# Patient Record
Sex: Male | Born: 1940 | Race: Black or African American | Hispanic: No | Marital: Married | Smoking: Never smoker
Health system: Southern US, Community
[De-identification: ages and names within clinical notes are randomized; demographics above are authoritative.]

## PROBLEM LIST (undated history)

## (undated) DIAGNOSIS — M159 Polyosteoarthritis, unspecified: Secondary | ICD-10-CM

## (undated) DIAGNOSIS — R531 Weakness: Secondary | ICD-10-CM

## (undated) DIAGNOSIS — M150 Primary generalized (osteo)arthritis: Secondary | ICD-10-CM

## (undated) DIAGNOSIS — E785 Hyperlipidemia, unspecified: Secondary | ICD-10-CM

## (undated) DIAGNOSIS — E876 Hypokalemia: Secondary | ICD-10-CM

## (undated) DIAGNOSIS — N400 Enlarged prostate without lower urinary tract symptoms: Secondary | ICD-10-CM

## (undated) DIAGNOSIS — I10 Essential (primary) hypertension: Secondary | ICD-10-CM

## (undated) DIAGNOSIS — I639 Cerebral infarction, unspecified: Secondary | ICD-10-CM

## (undated) DIAGNOSIS — M8949 Other hypertrophic osteoarthropathy, multiple sites: Secondary | ICD-10-CM

## (undated) DIAGNOSIS — R9431 Abnormal electrocardiogram [ECG] [EKG]: Secondary | ICD-10-CM

## (undated) DIAGNOSIS — I517 Cardiomegaly: Secondary | ICD-10-CM

## (undated) DIAGNOSIS — G459 Transient cerebral ischemic attack, unspecified: Secondary | ICD-10-CM

## (undated) DIAGNOSIS — E7800 Pure hypercholesterolemia, unspecified: Secondary | ICD-10-CM

## (undated) DIAGNOSIS — E119 Type 2 diabetes mellitus without complications: Secondary | ICD-10-CM

## (undated) DIAGNOSIS — D239 Other benign neoplasm of skin, unspecified: Secondary | ICD-10-CM

## (undated) HISTORY — DX: Other benign neoplasm of skin, unspecified: D23.9

## (undated) HISTORY — DX: Type 2 diabetes mellitus without complications: E11.9

## (undated) HISTORY — DX: Cerebral infarction, unspecified: I63.9

## (undated) HISTORY — DX: Weakness: R53.1

## (undated) HISTORY — DX: Pure hypercholesterolemia, unspecified: E78.00

## (undated) HISTORY — DX: Cardiomegaly: I51.7

## (undated) HISTORY — DX: Polyosteoarthritis, unspecified: M15.9

## (undated) HISTORY — DX: Hyperlipidemia, unspecified: E78.5

## (undated) HISTORY — DX: Benign prostatic hyperplasia without lower urinary tract symptoms: N40.0

## (undated) HISTORY — DX: Primary generalized (osteo)arthritis: M15.0

## (undated) HISTORY — DX: Other hypertrophic osteoarthropathy, multiple sites: M89.49

## (undated) HISTORY — DX: Abnormal electrocardiogram (ECG) (EKG): R94.31

## (undated) HISTORY — DX: Transient cerebral ischemic attack, unspecified: G45.9

## (undated) HISTORY — DX: Hypokalemia: E87.6

---

## 1999-08-12 ENCOUNTER — Encounter: Admission: RE | Admit: 1999-08-12 | Discharge: 1999-08-12 | Payer: Self-pay | Admitting: Specialist

## 1999-08-12 ENCOUNTER — Encounter: Payer: Self-pay | Admitting: Specialist

## 1999-10-06 ENCOUNTER — Ambulatory Visit (HOSPITAL_BASED_OUTPATIENT_CLINIC_OR_DEPARTMENT_OTHER): Admission: RE | Admit: 1999-10-06 | Discharge: 1999-10-06 | Payer: Self-pay | Admitting: Specialist

## 2001-09-30 ENCOUNTER — Encounter: Payer: Self-pay | Admitting: Urology

## 2001-09-30 ENCOUNTER — Encounter: Admission: RE | Admit: 2001-09-30 | Discharge: 2001-09-30 | Payer: Self-pay | Admitting: Urology

## 2001-11-02 ENCOUNTER — Encounter: Payer: Self-pay | Admitting: Urology

## 2001-11-08 ENCOUNTER — Inpatient Hospital Stay (HOSPITAL_COMMUNITY): Admission: RE | Admit: 2001-11-08 | Discharge: 2001-11-11 | Payer: Self-pay | Admitting: Urology

## 2001-11-11 ENCOUNTER — Encounter: Payer: Self-pay | Admitting: Urology

## 2012-11-05 ENCOUNTER — Emergency Department (HOSPITAL_BASED_OUTPATIENT_CLINIC_OR_DEPARTMENT_OTHER)
Admission: EM | Admit: 2012-11-05 | Discharge: 2012-11-05 | Disposition: A | Discharge status: Home / Self Care | Payer: BC Managed Care – PPO | Attending: Emergency Medicine | Admitting: Emergency Medicine

## 2012-11-05 ENCOUNTER — Encounter (HOSPITAL_BASED_OUTPATIENT_CLINIC_OR_DEPARTMENT_OTHER): Payer: Self-pay | Admitting: *Deleted

## 2012-11-05 ENCOUNTER — Emergency Department (HOSPITAL_BASED_OUTPATIENT_CLINIC_OR_DEPARTMENT_OTHER): Payer: BC Managed Care – PPO

## 2012-11-05 DIAGNOSIS — R209 Unspecified disturbances of skin sensation: Secondary | ICD-10-CM | POA: Insufficient documentation

## 2012-11-05 DIAGNOSIS — Z79899 Other long term (current) drug therapy: Secondary | ICD-10-CM | POA: Insufficient documentation

## 2012-11-05 DIAGNOSIS — E119 Type 2 diabetes mellitus without complications: Secondary | ICD-10-CM | POA: Insufficient documentation

## 2012-11-05 DIAGNOSIS — M5430 Sciatica, unspecified side: Secondary | ICD-10-CM | POA: Insufficient documentation

## 2012-11-05 DIAGNOSIS — I10 Essential (primary) hypertension: Secondary | ICD-10-CM | POA: Insufficient documentation

## 2012-11-05 HISTORY — DX: Essential (primary) hypertension: I10

## 2012-11-05 HISTORY — DX: Type 2 diabetes mellitus without complications: E11.9

## 2012-11-05 MED ORDER — OXYCODONE-ACETAMINOPHEN 5-325 MG PO TABS: 1.0000 | ORAL_TABLET | ORAL | Status: DC | PRN

## 2012-11-05 MED ORDER — IBUPROFEN 600 MG PO TABS: 600.0000 mg | ORAL_TABLET | Freq: Four times a day (QID) | ORAL | Status: DC | PRN

## 2012-11-05 MED ORDER — OXYCODONE-ACETAMINOPHEN 5-325 MG PO TABS
1.0000 | ORAL_TABLET | Freq: Once | ORAL | Status: AC
  Administered 2012-11-05: 1 via ORAL
  Filled 2012-11-05 (×2): qty 1

## 2012-11-05 NOTE — ED Notes (Signed)
Right hip pain since Thursday. No known injury.

## 2012-11-05 NOTE — ED Provider Notes (Signed)
I reviewed the resident's note and I agree with the findings and plan.    Lymph: No adenopathy   Nelia Shi, MD 11/05/12 2303

## 2012-11-05 NOTE — ED Provider Notes (Signed)
History     CSN: [id number]  Arrival date & time 11/05/12  1956   First MD Initiated Contact with Patient 11/05/12 2018      Chief Complaint  Patient presents with  . Hip Pain    HPI Comments: Pt noticed acute onset of pain in right hip 2 days ago while standing from prolonged sitting position at work. He states it started with pain in the hip, but when he walked he noticed pain going down posterior leg to his calf. He endorses some numbness of his great toe, but otherwise no numbness or tingling. No loss of bowel or bladder. He has never had this before. No known injury or new activity.   Patient is a 72 y.o. male presenting with hip pain.  Hip Pain This is a new problem. Episode onset: 2 days ago. The problem occurs constantly. The problem has been unchanged. Associated symptoms include arthralgias and myalgias. Pertinent negatives include no abdominal pain, change in bowel habit, chest pain, diaphoresis, fever, headaches, joint swelling, nausea, neck pain or rash. Associated symptoms comments: Pt endorses pain that radiates down his right leg to his calf. Pain is worst in calf with ambulation. The symptoms are aggravated by standing and walking. He has tried NSAIDs for the symptoms. The treatment provided no relief.    Past Medical History  Diagnosis Date  . Hypertension   . Diabetes mellitus without complication     History reviewed. No pertinent past surgical history.  History reviewed. No pertinent family history.  History  Substance Use Topics  . Smoking status: Never Smoker   . Smokeless tobacco: Not on file  . Alcohol Use: No    Review of Systems  Constitutional: Negative for fever and diaphoresis.  HENT: Negative for neck pain and neck stiffness.   Eyes: Negative for visual disturbance.  Respiratory: Negative for chest tightness and shortness of breath.   Cardiovascular: Negative for chest pain.  Gastrointestinal: Negative for nausea, abdominal pain and change in  bowel habit.  Genitourinary: Negative for difficulty urinating.  Musculoskeletal: Positive for myalgias, arthralgias and gait problem. Negative for joint swelling.  Skin: Negative for rash.  Neurological: Negative for headaches.  All other systems reviewed and are negative.    Allergies  Shellfish allergy; Neosporin; and Zinc  Home Medications   Current Outpatient Rx  Name  Route  Sig  Dispense  Refill  . glipiZIDE (GLUCOTROL) 5 MG tablet   Oral   Take 5 mg by mouth 2 (two) times daily before a meal.         . losartan (COZAAR) 50 MG tablet   Oral   Take 50 mg by mouth daily.         . metFORMIN (GLUMETZA) 1000 MG (MOD) 24 hr tablet   Oral   Take 1,000 mg by mouth daily with breakfast.         . metoprolol succinate (TOPROL-XL) 100 MG 24 hr tablet   Oral   Take 100 mg by mouth daily. Take with or immediately following a meal.         . ibuprofen (ADVIL,MOTRIN) 600 MG tablet   Oral   Take 1 tablet (600 mg total) by mouth every 6 (six) hours as needed for pain.   30 tablet   0   . oxyCODONE-acetaminophen (PERCOCET/ROXICET) 5-325 MG per tablet   Oral   Take 1 tablet by mouth every 4 (four) hours as needed for pain.   10 tablet   0  BP 199/103  Pulse 80  Temp(Src) 98.7 F (37.1 C) (Oral)  Resp 20  Ht 5\' 10"  (1.778 m)  Wt 205 lb (92.987 kg)  BMI 29.41 kg/m2  SpO2 95%  Physical Exam  Constitutional: He is oriented to person, place, and time. He appears well-developed and well-nourished. No distress.  On left side in stretcher  HENT:  Head: Normocephalic and atraumatic.  Eyes: Pupils are equal, round, and reactive to light.  Neck: Normal range of motion.  Cardiovascular: Normal rate, regular rhythm and normal heart sounds.   Pulmonary/Chest: Effort normal and breath sounds normal.  Abdominal: Soft. There is no tenderness.  Musculoskeletal:       Right hip: He exhibits bony tenderness. He exhibits normal range of motion, no swelling and no  deformity.       Right knee: He exhibits normal range of motion. No tenderness found.       Right ankle: Normal. He exhibits normal range of motion.       Lumbar back: He exhibits no tenderness.  No calf tenderness. Neg Homans  Lymphadenopathy:    He has no cervical adenopathy.  Neurological: He is alert and oriented to person, place, and time. He has normal strength. No cranial nerve deficit or sensory deficit.    ED Course  Procedures (including critical care time)  Labs Reviewed - No data to display Dg Lumbar Spine Complete  11/05/2012  *RADIOLOGY REPORT*  Clinical Data: Right hip pain  LUMBAR SPINE - COMPLETE 4+ VIEW  Comparison: None.  Findings: Five lumbar-type vertebral bodies.  Mild straightening of the lumbar spine.  No evidence of fracture or dislocation.  The vertebral body heights are maintained.  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  Visualized bony pelvis appears intact.  IMPRESSION: No fracture or dislocation is seen.  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   Original Report Authenticated By: Charline Bills, M.D.     1. Sciatic nerve pain     MDM  72 yo M with onset of right hip and leg pain  2040- Patient seen and examined. DDx includes lumbar spine abnormality, right hip injury, DVT or sciatic pain. DVT less likely given no calf tenderness on exam and also his pain is his entire leg including his hip. Will image lumbar spine. Pt declines any pain medication at this time.  2152- X-ray reviewed. Shows degenerative changes but no fracture or evidence of lytic lesions. Most likely due to sciatic pain. Will give one dose of Percocet prior to discharge. He should follow up with his PCP.  2200- Reviewed results with patient and wife. Will prescribe Percocet #10 to use as needed, and Ibuprofen 600mg  which he should take for next 3 days to help with inflammation, then as needed afterwards. Also given some stretching  exercises to do once his pain has improved. Discussed with Dr. Radford Pax.   Hilarie Fredrickson, MD 11/05/12 2202

## 2013-12-18 ENCOUNTER — Other Ambulatory Visit: Payer: Self-pay | Admitting: Dermatology

## 2016-01-05 DIAGNOSIS — H40013 Open angle with borderline findings, low risk, bilateral: Secondary | ICD-10-CM | POA: Insufficient documentation

## 2016-01-05 DIAGNOSIS — E113299 Type 2 diabetes mellitus with mild nonproliferative diabetic retinopathy without macular edema, unspecified eye: Secondary | ICD-10-CM | POA: Insufficient documentation

## 2016-01-05 DIAGNOSIS — H269 Unspecified cataract: Secondary | ICD-10-CM | POA: Insufficient documentation

## 2016-03-24 ENCOUNTER — Emergency Department (HOSPITAL_COMMUNITY): Payer: Medicare Other

## 2016-03-24 ENCOUNTER — Encounter (HOSPITAL_COMMUNITY): Payer: Self-pay | Admitting: Emergency Medicine

## 2016-03-24 ENCOUNTER — Observation Stay (HOSPITAL_COMMUNITY)
Admission: EM | Admit: 2016-03-24 | Discharge: 2016-03-26 | Disposition: A | Discharge status: Home / Self Care | Payer: Medicare Other | Attending: Internal Medicine | Admitting: Internal Medicine

## 2016-03-24 DIAGNOSIS — I161 Hypertensive emergency: Secondary | ICD-10-CM | POA: Insufficient documentation

## 2016-03-24 DIAGNOSIS — I639 Cerebral infarction, unspecified: Secondary | ICD-10-CM

## 2016-03-24 DIAGNOSIS — R531 Weakness: Secondary | ICD-10-CM

## 2016-03-24 DIAGNOSIS — N400 Enlarged prostate without lower urinary tract symptoms: Secondary | ICD-10-CM | POA: Insufficient documentation

## 2016-03-24 DIAGNOSIS — E119 Type 2 diabetes mellitus without complications: Secondary | ICD-10-CM

## 2016-03-24 DIAGNOSIS — E876 Hypokalemia: Secondary | ICD-10-CM | POA: Diagnosis not present

## 2016-03-24 DIAGNOSIS — R9431 Abnormal electrocardiogram [ECG] [EKG]: Secondary | ICD-10-CM | POA: Diagnosis not present

## 2016-03-24 DIAGNOSIS — I10 Essential (primary) hypertension: Secondary | ICD-10-CM | POA: Diagnosis not present

## 2016-03-24 DIAGNOSIS — M6289 Other specified disorders of muscle: Secondary | ICD-10-CM | POA: Diagnosis not present

## 2016-03-24 DIAGNOSIS — Z888 Allergy status to other drugs, medicaments and biological substances status: Secondary | ICD-10-CM | POA: Insufficient documentation

## 2016-03-24 DIAGNOSIS — Z91013 Allergy to seafood: Secondary | ICD-10-CM | POA: Insufficient documentation

## 2016-03-24 DIAGNOSIS — Z7984 Long term (current) use of oral hypoglycemic drugs: Secondary | ICD-10-CM | POA: Insufficient documentation

## 2016-03-24 DIAGNOSIS — R0989 Other specified symptoms and signs involving the circulatory and respiratory systems: Secondary | ICD-10-CM | POA: Insufficient documentation

## 2016-03-24 DIAGNOSIS — I083 Combined rheumatic disorders of mitral, aortic and tricuspid valves: Secondary | ICD-10-CM | POA: Diagnosis not present

## 2016-03-24 DIAGNOSIS — J341 Cyst and mucocele of nose and nasal sinus: Secondary | ICD-10-CM | POA: Insufficient documentation

## 2016-03-24 DIAGNOSIS — G459 Transient cerebral ischemic attack, unspecified: Principal | ICD-10-CM

## 2016-03-24 DIAGNOSIS — Z8673 Personal history of transient ischemic attack (TIA), and cerebral infarction without residual deficits: Secondary | ICD-10-CM | POA: Diagnosis not present

## 2016-03-24 DIAGNOSIS — E1159 Type 2 diabetes mellitus with other circulatory complications: Secondary | ICD-10-CM | POA: Insufficient documentation

## 2016-03-24 HISTORY — DX: Transient cerebral ischemic attack, unspecified: G45.9

## 2016-03-24 HISTORY — DX: Cerebral infarction, unspecified: I63.9

## 2016-03-24 HISTORY — DX: Benign prostatic hyperplasia without lower urinary tract symptoms: N40.0

## 2016-03-24 HISTORY — DX: Weakness: R53.1

## 2016-03-24 LAB — APTT: aPTT: 29 seconds (ref 24–37)

## 2016-03-24 LAB — I-STAT CHEM 8, ED
BUN: 15 mg/dL (ref 6–20)
CALCIUM ION: 1.09 mmol/L — AB (ref 1.12–1.23)
CHLORIDE: 100 mmol/L — AB (ref 101–111)
Creatinine, Ser: 1.3 mg/dL — ABNORMAL HIGH (ref 0.61–1.24)
Glucose, Bld: 107 mg/dL — ABNORMAL HIGH (ref 65–99)
HCT: 36 % — ABNORMAL LOW (ref 39.0–52.0)
Hemoglobin: 12.2 g/dL — ABNORMAL LOW (ref 13.0–17.0)
Potassium: 3 mmol/L — ABNORMAL LOW (ref 3.5–5.1)
SODIUM: 141 mmol/L (ref 135–145)
TCO2: 28 mmol/L (ref 0–100)

## 2016-03-24 LAB — CBC
HCT: 34 % — ABNORMAL LOW (ref 39.0–52.0)
Hemoglobin: 11.6 g/dL — ABNORMAL LOW (ref 13.0–17.0)
MCH: 28.4 pg (ref 26.0–34.0)
MCHC: 34.1 g/dL (ref 30.0–36.0)
MCV: 83.1 fL (ref 78.0–100.0)
PLATELETS: 236 10*3/uL (ref 150–400)
RBC: 4.09 MIL/uL — ABNORMAL LOW (ref 4.22–5.81)
RDW: 13.8 % (ref 11.5–15.5)
WBC: 6.8 10*3/uL (ref 4.0–10.5)

## 2016-03-24 LAB — COMPREHENSIVE METABOLIC PANEL
ALBUMIN: 4.3 g/dL (ref 3.5–5.0)
ALT: 16 U/L — ABNORMAL LOW (ref 17–63)
ANION GAP: 8 (ref 5–15)
AST: 21 U/L (ref 15–41)
Alkaline Phosphatase: 57 U/L (ref 38–126)
BILIRUBIN TOTAL: 0.4 mg/dL (ref 0.3–1.2)
BUN: 16 mg/dL (ref 6–20)
CO2: 27 mmol/L (ref 22–32)
Calcium: 8.6 mg/dL — ABNORMAL LOW (ref 8.9–10.3)
Chloride: 103 mmol/L (ref 101–111)
Creatinine, Ser: 1.42 mg/dL — ABNORMAL HIGH (ref 0.61–1.24)
GFR calc Af Amer: 55 mL/min — ABNORMAL LOW (ref >60)
GFR calc non Af Amer: 47 mL/min — ABNORMAL LOW (ref >60)
GLUCOSE: 109 mg/dL — AB (ref 65–99)
POTASSIUM: 2.9 mmol/L — AB (ref 3.5–5.1)
SODIUM: 138 mmol/L (ref 135–145)
TOTAL PROTEIN: 8 g/dL (ref 6.5–8.1)

## 2016-03-24 LAB — DIFFERENTIAL
Basophils Absolute: 0 10*3/uL (ref 0.0–0.1)
Basophils Relative: 0 %
EOS ABS: 0.3 10*3/uL (ref 0.0–0.7)
EOS PCT: 5 %
Lymphocytes Relative: 38 %
Lymphs Abs: 2.6 10*3/uL (ref 0.7–4.0)
Monocytes Absolute: 0.5 10*3/uL (ref 0.1–1.0)
Monocytes Relative: 7 %
NEUTROS PCT: 50 %
Neutro Abs: 3.4 10*3/uL (ref 1.7–7.7)

## 2016-03-24 LAB — CBG MONITORING, ED: GLUCOSE-CAPILLARY: 110 mg/dL — AB (ref 65–99)

## 2016-03-24 LAB — I-STAT TROPONIN, ED: Troponin i, poc: 0 ng/mL (ref 0.00–0.08)

## 2016-03-24 LAB — PROTIME-INR
INR: 1.06 (ref 0.00–1.49)
PROTHROMBIN TIME: 13.6 s (ref 11.6–15.2)

## 2016-03-24 LAB — GLUCOSE, CAPILLARY: Glucose-Capillary: 91 mg/dL (ref 65–99)

## 2016-03-24 MED ORDER — INSULIN ASPART 100 UNIT/ML ~~LOC~~ SOLN: 1.0000 [IU] | SUBCUTANEOUS | Status: DC

## 2016-03-24 MED ORDER — METOPROLOL SUCCINATE ER 25 MG PO TB24
25.0000 mg | ORAL_TABLET | Freq: Two times a day (BID) | ORAL | Status: DC
  Administered 2016-03-25: 25 mg via ORAL
  Filled 2016-03-24: qty 1

## 2016-03-24 MED ORDER — ACETAMINOPHEN 650 MG RE SUPP: 650.0000 mg | RECTAL | Status: DC | PRN

## 2016-03-24 MED ORDER — STROKE: EARLY STAGES OF RECOVERY BOOK
Freq: Once | Status: AC
  Administered 2016-03-24: 23:00:00
  Filled 2016-03-24: qty 1

## 2016-03-24 MED ORDER — METFORMIN HCL ER 500 MG PO TB24
1000.0000 mg | ORAL_TABLET | Freq: Every day | ORAL | Status: DC
  Administered 2016-03-25: 1000 mg via ORAL
  Filled 2016-03-24: qty 2

## 2016-03-24 MED ORDER — ACETAMINOPHEN 325 MG PO TABS: 650.0000 mg | ORAL_TABLET | ORAL | Status: DC | PRN

## 2016-03-24 MED ORDER — LOSARTAN POTASSIUM 25 MG PO TABS
25.0000 mg | ORAL_TABLET | Freq: Two times a day (BID) | ORAL | Status: DC
  Administered 2016-03-25 (×2): 25 mg via ORAL
  Filled 2016-03-24 (×2): qty 1

## 2016-03-24 MED ORDER — SODIUM CHLORIDE 0.9 % IV SOLN
INTRAVENOUS | Status: AC
  Administered 2016-03-25: via INTRAVENOUS

## 2016-03-24 MED ORDER — ASPIRIN 325 MG PO TABS
325.0000 mg | ORAL_TABLET | Freq: Once | ORAL | Status: AC
  Administered 2016-03-24: 325 mg via ORAL
  Filled 2016-03-24: qty 1

## 2016-03-24 MED ORDER — SODIUM CHLORIDE 0.9 % IV SOLN
INTRAVENOUS | Status: DC
  Administered 2016-03-25: 08:00:00 via INTRAVENOUS

## 2016-03-24 NOTE — Consult Note (Signed)
Admission H&P    Chief Complaint: Transient episodes of left-sided weakness and numbness.  HPI: Michael Hudson is an 75 y.o. male with a history of hypertension and diabetes mellitus brought to the emergency room for evaluation of recurrent side weakness. Initial episode occurred around noon today weakness and clumsiness of his left arm which subsequently resolved. He later had several episodes of disequilibrium with falling towards the left side. Around 6:30 PM he had an episode of left arm and leg weakness as well as left facial droop. EMS was called and he was brought to the emergency room at [facility]. Deficits had resolved by the time he arrived and have not recurred. He was given aspirin in the ED. CT scan of his head showed no acute intracranial abnormality. He has no history of stroke nor TIA. NIH stroke score at the time of this evaluation was 0.  LSN: 6:30 PM on 03/24/2016 tPA Given: No: Deficits resolved mRankin:  Past Medical History  Diagnosis Date  . Hypertension   . Diabetes mellitus without complication ([place])   . Enlarged prostate     History reviewed. No pertinent past surgical history.  No family history on file. Social History:  reports that he has never smoked. He does not have any smokeless tobacco history on file. He reports that he does not drink alcohol or use illicit drugs.  Allergies:  Allergies  Allergen Reactions  . Shellfish Allergy Nausea And Vomiting  . Neosporin [Neomycin-Bacitracin Zn-Polymyx] Rash  . Zinc Rash    Medications Prior to Admission  Medication Sig Dispense Refill  . glipiZIDE (GLUCOTROL XL) 10 MG 24 hr tablet Take 10 mg by mouth daily.    Marland Kitchen losartan (COZAAR) 100 MG tablet Take 100 mg by mouth daily.    . metFORMIN (GLUMETZA) 1000 MG (MOD) 24 hr tablet Take 1,000 mg by mouth daily with breakfast.    . metoprolol succinate (TOPROL-XL) 100 MG 24 hr tablet Take 100 mg by mouth daily. Take with or immediately following a meal.       ROS: History obtained from the patient  General ROS: negative for - chills, fatigue, fever, night sweats, weight gain or weight loss Psychological ROS: negative for - behavioral disorder, hallucinations, memory difficulties, mood swings or suicidal ideation Ophthalmic ROS: negative for - blurry vision, double vision, eye pain or loss of vision ENT ROS: negative for - epistaxis, nasal discharge, oral lesions, sore throat, tinnitus or vertigo Allergy and Immunology ROS: negative for - hives or itchy/watery eyes Hematological and Lymphatic ROS: negative for - bleeding problems, bruising or swollen lymph nodes Endocrine ROS: negative for - galactorrhea, hair pattern changes, polydipsia/polyuria or temperature intolerance Respiratory ROS: negative for - cough, hemoptysis, shortness of breath or wheezing Cardiovascular ROS: negative for - chest pain, dyspnea on exertion, edema or irregular heartbeat Gastrointestinal ROS: negative for - abdominal pain, diarrhea, hematemesis, nausea/vomiting or stool incontinence Genito-Urinary ROS: negative for - dysuria, hematuria, incontinence or urinary frequency/urgency Musculoskeletal ROS: negative for - joint swelling or muscular weakness Neurological ROS: as noted in HPI Dermatological ROS: negative for rash and skin lesion changes  Physical Examination: Blood pressure 191/101, pulse 67, temperature 97.7 F (36.5 C), temperature source Oral, resp. rate 16, height 5' 9.5" (1.765 m), weight 90.719 kg (200 lb), SpO2 99 %.  HEENT-  Normocephalic, no lesions, without obvious abnormality.  Normal external eye and conjunctiva.  Normal TM's bilaterally.  Normal auditory canals and external ears. Normal external nose, mucus membranes and septum.  Normal  pharynx. Neck supple with no masses, nodes, nodules or enlargement. Cardiovascular - regular rate and rhythm, S1, S2 normal, no murmur, click, rub or gallop Lungs - chest clear, no wheezing, rales, normal  symmetric air entry Abdomen - soft, non-tender; bowel sounds normal; no masses,  no organomegaly Extremities - no joint deformities, effusion, or inflammation and no edema  Neurologic Examination: Mental Status: Alert, oriented, no acute distress.  Speech fluent without evidence of aphasia. Able to follow commands without difficulty. Cranial Nerves: II-Visual fields were normal. III/IV/VI-Pupils were equal and reacted normally to light. Extraocular movements were full and conjugate.    V/VII-no facial numbness and no facial weakness. VIII-normal. X-normal speech and symmetrical palatal movement. XI: trapezius strength/neck flexion strength normal bilaterally XII-midline tongue extension with normal strength. Motor: 5/5 bilaterally with normal tone and bulk Sensory: Normal throughout. Deep Tendon Reflexes: 1+ and symmetric. Plantars: Flexor bilaterally Cerebellar: Normal finger-to-nose testing. Carotid auscultation: Normal  Results for orders placed or performed during the hospital encounter of 03/24/16 (from the past 48 hour(s))  Protime-INR     Status: None   Collection Time: 03/24/16  8:46 PM  Result Value Ref Range   Prothrombin Time 13.6 11.6 - 15.2 seconds   INR 1.06 0.00 - 1.49  APTT     Status: None   Collection Time: 03/24/16  8:46 PM  Result Value Ref Range   aPTT 29 24 - 37 seconds  CBC     Status: Abnormal   Collection Time: 03/24/16  8:46 PM  Result Value Ref Range   WBC 6.8 4.0 - 10.5 K/uL   RBC 4.09 (L) 4.22 - 5.81 MIL/uL   Hemoglobin 11.6 (L) 13.0 - 17.0 g/dL   HCT 34.0 (L) 39.0 - 52.0 %   MCV 83.1 78.0 - 100.0 fL   MCH 28.4 26.0 - 34.0 pg   MCHC 34.1 30.0 - 36.0 g/dL   RDW 13.8 11.5 - 15.5 %   Platelets 236 150 - 400 K/uL  Differential     Status: None   Collection Time: 03/24/16  8:46 PM  Result Value Ref Range   Neutrophils Relative % 50 %   Neutro Abs 3.4 1.7 - 7.7 K/uL   Lymphocytes Relative 38 %   Lymphs Abs 2.6 0.7 - 4.0 K/uL   Monocytes  Relative 7 %   Monocytes Absolute 0.5 0.1 - 1.0 K/uL   Eosinophils Relative 5 %   Eosinophils Absolute 0.3 0.0 - 0.7 K/uL   Basophils Relative 0 %   Basophils Absolute 0.0 0.0 - 0.1 K/uL  Comprehensive metabolic panel     Status: Abnormal   Collection Time: 03/24/16  8:46 PM  Result Value Ref Range   Sodium 138 135 - 145 mmol/L   Potassium 2.9 (L) 3.5 - 5.1 mmol/L   Chloride 103 101 - 111 mmol/L   CO2 27 22 - 32 mmol/L   Glucose, Bld 109 (H) 65 - 99 mg/dL   BUN 16 6 - 20 mg/dL   Creatinine, Ser 1.42 (H) 0.61 - 1.24 mg/dL   Calcium 8.6 (L) 8.9 - 10.3 mg/dL   Total Protein 8.0 6.5 - 8.1 g/dL   Albumin 4.3 3.5 - 5.0 g/dL   AST 21 15 - 41 U/L   ALT 16 (L) 17 - 63 U/L   Alkaline Phosphatase 57 38 - 126 U/L   Total Bilirubin 0.4 0.3 - 1.2 mg/dL   GFR calc non Af Amer 47 (L) >60 mL/min   GFR calc Af Wyvonnia Lora  55 (L) >60 mL/min    Comment: (NOTE) The eGFR has been calculated using the CKD EPI equation. This calculation has not been validated in all clinical situations. eGFR's persistently <60 mL/min signify possible Chronic Kidney Disease.    Anion gap 8 5 - 15  CBG monitoring, ED     Status: Abnormal   Collection Time: 03/24/16  8:49 PM  Result Value Ref Range   Glucose-Capillary 110 (H) 65 - 99 mg/dL  I-Stat Chem 8, ED     Status: Abnormal   Collection Time: 03/24/16  8:57 PM  Result Value Ref Range   Sodium 141 135 - 145 mmol/L   Potassium 3.0 (L) 3.5 - 5.1 mmol/L   Chloride 100 (L) 101 - 111 mmol/L   BUN 15 6 - 20 mg/dL   Creatinine, Ser 1.30 (H) 0.61 - 1.24 mg/dL   Glucose, Bld 107 (H) 65 - 99 mg/dL   Calcium, Ion 1.09 (L) 1.12 - 1.23 mmol/L   TCO2 28 0 - 100 mmol/L   Hemoglobin 12.2 (L) 13.0 - 17.0 g/dL   HCT 36.0 (L) 39.0 - 52.0 %  I-stat troponin, ED     Status: None   Collection Time: 03/24/16  9:20 PM  Result Value Ref Range   Troponin i, poc 0.00 0.00 - 0.08 ng/mL   Comment 3            Comment: Due to the release kinetics of cTnI, a negative result within the  first hours of the onset of symptoms does not rule out myocardial infarction with certainty. If myocardial infarction is still suspected, repeat the test at appropriate intervals.    Ct Head Code Stroke W/o Cm  03/24/2016  CLINICAL DATA:  Stroke like symptoms EXAM: CT HEAD WITHOUT CONTRAST TECHNIQUE: Contiguous axial images were obtained from the base of the skull through the vertex without intravenous contrast. COMPARISON:  None. FINDINGS: Generalized atrophy. Hypodensity in the periventricular white matter bilaterally consistent with chronic microvascular ischemia. Small chronic lacunar infarction right putamen. No acute infarct. Negative for hemorrhage or mass. Negative calvarium Mucosal cyst in the right maxillary sinus. IMPRESSION: Atrophy and chronic microvascular ischemia. No acute infarct identified. These results were called by telephone at the time of interpretation on 03/24/2016 at 8:46 pm to Dr. Charlesetta Shanks , who verbally acknowledged these results. Electronically Signed   By: Franchot Gallo M.D.   On: 03/24/2016 20:49    Assessment: 75 y.o. 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 Patient is asymptomatic at this point and has no clinical deficits.  Stroke Risk Factors - diabetes mellitus and hypertension  Plan: 1. HgbA1c, fasting lipid panel 2. MRI, MRA  of the brain without contrast 3. PT consult, OT consult, Speech consult 4. Echocardiogram 5. Carotid dopplers 6. Prophylactic therapy-Antiplatelet med: Aspirin  7. Risk factor modification 8. Telemetry monitoring 9. We'll activate code stroke of patient's deficits recur  C.R. Nicole Kindred, MD [facility] [phone]  03/24/2016, 10:59 PM

## 2016-03-24 NOTE — ED Notes (Signed)
[facility] at bedside 

## 2016-03-24 NOTE — ED Notes (Signed)
Pt brought to ED POV by family. Pt states after leaving dentist today @ [phone] pt had episode of weakness to L arm, pt states this resolved on the way home. Pt had episode at home with loss of balance, fell to the ground. Pt states he layed down, got back up this afternoon with had several episodes that included ataxia and falling. Per family, "his face twisted", no use of L arm and dragging L leg. Daughter states pt dropped cup he was holding in L hand. Per daughter last episode @ 1915, enroute to ED. No neuro deficits on exam. Pt A & O.

## 2016-03-24 NOTE — ED Notes (Signed)
Patient transported to CT 

## 2016-03-24 NOTE — ED Provider Notes (Signed)
CSN: [id number]     Arrival date & time 03/24/16  2023 History   First MD Initiated Contact with Patient 03/24/16 2026     Chief Complaint  Patient presents with  . Stroke Symptoms     (Consider location/radiation/quality/duration/timing/severity/associated sxs/prior Treatment) HPI The patient had a dental appointment in the morning. He reports after he left the dentist office and he was driving he suddenly noticed that his left arm wasn't working the way it normally did. This first symptom occurred at 12:45 PM. He got home and was having some balance difficulty, needing to use a walls and furniture to keep from falling down. This symptom resolved completely. He then had another episode several hours later. 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 He couldn't identify if it was specifically one leg or the other. He then managed to sit at the dugout during the game and leaned over to pick something up from the ground, he lost his balance and fell forward. Subsequent to this at 7 PM members report that he was distinctly dragging his left leg and his left arm was not working normally. He also noted facial asymmetry. Patient denies ever having any headache or pain associated with these episodes. This is again resolved since arriving to the emergency department. P  Past Medical History  Diagnosis Date  . Hypertension   . Diabetes mellitus without complication ([place])   . Enlarged prostate    History reviewed. No pertinent past surgical history. No family history on file. Social History  Substance Use Topics  . Smoking status: Never Smoker   . Smokeless tobacco: None  . Alcohol Use: No    Review of Systems  10 Systems reviewed and are negative for acute change except as noted in the HPI.   Allergies  Shellfish allergy; Neosporin; and Zinc  Home Medications   Prior to Admission medications   Medication Sig  Start Date End Date Taking? Authorizing Provider  glipiZIDE (GLUCOTROL XL) 10 MG 24 hr tablet Take 10 mg by mouth daily. 01/03/16  Yes Historical Provider, MD  losartan (COZAAR) 100 MG tablet Take 100 mg by mouth daily.   Yes Historical Provider, MD  metFORMIN (GLUMETZA) 1000 MG (MOD) 24 hr tablet Take 1,000 mg by mouth daily with breakfast.   Yes Historical Provider, MD  metoprolol succinate (TOPROL-XL) 100 MG 24 hr tablet Take 100 mg by mouth daily. Take with or immediately following a meal.   Yes Historical Provider, MD  amLODipine (NORVASC) 5 MG tablet Take 1 tablet (5 mg total) by mouth daily. 03/26/16   Eugenie Filler, MD  aspirin 325 MG tablet Take 1 tablet (325 mg total) by mouth daily. 03/26/16   Eugenie Filler, MD  atorvastatin (LIPITOR) 20 MG tablet Take 1 tablet (20 mg total) by mouth daily at 6 PM. 03/26/16   Eugenie Filler, MD   BP 173/97 mmHg  Pulse 69  Temp(Src) 98.6 F (37 C) (Oral)  Resp 14  Ht 5' 9.5" (1.765 m)  Wt 200 lb (90.719 kg)  BMI 29.12 kg/m2  SpO2 98% Physical Exam  Constitutional: He is oriented to person, place, and time. He appears well-developed and well-nourished.  HENT:  Head: Normocephalic and atraumatic.  Eyes: EOM are normal. Pupils are equal, round, and reactive to light.  Neck: Neck supple.  Cardiovascular: Normal rate, regular rhythm, normal heart sounds and intact distal pulses.  Pulmonary/Chest: Effort normal and breath sounds normal.  Abdominal: Soft. Bowel sounds are normal. He exhibits no distension. There is no tenderness.  Musculoskeletal: Normal range of motion. He exhibits no edema or tenderness.  Neurological: He is alert and oriented to person, place, and time. He has normal strength. No cranial nerve deficit. He exhibits normal muscle tone. Coordination normal. GCS eye subscore is 4. GCS verbal subscore is 5. GCS motor subscore is 6.  At time of physical exam (20:25) patient has intact motor strength. Bilateral upper extremities  have 5\5 grip strength. No pronator drift. Patient can independently elevate each lower extremity off of the bed hold and hold against downward pressure. Normal flexion and extension strength in the feet. Normal finger-nose exam bilaterally. Cranial nerves intact. No visual field deficit. Speech is clear with normal content.  Skin: Skin is warm, dry and intact.  Psychiatric: He has a normal mood and affect.    ED Course  Procedures (including critical care time) CRITICAL CARE Performed by: Charlesetta Shanks   Total critical care time: 30 minutes  Critical care time was exclusive of separately billable procedures and treating other patients.  Critical care was necessary to treat or prevent imminent or life-threatening deterioration.  Critical care was time spent personally by me on the following activities: development of treatment plan with patient and/or surrogate as well as nursing, discussions with consultants, evaluation of patient's response to treatment, examination of patient, obtaining history from patient or surrogate, ordering and performing treatments and interventions, ordering and review of laboratory studies, ordering and review of radiographic studies, pulse oximetry and re-evaluation of patient's condition. Labs Review Labs Reviewed  CBC - Abnormal; Notable for the following:    RBC 4.09 (*)    Hemoglobin 11.6 (*)    HCT 34.0 (*)    All other components within normal limits  COMPREHENSIVE METABOLIC PANEL - Abnormal; Notable for the following:    Potassium 2.9 (*)    Glucose, Bld 109 (*)    Creatinine, Ser 1.42 (*)    Calcium 8.6 (*)    ALT 16 (*)    GFR calc non Af Amer 47 (*)    GFR calc Af Amer 55 (*)    All other components within normal limits  HEMOGLOBIN A1C - Abnormal; Notable for the following:    Hgb A1c MFr Bld 8.4 (*)    All other components within normal limits  LIPID PANEL - Abnormal; Notable for the following:    Triglycerides 186 (*)    HDL 32 (*)     LDL Cholesterol 123 (*)    All other components within normal limits  BASIC METABOLIC PANEL - Abnormal; Notable for the following:    Potassium 2.9 (*)    Glucose, Bld 195 (*)    Creatinine, Ser 1.35 (*)    Calcium 8.6 (*)    GFR calc non Af Amer 50 (*)    GFR calc Af Amer 58 (*)    All other components within normal limits  CBC - Abnormal; Notable for the following:    RBC 4.05 (*)    Hemoglobin 11.3 (*)    HCT 33.6 (*)    All other components within normal limits  MAGNESIUM - Abnormal; Notable for the following:    Magnesium 1.6 (*)    All other components within normal limits  GLUCOSE, CAPILLARY - Abnormal; Notable for the following:    Glucose-Capillary 240 (*)    All other components within normal limits  TROPONIN  I - Abnormal; Notable for the following:    Troponin I 0.03 (*)    All other components within normal limits  GLUCOSE, CAPILLARY - Abnormal; Notable for the following:    Glucose-Capillary 119 (*)    All other components within normal limits  BASIC METABOLIC PANEL - Abnormal; Notable for the following:    Glucose, Bld 173 (*)    Creatinine, Ser 1.32 (*)    Calcium 8.5 (*)    GFR calc non Af Amer 51 (*)    GFR calc Af Amer 60 (*)    All other components within normal limits  CBC - Abnormal; Notable for the following:    Hemoglobin 11.9 (*)    HCT 35.7 (*)    All other components within normal limits  GLUCOSE, CAPILLARY - Abnormal; Notable for the following:    Glucose-Capillary 145 (*)    All other components within normal limits  GLUCOSE, CAPILLARY - Abnormal; Notable for the following:    Glucose-Capillary 154 (*)    All other components within normal limits  GLUCOSE, CAPILLARY - Abnormal; Notable for the following:    Glucose-Capillary 177 (*)    All other components within normal limits  GLUCOSE, CAPILLARY - Abnormal; Notable for the following:    Glucose-Capillary 199 (*)    All other components within normal limits  CBG MONITORING, ED -  Abnormal; Notable for the following:    Glucose-Capillary 110 (*)    All other components within normal limits  I-STAT CHEM 8, ED - Abnormal; Notable for the following:    Potassium 3.0 (*)    Chloride 100 (*)    Creatinine, Ser 1.30 (*)    Glucose, Bld 107 (*)    Calcium, Ion 1.09 (*)    Hemoglobin 12.2 (*)    HCT 36.0 (*)    All other components within normal limits  MRSA PCR SCREENING  PROTIME-INR  APTT  DIFFERENTIAL  GLUCOSE, CAPILLARY  TROPONIN I  MAGNESIUM  I-STAT TROPOININ, ED    Imaging Review No results found. I have personally reviewed and evaluated these images and lab results as part of my medical decision-making.   EKG Interpretation   Date/Time:  Tuesday March 24 2016 20:26:37 EDT Ventricular Rate:  79 PR Interval:    QRS Duration: 93 QT Interval:  382 QTC Calculation: 438 R Axis:   75 Text Interpretation:  Sinus rhythm Abnormal T, probable ischemia,  widespread Minimal ST elevation, anterior leads Confirmed by Johnney Killian, MD,  Jeannie Done [phone]) on 03/24/2016 8:52:33 PM     Consultation: Reviewed to Dr. Nicole Kindred. He advises will treat with aspirin and transferred to [facility] for admission. If patient has a recurrence of CVA deficit we will initiate code stroke. MDM   Final diagnoses:  Suspected cerebrovascular accident (CVA) ([place])  Type 2 diabetes mellitus without complication, without long-term current use of insulin ([place])  Essential hypertension   Patient presents with stuttering symptoms of left-sided weakness and motor dysfunction. At the time of evaluation, all symptoms have resolved. Current NIH score is 0. He is neurologically intact with normal cognitive function. High concern however for risk of recurrence. Patient also is hypertensive with initial blood pressure of 206\107, although this has trended down without antihypertensive administration. At this time, will not treat hypertension based on suspected CVA, particularly as blood pressures are  trending downward without administration of medications and patient does not show signs of encephalopathy.    Charlesetta Shanks, MD 03/28/16 [phone]

## 2016-03-24 NOTE — H&P (Signed)
[facility] History and Physical  [name] [id number] DOB: [birth year] DOA: 03/24/2016  Referring physician: Dr. Johnney Killian PCP: No primary care provider on file.   Chief Complaint: Left sided weakness and dysequilibrium, off and on today  HPI: Michael Hudson is a 75 y.o. male with history of  DM and HTN x 5-10 yrs each, no hx MI/ CVA in the past.  Patient was leaving the dentist today around 12 pm and had an episode of weakness / "clumsiness" of the L arm which resolved by the time her got home. At home this afternoon a little later he had several episodes of "dysequilibrium" and fell to the left 2 or 3 times in the home.  Fell loss of balance.  Then he felt better again.  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  Family was there and says that his face was "twisted" and he couldn't move his left arm and was dragging the left leg.  EMS was called.  On arrival here he had no neuro deficits. Head CT was negative for anything acute. EKG showed NSR, no ST depression/ elevation, diffuse TWI.    Labs ok except for creat 1.09. Asked to see for possible stuttering CVA.    Patient takes oral meds for DM 5-10 yrs and for HTN.  Denies hx of CVA, MI/angina/ stents, no hx vasc disease, CHF/ afib.  No recent abd pain, N/v/d, no voiding issues, no hx kidney problems.    Patient is a [employer]or[employer], still working.  No tob, occ social etoh.       ROS  denies CP  no joint pain   no HA  no blurry vision  no rash  no diarrhea  no nausea/ vomiting  no dysuria  no difficulty voiding  no change in urine color    Past Medical History  Past Medical History  Diagnosis Date  . Hypertension   . Diabetes mellitus without complication ([place])   . Enlarged prostate    Past Surgical History History reviewed. No pertinent past surgical history. Family History No family history on file. Social History   reports that he has never smoked. He does not have any smokeless tobacco history on file. He reports that he does not drink alcohol or use illicit drugs. Allergies  Allergies  Allergen Reactions  . Shellfish Allergy Nausea And Vomiting  . Neosporin [Neomycin-Bacitracin Zn-Polymyx] Rash  . Zinc Rash   Home medications Prior to Admission medications   Medication Sig Start Date End Date Taking? Authorizing Provider  glipiZIDE (GLUCOTROL) 5 MG tablet Take 5 mg by mouth 2 (two) times daily before a meal.    Historical Provider, MD  ibuprofen (ADVIL,MOTRIN) 600 MG tablet Take 1 tablet (600 mg total) by mouth every 6 (six) hours as needed for pain. 11/05/12   Amber Fidel Levy, MD  losartan (COZAAR) 50 MG tablet Take 50 mg by mouth daily.    Historical Provider, MD  metFORMIN (GLUMETZA) 1000 MG (MOD) 24 hr tablet Take 1,000 mg by mouth daily with breakfast.    Historical Provider, MD  metoprolol succinate (TOPROL-XL) 100 MG 24 hr tablet Take 100 mg by mouth daily. Take with or immediately following a meal.    Historical Provider, MD  oxyCODONE-acetaminophen (PERCOCET/ROXICET) 5-325 MG per tablet Take 1 tablet by mouth every 4 (four) hours as needed for pain. 11/05/12   [place],  MD   Liver Function Tests No results for input(s): AST, ALT, ALKPHOS, BILITOT, PROT, ALBUMIN in the last 168 hours. No results for input(s): LIPASE, AMYLASE in the last 168 hours. CBC  Recent Labs Lab 03/24/16 2046 03/24/16 2057  WBC 6.8  --   NEUTROABS 3.4  --   HGB 11.6* 12.2*  HCT 34.0* 36.0*  MCV 83.1  --   PLT 236  --    Basic Metabolic Panel  Recent Labs Lab 03/24/16 2057  NA 141  K 3.0*  CL 100*  GLUCOSE 107*  BUN 15  CREATININE 1.30*     Filed Vitals:   03/24/16 2029 03/24/16 2051  BP: 206/107 196/103  Pulse: 75 72  Temp: 98 F (36.7 C)   TempSrc: Oral   Resp: 20 22  SpO2: 96% 97%   Exam: Gen alert, no distress, calm No rash, cyanosis or gangrene Sclera anicteric, throat clear   No jvd or bruits Chest clear bilat RRR no MRG Abd soft ntnd no mass or ascites +bs GU normal male MS no joint effusions or deformity Ext no L edema / no wounds or ulcers Neuro is alert, Ox 3 , nf Normal recall, normal memory CN's intact 2-12 Full strength in UE's and LE's bilat, symmetric No drift on testing of UE/ LE's Good sensation in UE's/ LE's No facial droop.     Assessment: 1  Acute L sided stroke-like symptoms , recurrent - neg head CT.  Plan is for admission to [facility], get neuro consult, further diagnostic testing to be done at [facility].  ED MD here has spoken w neurology on call, see their notes.  2  DM on oral agents, cont and use SSI q 4 for now 3  HTN split dose BP meds and hold for SBP < 160  Plan - as above     Idabel D [facility] Pager [phone]  Cell [phone]  If 7PM-7AM, please contact night-coverage [email or web address] Password [email or web address] 03/24/2016, 9:15 PM

## 2016-03-25 ENCOUNTER — Inpatient Hospital Stay (HOSPITAL_COMMUNITY): Payer: Medicare Other

## 2016-03-25 ENCOUNTER — Inpatient Hospital Stay (HOSPITAL_BASED_OUTPATIENT_CLINIC_OR_DEPARTMENT_OTHER): Payer: Medicare Other

## 2016-03-25 DIAGNOSIS — R9431 Abnormal electrocardiogram [ECG] [EKG]: Secondary | ICD-10-CM

## 2016-03-25 DIAGNOSIS — G451 Carotid artery syndrome (hemispheric): Secondary | ICD-10-CM | POA: Diagnosis not present

## 2016-03-25 DIAGNOSIS — E876 Hypokalemia: Secondary | ICD-10-CM

## 2016-03-25 DIAGNOSIS — I161 Hypertensive emergency: Secondary | ICD-10-CM | POA: Diagnosis not present

## 2016-03-25 DIAGNOSIS — G459 Transient cerebral ischemic attack, unspecified: Secondary | ICD-10-CM

## 2016-03-25 DIAGNOSIS — E119 Type 2 diabetes mellitus without complications: Secondary | ICD-10-CM | POA: Insufficient documentation

## 2016-03-25 DIAGNOSIS — I10 Essential (primary) hypertension: Secondary | ICD-10-CM | POA: Diagnosis not present

## 2016-03-25 HISTORY — DX: Hypokalemia: E87.6

## 2016-03-25 HISTORY — DX: Abnormal electrocardiogram (ECG) (EKG): R94.31

## 2016-03-25 LAB — LIPID PANEL
Cholesterol: 192 mg/dL (ref 0–200)
HDL: 32 mg/dL — AB (ref >40)
LDL CALC: 123 mg/dL — AB (ref 0–99)
Total CHOL/HDL Ratio: 6 RATIO
Triglycerides: 186 mg/dL — ABNORMAL HIGH (ref <150)
VLDL: 37 mg/dL (ref 0–40)

## 2016-03-25 LAB — GLUCOSE, CAPILLARY
GLUCOSE-CAPILLARY: 240 mg/dL — AB (ref 65–99)
GLUCOSE-CAPILLARY: 119 mg/dL — AB (ref 65–99)
GLUCOSE-CAPILLARY: 145 mg/dL — AB (ref 65–99)
Glucose-Capillary: 154 mg/dL — ABNORMAL HIGH (ref 65–99)

## 2016-03-25 LAB — VAS US CAROTID
LCCADDIAS: -16 cm/s
LCCAPSYS: 84 cm/s
LEFT ECA DIAS: -12 cm/s
LEFT VERTEBRAL DIAS: -11 cm/s
LICADDIAS: -14 cm/s
LICADSYS: -37 cm/s
LICAPDIAS: -18 cm/s
LICAPSYS: -61 cm/s
Left CCA dist sys: -64 cm/s
Left CCA prox dias: 18 cm/s
RIGHT ECA DIAS: -12 cm/s
RIGHT VERTEBRAL DIAS: -13 cm/s
Right CCA prox dias: -8 cm/s
Right CCA prox sys: -88 cm/s
Right cca dist sys: -60 cm/s

## 2016-03-25 LAB — CBC
HCT: 33.6 % — ABNORMAL LOW (ref 39.0–52.0)
HEMOGLOBIN: 11.3 g/dL — AB (ref 13.0–17.0)
MCH: 27.9 pg (ref 26.0–34.0)
MCHC: 33.6 g/dL (ref 30.0–36.0)
MCV: 83 fL (ref 78.0–100.0)
PLATELETS: 218 10*3/uL (ref 150–400)
RBC: 4.05 MIL/uL — ABNORMAL LOW (ref 4.22–5.81)
RDW: 13.7 % (ref 11.5–15.5)
WBC: 5.9 10*3/uL (ref 4.0–10.5)

## 2016-03-25 LAB — BASIC METABOLIC PANEL
ANION GAP: 8 (ref 5–15)
BUN: 11 mg/dL (ref 6–20)
CALCIUM: 8.6 mg/dL — AB (ref 8.9–10.3)
CO2: 25 mmol/L (ref 22–32)
CREATININE: 1.35 mg/dL — AB (ref 0.61–1.24)
Chloride: 102 mmol/L (ref 101–111)
GFR calc non Af Amer: 50 mL/min — ABNORMAL LOW (ref >60)
GFR, EST AFRICAN AMERICAN: 58 mL/min — AB (ref >60)
GLUCOSE: 195 mg/dL — AB (ref 65–99)
Potassium: 2.9 mmol/L — ABNORMAL LOW (ref 3.5–5.1)
Sodium: 135 mmol/L (ref 135–145)

## 2016-03-25 LAB — TROPONIN I
TROPONIN I: 0.03 ng/mL — AB (ref <0.03)
Troponin I: <0.03 ng/mL (ref <0.03)

## 2016-03-25 LAB — MRSA PCR SCREENING: MRSA BY PCR: NEGATIVE

## 2016-03-25 LAB — MAGNESIUM: MAGNESIUM: 1.6 mg/dL — AB (ref 1.7–2.4)

## 2016-03-25 MED ORDER — LOSARTAN POTASSIUM 50 MG PO TABS
50.0000 mg | ORAL_TABLET | Freq: Two times a day (BID) | ORAL | Status: DC
  Administered 2016-03-25: 50 mg via ORAL
  Filled 2016-03-25: qty 1

## 2016-03-25 MED ORDER — POTASSIUM CHLORIDE CRYS ER 20 MEQ PO TBCR
40.0000 meq | EXTENDED_RELEASE_TABLET | ORAL | Status: AC
  Administered 2016-03-25 (×3): 40 meq via ORAL
  Filled 2016-03-25 (×3): qty 2

## 2016-03-25 MED ORDER — HYDRALAZINE HCL 20 MG/ML IJ SOLN: 5.0000 mg | Freq: Four times a day (QID) | INTRAMUSCULAR | Status: DC | PRN

## 2016-03-25 MED ORDER — INSULIN ASPART 100 UNIT/ML ~~LOC~~ SOLN
1.0000 [IU] | Freq: Three times a day (TID) | SUBCUTANEOUS | Status: DC
  Administered 2016-03-26: 1 [IU] via SUBCUTANEOUS

## 2016-03-25 MED ORDER — ATORVASTATIN CALCIUM 20 MG PO TABS
20.0000 mg | ORAL_TABLET | Freq: Every day | ORAL | Status: DC
Start: 2016-03-25 — End: 2016-03-26
  Administered 2016-03-25: 20 mg via ORAL
  Filled 2016-03-25: qty 1

## 2016-03-25 MED ORDER — MAGNESIUM SULFATE 4 GM/100ML IV SOLN
4.0000 g | Freq: Once | INTRAVENOUS | Status: AC
  Administered 2016-03-25: 4 g via INTRAVENOUS
  Filled 2016-03-25: qty 100

## 2016-03-25 MED ORDER — METOPROLOL SUCCINATE ER 50 MG PO TB24
50.0000 mg | ORAL_TABLET | Freq: Two times a day (BID) | ORAL | Status: DC
  Administered 2016-03-25: 50 mg via ORAL
  Filled 2016-03-25: qty 1

## 2016-03-25 MED ORDER — ASPIRIN 325 MG PO TABS
325.0000 mg | ORAL_TABLET | Freq: Every day | ORAL | Status: DC
  Administered 2016-03-25 – 2016-03-26 (×2): 325 mg via ORAL
  Filled 2016-03-25 (×2): qty 1

## 2016-03-25 NOTE — Care Management Important Message (Signed)
Important Message  Patient Details  Name: Michael Hudson MRN: [id number] Date of Birth: 22-Nov-1940   Medicare Important Message Given:  Yes    Loann Quill 03/25/2016, 10:06 AM

## 2016-03-25 NOTE — Progress Notes (Signed)
VASCULAR LAB PRELIMINARY  PRELIMINARY  PRELIMINARY  PRELIMINARY  Carotid duplex has been completed.    Bilateral:  1-39% ICA stenosis.  Vertebral artery flow is antegrade.     Loucinda Croy, RVT, RDMS 03/25/2016, 1:28 PM

## 2016-03-25 NOTE — Care Management Obs Status (Signed)
[place] NOTIFICATION   Patient Details  Name: VEDANTH BUCKALEW MRN: [id number] Date of Birth: Apr 30, 1941   Medicare Observation Status Notification Given:  Yes, code 53 letter given, wife and pt would not sign.    Lacretia Leigh, RN 03/25/2016, 2:45 PM

## 2016-03-25 NOTE — Progress Notes (Signed)
STROKE TEAM PROGRESS NOTE   HISTORY OF PRESENT ILLNESS (per record) Michael Hudson is an 75 y.o. male with a history of hypertension and diabetes mellitus brought to the emergency room for evaluation of recurrent side weakness. Initial episode occurred around noon today weakness and clumsiness of his left arm which subsequently resolved. He later had several episodes of disequilibrium with falling towards the left side. Around 6:30 PM he had an episode of left arm and leg weakness as well as left facial droop. EMS was called and he was brought to the emergency room at [facility]. Deficits had resolved by the time he arrived and have not recurred. He was given aspirin in the ED. CT scan of his head showed no acute intracranial abnormality. He has no history of stroke nor TIA. NIH stroke score at the time of this evaluation was 0. He was LKW at 6:30 PM on 03/24/2016. Patient was not administered IV t-PA secondary to deficits resolved. He was admitted to stepdown for further evaluation and treatment.   SUBJECTIVE (INTERVAL HISTORY) His wife is at the bedside.  He and wife recounted HPI with DR. Alazae Crymes. Dr. Leonie Man discussed case with Dr. Grandville Silos at bedside.  Overall he feels his condition is stable.    OBJECTIVE Temp:  [97.7 F (36.5 C)-98.6 F (37 C)] 98.6 F (37 C) (06/28 0756) Pulse Rate:  [67-77] 75 (06/28 0819) Cardiac Rhythm:  [-] Normal sinus rhythm (06/28 0700) Resp:  [16-24] 16 (06/28 0756) BP: (173-206)/(90-107) 177/91 mmHg (06/28 0756) SpO2:  [96 %-100 %] 98 % (06/28 0756) Weight:  [90.719 kg (200 lb)] 90.719 kg (200 lb) (06/27 2245)  CBC:   Recent Labs Lab 03/24/16 2046 03/24/16 2057  WBC 6.8  --   NEUTROABS 3.4  --   HGB 11.6* 12.2*  HCT 34.0* 36.0*  MCV 83.1  --   PLT 236  --     Basic Metabolic Panel:   Recent Labs Lab 03/24/16 2046 03/24/16 2057  NA 138 141  K 2.9* 3.0*  CL 103 100*  CO2 27  --   GLUCOSE 109* 107*  BUN 16 15  CREATININE 1.42* 1.30*  CALCIUM 8.6*   --     Lipid Panel: No results found for: CHOL, TRIG, HDL, CHOLHDL, VLDL, LDLCALC HgbA1c: No results found for: HGBA1C Urine Drug Screen: No results found for: LABOPIA, COCAINSCRNUR, LABBENZ, AMPHETMU, THCU, LABBARB    IMAGING  Mr Jodene Nam Head Wo Contrast  03/25/2016  CLINICAL DATA:  Brief episode of LEFT arm weakness while at dentist today at noon. Disequilibrium. LEFT leg weakness. History of hypertension, diabetes. EXAM: MRI HEAD WITHOUT CONTRAST MRA HEAD WITHOUT CONTRAST TECHNIQUE: Multiplanar, multiecho pulse sequences of the brain and surrounding structures were obtained without intravenous contrast. Angiographic images of the head were obtained using MRA technique without contrast. COMPARISON:  CT HEAD March 24, 2016 FINDINGS: MRI HEAD FINDINGS INTRACRANIAL CONTENTS: No reduced diffusion to suggest acute ischemia. RIGHT basal ganglia subcentimeter infarct with microhemorrhage. The ventricles and sulci are normal for patient's age, cavum septum pellucidum is a normal variant. No suspicious parenchymal signal, masses or mass effect. Patchy to confluent supratentorial white matter FLAIR T2 hyperintensities. No abnormal extra-axial fluid collections. No extra-axial masses though, contrast enhanced sequences would be more sensitive. Normal major intracranial vascular flow voids present at skull base. ORBITS: The included ocular globes and orbital contents are non-suspicious. SINUSES: Small RIGHT maxillary mucosal retention cyst. Trace fronto ethmoid mucosal thickening. Mastoid air cells are well aerated. SKULL/SOFT TISSUES: No  abnormal sellar expansion. No suspicious calvarial bone marrow signal. Craniocervical junction maintained. MRA HEAD FINDINGS ANTERIOR CIRCULATION: Normal flow related enhancement of the included cervical, petrous, cavernous and supraclinoid internal carotid arteries. Patent anterior communicating artery. Normal flow related enhancement of the anterior and middle cerebral arteries,  including distal segments. No large vessel occlusion, high-grade stenosis, abnormal luminal irregularity, aneurysm. POSTERIOR CIRCULATION: vertebral artery is dominant. Basilar artery is patent, with normal flow related enhancement of the main branch vessels. Robust RIGHT posterior communicating artery. Normal flow related enhancement of the posterior cerebral arteries. No large vessel occlusion, high-grade stenosis, abnormal luminal irregularity, aneurysm. IMPRESSION: MRI HEAD: No acute intracranial process, specifically no acute ischemia. 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 MRA HEAD: Negative. Electronically Signed   By: Michael Hudson M.D.   On: 03/25/2016 05:35   Mr Brain Wo Contrast  03/25/2016  CLINICAL DATA:  Brief episode of LEFT arm weakness while at dentist today at noon. Disequilibrium. LEFT leg weakness. History of hypertension, diabetes. EXAM: MRI HEAD WITHOUT CONTRAST MRA HEAD WITHOUT CONTRAST TECHNIQUE: Multiplanar, multiecho pulse sequences of the brain and surrounding structures were obtained without intravenous contrast. Angiographic images of the head were obtained using MRA technique without contrast. COMPARISON:  CT HEAD March 24, 2016 FINDINGS: MRI HEAD FINDINGS INTRACRANIAL CONTENTS: No reduced diffusion to suggest acute ischemia. RIGHT basal ganglia subcentimeter infarct with microhemorrhage. The ventricles and sulci are normal for patient's age, cavum septum pellucidum is a normal variant. No suspicious parenchymal signal, masses or mass effect. Patchy to confluent supratentorial white matter FLAIR T2 hyperintensities. No abnormal extra-axial fluid collections. No extra-axial masses though, contrast enhanced sequences would be more sensitive. Normal major intracranial vascular flow voids present at skull base. ORBITS: The included ocular globes and orbital contents are non-suspicious. SINUSES: Small RIGHT maxillary mucosal  retention cyst. Trace fronto ethmoid mucosal thickening. Mastoid air cells are well aerated. SKULL/SOFT TISSUES: No abnormal sellar expansion. No suspicious calvarial bone marrow signal. Craniocervical junction maintained. MRA HEAD FINDINGS ANTERIOR CIRCULATION: Normal flow related enhancement of the included cervical, petrous, cavernous and supraclinoid internal carotid arteries. Patent anterior communicating artery. Normal flow related enhancement of the anterior and middle cerebral arteries, including distal segments. No large vessel occlusion, high-grade stenosis, abnormal luminal irregularity, aneurysm. POSTERIOR CIRCULATION: vertebral artery is dominant. Basilar artery is patent, with normal flow related enhancement of the main branch vessels. Robust RIGHT posterior communicating artery. Normal flow related enhancement of the posterior cerebral arteries. No large vessel occlusion, high-grade stenosis, abnormal luminal irregularity, aneurysm. IMPRESSION: MRI HEAD: No acute intracranial process, specifically no acute ischemia. 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 MRA HEAD: Negative. Electronically Signed   By: Michael Hudson M.D.   On: 03/25/2016 05:35   Ct Head Code Stroke W/o Cm  03/24/2016  CLINICAL DATA:  Stroke like symptoms EXAM: CT HEAD WITHOUT CONTRAST TECHNIQUE: Contiguous axial images were obtained from the base of the skull through the vertex without intravenous contrast. COMPARISON:  None. FINDINGS: Generalized atrophy. Hypodensity in the periventricular white matter bilaterally consistent with chronic microvascular ischemia. Small chronic lacunar infarction right putamen. No acute infarct. Negative for hemorrhage or mass. Negative calvarium Mucosal cyst in the right maxillary sinus. IMPRESSION: Atrophy and chronic microvascular ischemia. No acute infarct identified. These results were called by telephone at the time of interpretation on  03/24/2016 at 8:46 pm to Dr. Charlesetta Hudson , who verbally acknowledged these results. Electronically Signed  By: Franchot Gallo M.D.   On: 03/24/2016 20:49    PHYSICAL EXAM Pleasant elderly elderly african [place] male not in distress.  . Afebrile. Head is nontraumatic. Neck is supple without bruit.    Cardiac exam no murmur or gallop. Lungs are clear to auscultation. Distal pulses are well felt. Neurological Exam :  Awake  Alert oriented x 3. Normal speech and language.eye movements full without nystagmus.fundi were not visualized. Vision acuity and fields appear normal. Hearing is normal. Palatal movements are normal. Face symmetric. Tongue midline. Normal strength, tone, reflexes and coordination. Normal sensation. Gait deferred. ASSESSMENT/PLAN Mr. TATEUM ELVIDGE is a 75 y.o. male with history of HTN and DB presenting with recurrent R sided weakness. He did not receive IV t-PA due to deficits resolved.   Recurrent R Brain TIAs  MRI  No acute stroke  MRA  negative  Carotid Doppler  pending   2D Echo  pending   LDL pending   HgbA1c pending  SCDs ordered for VTE prophylaxis Diet heart healthy/carb modified Room service appropriate?: Yes; Fluid consistency:: Thin  No antithrombotic prior to admission, now on aspirin 325 mg daily  Patient counseled to be compliant with his antithrombotic medications  Ongoing aggressive stroke risk factor management  Therapy recommendations:  pending   Disposition:  pending   Hypertensive Emergency  BP 206/107 in setting of neurologic symptoms  Improving today, 177/91 this am  Permissive hypertension (OK if < 220/120) but gradually normalize in 5-7 days  Long-term BP goal normotensive  Diabetes  HgbA1c pending, goal < 7.0  Other Stroke Risk Factors  Advanced age  Overweight, Body mass index is 29.12 kg/(m^2)., recommend weight loss, diet and exercise as appropriate   Other Active Problems  Enlarged prostate  EKG changes.  Troponin and repeat EKG ordered  Hospital day # Tama [place] for Pager information 03/25/2016 9:36 AM  I have personally examined this patient, reviewed notes, independently viewed imaging studies, participated in medical decision making and plan of care. I have made any additions or clarifications directly to the above note. Agree with note above. He presented with transient right-sided weakness due to a left brain subcortical infarct etiology likely small vessel disease. He remains at risk for neurological worsening, recurrent stroke, TIA needs ongoing stroke evaluation and aggressive risk factor modification. Start aspirin for stroke prevention. Discussed with patient and Dr. Grandville Silos and answered questions . Greater than 50% of time during this 35 minute visit was spent on counseling and coordination of care about stroke risk, risk factors, treatment and prevention Antony Contras, MD Medical Director [facility] Pager: [phone] 03/25/2016 1:45 PM    To contact Stroke Continuity provider, please refer to [email or web address] After hours, contact General Neurology

## 2016-03-25 NOTE — Progress Notes (Addendum)
PROGRESS NOTE    Michael Hudson  [id number] DOB: Feb 20, 1941 DOA: 03/24/2016 PCP: No primary care provider on file.   Brief Narrative:  Patient is a pleasant 75 year old gentleman history of hypertension, diabetes who was brought to the ED secondary to recurrent left-sided weakness with an episode of left facial droop. EMS was called and by the time patient had presented to the [facility] ADD patient was noted to have resolved deficits. Aspirin given in the ED. CT head with no acute abnormality. TPA was not administered secondary to resolved deficits. Patient transferred to [facility] for further stroke workup.   Assessment & Plan:   Principal Problem:   TIA (transient ischemic attack) Active Problems:   Hypertensive emergency   Left-sided weakness   DM type 2 (diabetes mellitus, type 2) (HCC)   Hypertension, essential   CVA (cerebral infarction)   Type 2 diabetes mellitus without complication, without long-term current use of insulin (HCC)   Abnormal EKG   Hypokalemia   #1 recurrent right brain TIAs/left sided weakness Patient had presented with recurrent left-sided weakness with episode of left facial droop. Deficits resolved. Patient did not receive IV TPA secondary to deficits resolved. MRI MRA brain negative for any acute strokes. Carotid Dopplers pending. 2-D echo pending. LDL pending. Hemoglobin A1c pending. PT/OT pending. Continue aspirin for secondary stroke prevention. Ongoing risk factor management. Neurology following and appreciate input and recommendations.  #2 hypertensive emergency On admission patient was noted to have a blood pressure of 206/107 in the setting of neurological symptoms. Blood pressure improved. Permissive hypertension 0K if systolic blood pressure less than [id number] and diastolic less than [id number] but gradually normalize over the next 5-7 days. Continue current regimen of Cozaar and metoprolol. Follow.  #3 diabetes mellitus type 2 Check a hemoglobin  A1c. CBGs have ranged from 91-240. Hold oral hypoglycemic agents. Sliding scale insulin.  #4 abnormal EKG Questionable etiology. Patient denies any chest pain or shortness of breath. Admission EKG with T-wave inversions in leads 2,3 aVF, V4 through V6. Repeat EKG this morning with T-wave inversions in V4 through V6 and leads 2 and aVF. Cycle cardiac enzymes every 6 hours 3. 2-D echo pending. Continue aspirin, beta blocker, ARB. If 2-D echo is abnormal consulted cardiology. Follow.  #5 hypokalemia Check a magnesium level. BMET pending. Replete.     DVT prophylaxis: SCDs Code Status: Full Family Communication: Updated patient and wife at bedside. Disposition Plan: Hopefully home pending stroke workup pending PT evaluation.   Consultants:   Neurology: Dr. Nicole Kindred 03/24/2016  Procedures:  MRI MRA head 03/25/2016  CT head 03/24/2016  Antimicrobials:   None   Subjective: Patient states left-sided weakness improved significantly. Patient denies any chest pain. No shortness of breath.  Objective: Filed Vitals:   03/25/16 0345 03/25/16 0510 03/25/16 0756 03/25/16 0819  BP: 182/96 173/93 177/91   Pulse: 74 68 77 75  Temp: 98 F (36.7 C)  98.6 F (37 C)   TempSrc: Oral  Oral   Resp: 18 17 16    Height:      Weight:      SpO2: 99% 98% 98%     Intake/Output Summary (Last 24 hours) at 03/25/16 1010 Last data filed at 03/25/16 0806  Gross per 24 hour  Intake 678.33 ml  Output   2050 ml  Net -1371.67 ml   Filed Weights   03/24/16 2245  Weight: 90.719 kg (200 lb)    Examination:  General exam: Appears calm and comfortable  Respiratory system:  Clear to auscultation. Respiratory effort normal. Cardiovascular system: S1 & S2 heard, RRR. No JVD, murmurs, rubs, gallops or clicks. No pedal edema. Gastrointestinal system: Abdomen is nondistended, soft and nontender. No organomegaly or masses felt. Normal bowel sounds heard. Central nervous system: Alert and oriented. No  focal neurological deficits. Extremities: Symmetric 5 x 5 power. Skin: No rashes, lesions or ulcers Psychiatry: Judgement and insight appear normal. Mood & affect appropriate.     Data Reviewed: I have personally reviewed following labs and imaging studies  CBC:  Recent Labs Lab 03/24/16 2046 03/24/16 2057  WBC 6.8  --   NEUTROABS 3.4  --   HGB 11.6* 12.2*  HCT 34.0* 36.0*  MCV 83.1  --   PLT 236  --    Basic Metabolic Panel:  Recent Labs Lab 03/24/16 2046 03/24/16 2057  NA 138 141  K 2.9* 3.0*  CL 103 100*  CO2 27  --   GLUCOSE 109* 107*  BUN 16 15  CREATININE 1.42* 1.30*  CALCIUM 8.6*  --    GFR: Estimated Creatinine Clearance: 56 mL/min (by C-G formula based on Cr of 1.3). Liver Function Tests:  Recent Labs Lab 03/24/16 2046  AST 21  ALT 16*  ALKPHOS 57  BILITOT 0.4  PROT 8.0  ALBUMIN 4.3   No results for input(s): LIPASE, AMYLASE in the last 168 hours. No results for input(s): AMMONIA in the last 168 hours. Coagulation Profile:  Recent Labs Lab 03/24/16 2046  INR 1.06   Cardiac Enzymes: No results for input(s): CKTOTAL, CKMB, CKMBINDEX, TROPONINI in the last 168 hours. BNP (last 3 results) No results for input(s): PROBNP in the last 8760 hours. HbA1C: No results for input(s): HGBA1C in the last 72 hours. CBG:  Recent Labs Lab 03/24/16 2049 03/24/16 2305 03/25/16 0801  GLUCAP 110* 91 240*   Lipid Profile: No results for input(s): CHOL, HDL, LDLCALC, TRIG, CHOLHDL, LDLDIRECT in the last 72 hours. Thyroid Function Tests: No results for input(s): TSH, T4TOTAL, FREET4, T3FREE, THYROIDAB in the last 72 hours. Anemia Panel: No results for input(s): VITAMINB12, FOLATE, FERRITIN, TIBC, IRON, RETICCTPCT in the last 72 hours. Sepsis Labs: No results for input(s): PROCALCITON, LATICACIDVEN in the last 168 hours.  Recent Results (from the past 240 hour(s))  MRSA PCR Screening     Status: None   Collection Time: 03/24/16 11:00 PM  Result  Value Ref Range Status   MRSA by PCR NEGATIVE NEGATIVE Final    Comment:        The GeneXpert MRSA Assay (FDA approved for NASAL specimens only), is one component of a comprehensive MRSA colonization surveillance program. It is not intended to diagnose MRSA infection nor to guide or monitor treatment for MRSA infections.          Radiology Studies: Mr Virgel Paling [id number] Contrast  03/25/2016  CLINICAL DATA:  Brief episode of LEFT arm weakness while at dentist today at noon. Disequilibrium. LEFT leg weakness. History of hypertension, diabetes. EXAM: MRI HEAD WITHOUT CONTRAST MRA HEAD WITHOUT CONTRAST TECHNIQUE: Multiplanar, multiecho pulse sequences of the brain and surrounding structures were obtained without intravenous contrast. Angiographic images of the head were obtained using MRA technique without contrast. COMPARISON:  CT HEAD March 24, 2016 FINDINGS: MRI HEAD FINDINGS INTRACRANIAL CONTENTS: No reduced diffusion to suggest acute ischemia. RIGHT basal ganglia subcentimeter infarct with microhemorrhage. The ventricles and sulci are normal for patient's age, cavum septum pellucidum is a normal variant. No suspicious parenchymal signal, masses or mass effect. Patchy to  confluent supratentorial white matter FLAIR T2 hyperintensities. No abnormal extra-axial fluid collections. No extra-axial masses though, contrast enhanced sequences would be more sensitive. Normal major intracranial vascular flow voids present at skull base. ORBITS: The included ocular globes and orbital contents are non-suspicious. SINUSES: Small RIGHT maxillary mucosal retention cyst. Trace fronto ethmoid mucosal thickening. Mastoid air cells are well aerated. SKULL/SOFT TISSUES: No abnormal sellar expansion. No suspicious calvarial bone marrow signal. Craniocervical junction maintained. MRA HEAD FINDINGS ANTERIOR CIRCULATION: Normal flow related enhancement of the included cervical, petrous, cavernous and supraclinoid internal  carotid arteries. Patent anterior communicating artery. Normal flow related enhancement of the anterior and middle cerebral arteries, including distal segments. No large vessel occlusion, high-grade stenosis, abnormal luminal irregularity, aneurysm. POSTERIOR CIRCULATION: vertebral artery is dominant. Basilar artery is patent, with normal flow related enhancement of the main branch vessels. Robust RIGHT posterior communicating artery. Normal flow related enhancement of the posterior cerebral arteries. No large vessel occlusion, high-grade stenosis, abnormal luminal irregularity, aneurysm. IMPRESSION: MRI HEAD: No acute intracranial process, specifically no acute ischemia. 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 MRA HEAD: Negative. Electronically Signed   By: Elon Alas M.D.   On: 03/25/2016 05:35   Mr Brain Wo Contrast  03/25/2016  CLINICAL DATA:  Brief episode of LEFT arm weakness while at dentist today at noon. Disequilibrium. LEFT leg weakness. History of hypertension, diabetes. EXAM: MRI HEAD WITHOUT CONTRAST MRA HEAD WITHOUT CONTRAST TECHNIQUE: Multiplanar, multiecho pulse sequences of the brain and surrounding structures were obtained without intravenous contrast. Angiographic images of the head were obtained using MRA technique without contrast. COMPARISON:  CT HEAD March 24, 2016 FINDINGS: MRI HEAD FINDINGS INTRACRANIAL CONTENTS: No reduced diffusion to suggest acute ischemia. RIGHT basal ganglia subcentimeter infarct with microhemorrhage. The ventricles and sulci are normal for patient's age, cavum septum pellucidum is a normal variant. No suspicious parenchymal signal, masses or mass effect. Patchy to confluent supratentorial white matter FLAIR T2 hyperintensities. No abnormal extra-axial fluid collections. No extra-axial masses though, contrast enhanced sequences would be more sensitive. Normal major intracranial vascular flow voids present at  skull base. ORBITS: The included ocular globes and orbital contents are non-suspicious. SINUSES: Small RIGHT maxillary mucosal retention cyst. Trace fronto ethmoid mucosal thickening. Mastoid air cells are well aerated. SKULL/SOFT TISSUES: No abnormal sellar expansion. No suspicious calvarial bone marrow signal. Craniocervical junction maintained. MRA HEAD FINDINGS ANTERIOR CIRCULATION: Normal flow related enhancement of the included cervical, petrous, cavernous and supraclinoid internal carotid arteries. Patent anterior communicating artery. Normal flow related enhancement of the anterior and middle cerebral arteries, including distal segments. No large vessel occlusion, high-grade stenosis, abnormal luminal irregularity, aneurysm. POSTERIOR CIRCULATION: vertebral artery is dominant. Basilar artery is patent, with normal flow related enhancement of the main branch vessels. Robust RIGHT posterior communicating artery. Normal flow related enhancement of the posterior cerebral arteries. No large vessel occlusion, high-grade stenosis, abnormal luminal irregularity, aneurysm. IMPRESSION: MRI HEAD: No acute intracranial process, specifically no acute ischemia. 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 MRA HEAD: Negative. Electronically Signed   By: Elon Alas M.D.   On: 03/25/2016 05:35   Ct Head Code Stroke W/o Cm  03/24/2016  CLINICAL DATA:  Stroke like symptoms EXAM: CT HEAD WITHOUT CONTRAST TECHNIQUE: Contiguous axial images were obtained from the base of the skull through the vertex without intravenous contrast. COMPARISON:  None. FINDINGS: Generalized atrophy. Hypodensity in the periventricular white matter bilaterally consistent with chronic  microvascular ischemia. Small chronic lacunar infarction right putamen. No acute infarct. Negative for hemorrhage or mass. Negative calvarium Mucosal cyst in the right maxillary sinus. IMPRESSION: Atrophy and  chronic microvascular ischemia. No acute infarct identified. These results were called by telephone at the time of interpretation on 03/24/2016 at 8:46 pm to Dr. Charlesetta Shanks , who verbally acknowledged these results. Electronically Signed   By: Franchot Gallo M.D.   On: 03/24/2016 20:49        Scheduled Meds: . aspirin  325 mg Oral Daily  . insulin aspart  1-6 Units Subcutaneous TID WC  . losartan  25 mg Oral BID  . metoprolol succinate  25 mg Oral BID   Continuous Infusions: . sodium chloride 30 mL/hr at 03/25/16 0900     LOS: 1 day    Time spent: 40 minutes    Danean Marner, MD [facility] Pager [phone]  If 7PM-7AM, please contact night-coverage [email or web address] Password [email or web address] 03/25/2016, 10:10 AM

## 2016-03-25 NOTE — Progress Notes (Signed)
Notified MD regarding patients lab work. K=2.9, mag =1.6, and troponin =0.03. Received orders for mag and k replacement. See MAR. Will continue to monitor.

## 2016-03-26 ENCOUNTER — Observation Stay (HOSPITAL_BASED_OUTPATIENT_CLINIC_OR_DEPARTMENT_OTHER): Payer: Medicare Other

## 2016-03-26 DIAGNOSIS — I340 Nonrheumatic mitral (valve) insufficiency: Secondary | ICD-10-CM | POA: Diagnosis not present

## 2016-03-26 DIAGNOSIS — G451 Carotid artery syndrome (hemispheric): Secondary | ICD-10-CM | POA: Diagnosis not present

## 2016-03-26 DIAGNOSIS — I161 Hypertensive emergency: Secondary | ICD-10-CM | POA: Diagnosis not present

## 2016-03-26 DIAGNOSIS — R9431 Abnormal electrocardiogram [ECG] [EKG]: Secondary | ICD-10-CM | POA: Diagnosis not present

## 2016-03-26 DIAGNOSIS — I10 Essential (primary) hypertension: Secondary | ICD-10-CM | POA: Diagnosis not present

## 2016-03-26 DIAGNOSIS — E119 Type 2 diabetes mellitus without complications: Secondary | ICD-10-CM | POA: Diagnosis not present

## 2016-03-26 LAB — ECHOCARDIOGRAM COMPLETE
AOASC: 38 cm
CHL CUP DOP CALC LVOT VTI: 19.5 cm
CHL CUP MV DEC (S): 180
CHL CUP TV REG PEAK VELOCITY: 231 cm/s
E decel time: 180 msec
EERAT: 13.26
FS: 34 % (ref 28–44)
Height: 69.5 in
IV/PV OW: 1.06
LA vol A4C: 80.9 ml
LA vol: 88.7 mL
LADIAMINDEX: 1.88 cm/m2
LASIZE: 39 mm
LAVOLIN: 42.6 mL/m2
LDCA: 3.8 cm2
LEFT ATRIUM END SYS DIAM: 39 mm
LV PW d: 15.1 mm — AB (ref 0.6–1.1)
LV SIMPSON'S DISK: 58
LV TDI E'MEDIAL: 4.57
LV dias vol: 74 mL (ref 62–150)
LV e' LATERAL: 4.79 cm/s
LV sys vol index: 15 mL/m2
LV sys vol: 31 mL (ref 21–61)
LVDIAVOLIN: 35 mL/m2
LVEEAVG: 13.26
LVEEMED: 13.26
LVOT SV: 74 mL
LVOT diameter: 22 mm
LVOTPV: 95.3 cm/s
MV pk A vel: 65 m/s
MVPKEVEL: 63.5 m/s
P 1/2 time: 705 ms
Stroke v: 43 ml
TAPSE: 23 mm
TDI e' lateral: 4.79
TRMAXVEL: 231 cm/s
Weight: 3200 oz

## 2016-03-26 LAB — BASIC METABOLIC PANEL
ANION GAP: 5 (ref 5–15)
BUN: 10 mg/dL (ref 6–20)
CALCIUM: 8.5 mg/dL — AB (ref 8.9–10.3)
CO2: 26 mmol/L (ref 22–32)
CREATININE: 1.32 mg/dL — AB (ref 0.61–1.24)
Chloride: 107 mmol/L (ref 101–111)
GFR calc Af Amer: 60 mL/min — ABNORMAL LOW (ref >60)
GFR, EST NON AFRICAN AMERICAN: 51 mL/min — AB (ref >60)
Glucose, Bld: 173 mg/dL — ABNORMAL HIGH (ref 65–99)
Potassium: 3.5 mmol/L (ref 3.5–5.1)
SODIUM: 138 mmol/L (ref 135–145)

## 2016-03-26 LAB — CBC
HCT: 35.7 % — ABNORMAL LOW (ref 39.0–52.0)
Hemoglobin: 11.9 g/dL — ABNORMAL LOW (ref 13.0–17.0)
MCH: 28 pg (ref 26.0–34.0)
MCHC: 33.3 g/dL (ref 30.0–36.0)
MCV: 84 fL (ref 78.0–100.0)
PLATELETS: 211 10*3/uL (ref 150–400)
RBC: 4.25 MIL/uL (ref 4.22–5.81)
RDW: 13.7 % (ref 11.5–15.5)
WBC: 5.9 10*3/uL (ref 4.0–10.5)

## 2016-03-26 LAB — HEMOGLOBIN A1C
Hgb A1c MFr Bld: 8.4 % — ABNORMAL HIGH (ref 4.8–5.6)
MEAN PLASMA GLUCOSE: 194 mg/dL

## 2016-03-26 LAB — GLUCOSE, CAPILLARY
Glucose-Capillary: 177 mg/dL — ABNORMAL HIGH (ref 65–99)
Glucose-Capillary: 199 mg/dL — ABNORMAL HIGH (ref 65–99)

## 2016-03-26 LAB — MAGNESIUM: Magnesium: 2.1 mg/dL (ref 1.7–2.4)

## 2016-03-26 MED ORDER — ATORVASTATIN CALCIUM 20 MG PO TABS: 20.0000 mg | ORAL_TABLET | Freq: Every day | ORAL | Status: DC

## 2016-03-26 MED ORDER — AMLODIPINE BESYLATE 5 MG PO TABS: 5.0000 mg | ORAL_TABLET | Freq: Every day | ORAL | Status: DC

## 2016-03-26 MED ORDER — LOSARTAN POTASSIUM 50 MG PO TABS
100.0000 mg | ORAL_TABLET | Freq: Every day | ORAL | Status: DC
  Administered 2016-03-26: 100 mg via ORAL
  Filled 2016-03-26: qty 2

## 2016-03-26 MED ORDER — METOPROLOL SUCCINATE ER 50 MG PO TB24
100.0000 mg | ORAL_TABLET | Freq: Every day | ORAL | Status: DC
  Administered 2016-03-26: 100 mg via ORAL
  Filled 2016-03-26: qty 2

## 2016-03-26 MED ORDER — ASPIRIN 325 MG PO TABS: 325.0000 mg | ORAL_TABLET | Freq: Every day | ORAL | Status: DC

## 2016-03-26 MED ORDER — POTASSIUM CHLORIDE CRYS ER 20 MEQ PO TBCR
40.0000 meq | EXTENDED_RELEASE_TABLET | Freq: Once | ORAL | Status: AC
  Administered 2016-03-26: 40 meq via ORAL
  Filled 2016-03-26: qty 2

## 2016-03-26 NOTE — Evaluation (Signed)
Physical Therapy Evaluation/Discharge Patient Details Name: Michael Hudson MRN: [id number] DOB: 1941/06/09 Today's Date: 03/26/2016   History of Present Illness  75 y.o. male admitted to [facility] on 03/24/16 for left sided weakness and imbalance.  CT and MRI negative for acute events.  Cardiac workup in progress.  Pt thought to have TIA.  Pt with significant PMHx of HTN, and DM.  Clinical Impression  Pt is independent with all mobility. Has slow, yet steady gait.  BPs are high, but per MD notes permissibly high (see vitals flow sheet for orthostatic vitals (-)).  Pt is no longer having any symptoms.  PT to sign off at this time.      Follow Up Recommendations No PT follow up    Equipment Recommendations  None recommended by PT    Recommendations for Other Services   NA    Precautions / Restrictions Precautions Precautions: Other (comment) Precaution Comments: Monitor BP <220/<110 permissive HTN      Mobility  Bed Mobility Overal bed mobility: Independent                Transfers Overall transfer level: Independent                  Ambulation/Gait Ambulation/Gait assistance: Independent Ambulation Distance (Feet): 300 Feet Assistive device: None Gait Pattern/deviations: WFL(Within Functional Limits)     General Gait Details: slow speed, but no signs of imbalance  Stairs Stairs: Yes Stairs assistance: Modified independent (Device/Increase time) Stair Management: One rail Left;Alternating pattern;Forwards Number of Stairs: 6 General stair comments: Used rail, reciprocal pattern, no difficulties      Modified Rankin (Stroke Patients Only) Modified Rankin (Stroke Patients Only) Pre-Morbid Rankin Score: No symptoms Modified Rankin: No symptoms     Balance Overall balance assessment: Modified Independent                               Standardized Balance Assessment Standardized Balance Assessment : Dynamic Gait Index   Dynamic Gait  Index Level Surface: Normal Change in Gait Speed: Mild Impairment Gait with Horizontal Head Turns: Normal Gait with Vertical Head Turns:  (forgot to test) Gait and Pivot Turn: Normal Step Over Obstacle: Normal Step Around Obstacles: Normal Steps: Mild Impairment       Pertinent Vitals/Pain Pain Assessment: No/denies pain    Home Living Family/patient expects to be discharged to:: Private residence     Type of Home: House         Home Equipment: None      Prior Function Level of Independence: Independent         Comments: works as a Neurosurgeon, Training and development officer   Dominant Hand: Right    Extremity/Trunk Assessment   Upper Extremity Assessment: Overall WFL for tasks assessed           Lower Extremity Assessment: Overall WFL for tasks assessed (5/5, WNL sensation/coordination)      Cervical / Trunk Assessment: Normal  Communication   Communication: No difficulties  Cognition Arousal/Alertness: Awake/alert Behavior During Therapy: WFL for tasks assessed/performed Overall Cognitive Status: Within Functional Limits for tasks assessed                               Assessment/Plan    PT Assessment Patent does not need any further PT services  PT Diagnosis Difficulty walking;Abnormality of gait;Generalized weakness;Hemiplegia non-dominant side  PT Goals (Current goals can be found in the Care Plan section) Acute Rehab PT Goals Patient Stated Goal: to go home today PT Goal Formulation: All assessment and education complete, DC therapy     End of Session Equipment Utilized During Treatment: Gait belt Activity Tolerance: Patient tolerated treatment well Patient left: in chair;with call bell/phone within reach;with family/visitor present Nurse Communication: Mobility status    Functional Assessment Tool Used: assist level Functional Limitation: Mobility: Walking and moving around Mobility: Walking and Moving Around Current  Status [phone]): 0 percent impaired, limited or restricted Mobility: Walking and Moving Around Goal Status [phone]): 0 percent impaired, limited or restricted Mobility: Walking and Moving Around Discharge Status [phone]): 0 percent impaired, limited or restricted    Time: 1020-1103 (only charged 2 units due to MD in room for ~10 mins) PT Time Calculation (min) (ACUTE ONLY): 43 min   Charges:   PT Evaluation $PT Eval Low Complexity: 1 Procedure PT Treatments $Gait Training: 8-22 mins   PT G Codes:   PT G-Codes **NOT FOR INPATIENT CLASS** Functional Assessment Tool Used: assist level Functional Limitation: Mobility: Walking and moving around Mobility: Walking and Moving Around Current Status [id number]): 0 percent impaired, limited or restricted Mobility: Walking and Moving Around Goal Status [id number]): 0 percent impaired, limited or restricted Mobility: Walking and Moving Around Discharge Status [id number]): 0 percent impaired, limited or restricted    Antonin Meininger B. Veteran, [place]as, DPT [phone]   03/26/2016, 3:08 PM

## 2016-03-26 NOTE — Discharge Summary (Signed)
Physician Discharge Summary  Michael Hudson [id number] DOB: 1941-08-27 DOA: 03/24/2016  PCP: No primary care provider on file.  Admit date: 03/24/2016 Discharge date: 03/26/2016  Time spent: 65 minutes  Recommendations for Outpatient Follow-up:  1. Follow-up with PCP, Dr. Irven Easterly, [place] in 1 to 2 weeks. Patient will need blood pressure reassessed on follow-up. Patient will need a basic metabolic profile done to follow-up on electrolytes and renal function as well as a magnesium level. Patient will need ongoing risk factor modification including diabetes management and better blood pressure control. 2. Follow-up with Dr. Leonie Man of neurology in 2 months.   Discharge Diagnoses:  Principal Problem:   TIA (transient ischemic attack) Active Problems:   Hypertensive emergency   Left-sided weakness   DM type 2 (diabetes mellitus, type 2) (HCC)   Hypertension, essential   CVA (cerebral infarction)   Type 2 diabetes mellitus without complication, without long-term current use of insulin (HCC)   Abnormal EKG   Hypokalemia   DM type 2, goal A1c below 7   Discharge Condition: Stable and improved.  Diet recommendation: Carb modified diet.  Filed Weights   03/24/16 2245  Weight: 90.719 kg (200 lb)    History of present illness:  Per Dr. Marin Roberts is a 75 y.o. male with history of DM and HTN x 5-10 yrs each, no hx MI/ CVA in the past. Patient was leaving the dentist on the day of admission around 12 pm and had an episode of weakness / "clumsiness" of the L arm which resolved by the time her got home. At home this afternoon a little later he had several episodes of "dysequilibrium" and fell to the left 2 or 3 times in the home. Fell loss of balance. Then he felt better again. 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 Family was there and says that his face was  "twisted" and he couldn't move his left arm and was dragging the left leg. EMS was called. On arrival here he had no neuro deficits. Head CT was negative for anything acute. EKG showed NSR, no ST depression/ elevation, diffuse TWI. Labs ok except for creat 1.09. Asked to see for possible stuttering CVA.   Patient takes oral meds for DM 5-10 yrs and for HTN. Denies hx of CVA, MI/angina/ stents, no hx vasc disease, CHF/ afib. No recent abd pain, N/v/d, no voiding issues, no hx kidney problems.   Patient is a Surveyor, minerals, still working. No tob, occ social etoh.   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 Deficits resolved. Patient did not receive IV TPA secondary to deficits resolved. MRI MRA brain negative for any acute strokes. Carotid Dopplers with no significant ICA stenosis. 2-D echo had a normal ejection fraction of 60-65% with LVH and no source of cardiac emboli. Patient was assessed by physical therapy during the hospitalization. Fasting lipid panel obtained had a total cholesterol of 192 triglycerides of 186 HDL of 32 and LDL of 123. Patient was started on a statin during the hospitalization be discharged on a statin. Patient was also maintained on aspirin for secondary stroke prevention. Patient was seen by neurology and followed by neurology throughout the hospitalization. Patient will follow-up with neurology and outpatient setting.   #2 hypertensive emergency On admission patient  was noted to have a blood pressure of 206/107 in the setting of neurological symptoms. Blood pressure improved. Permissive hypertension 0K if systolic blood pressure less than [id number] and diastolic less than [id number] but gradually normalize over the next 5-7 days. Patient had been placed on half his home dose Cozaar metoprolol which was increased back to his full dose. Patient's blood pressure  improved. Norvasc 5 mg daily was added to patient's regimen. Outpatient follow-up.  #3 diabetes mellitus type 2 Hemoglobin A1c obtained came back at 8.4. Patient was maintained on sliding scale insulin throughout the hospitalization. Patient be discharged home back on this oral hypoglycemic agents with outpatient follow-up.  #4 abnormal EKG Likely secondary to LVH. Patient denied any chest pain or shortness of breath. Admission EKG with T-wave inversions in leads 2,3 aVF, V4 through V6. Repeat EKG the morning of 03/25/2016 with T-wave inversions in V4 through V6 and leads 2 and aVF. Cardiac enzymes were cycled which were negative 3. 2-D echo was obtained which showed an ejection fraction of 60-65%, normal left ventricular diastolic function, mild aortic valvular regurgitation, mild mitral valvular regurgitation, orderly dilated left atrium. No defect or PFO noted in the 8 show septum. No source of emboli identified. Patient was on aspirin, beta blocker, and ARB. Due to poorly controlled blood pressure 5 mg Norvasc was added to his regimen.   #5 hypokalemia/hypomagnesemia Repleted. Magnesium level was 2.1 by day of discharge.   Procedures:  MRI MRA head 03/25/2016  CT head 03/24/2016  2-D echo 03/26/2016  Carotid Dopplers 03/25/2016  Consultations:  Neurology: Dr. Nicole Kindred 03/24/2016  Discharge Exam: Filed Vitals:   03/26/16 1145 03/26/16 1458  BP: 157/92 173/97  Pulse: 71 69  Temp: 98 F (36.7 C) 98.6 F (37 C)  Resp: 17 14    General: NAD Cardiovascular: RRR Respiratory: CTAB  Discharge Instructions   Discharge Instructions    Diet Carb Modified    Complete by:  As directed      Discharge instructions    Complete by:  As directed   Follow up with PCP in 1-2 week. Follow up with Dr Leonie Man, neurology in 2 months.     Increase activity slowly    Complete by:  As directed           Current Discharge Medication List    START taking these medications   Details   amLODipine (NORVASC) 5 MG tablet Take 1 tablet (5 mg total) by mouth daily. Qty: 30 tablet, Refills: 3    aspirin 325 MG tablet Take 1 tablet (325 mg total) by mouth daily.    atorvastatin (LIPITOR) 20 MG tablet Take 1 tablet (20 mg total) by mouth daily at 6 PM. Qty: 30 tablet, Refills: 0      CONTINUE these medications which have NOT CHANGED   Details  glipiZIDE (GLUCOTROL XL) 10 MG 24 hr tablet Take 10 mg by mouth daily.    losartan (COZAAR) 100 MG tablet Take 100 mg by mouth daily.    metFORMIN (GLUMETZA) 1000 MG (MOD) 24 hr tablet Take 1,000 mg by mouth daily with breakfast.    metoprolol succinate (TOPROL-XL) 100 MG 24 hr tablet Take 100 mg by mouth daily. Take with or immediately following a meal.       Allergies  Allergen Reactions  . Shellfish Allergy Nausea And Vomiting  . Neosporin [Neomycin-Bacitracin Zn-Polymyx] Rash  . Zinc Rash   Follow-up Information    Follow up with SETHI,PRAMOD, MD. Schedule an appointment as soon  as possible for a visit in 2 months.   Specialties:  Neurology, Radiology   Contact information:   [address] [place] [address] [phone]       Please follow up.   Why:  f/u with PCP in 1-2 weeks       The results of significant diagnostics from this hospitalization (including imaging, microbiology, ancillary and laboratory) are listed below for reference.    Significant Diagnostic Studies: Mr Virgel Paling Wo Contrast  03/25/2016  CLINICAL DATA:  Brief episode of LEFT arm weakness while at dentist today at noon. Disequilibrium. LEFT leg weakness. History of hypertension, diabetes. EXAM: MRI HEAD WITHOUT CONTRAST MRA HEAD WITHOUT CONTRAST TECHNIQUE: Multiplanar, multiecho pulse sequences of the brain and surrounding structures were obtained without intravenous contrast. Angiographic images of the head were obtained using MRA technique without contrast. COMPARISON:  CT HEAD March 24, 2016 FINDINGS: MRI HEAD FINDINGS  INTRACRANIAL CONTENTS: No reduced diffusion to suggest acute ischemia. RIGHT basal ganglia subcentimeter infarct with microhemorrhage. The ventricles and sulci are normal for patient's age, cavum septum pellucidum is a normal variant. No suspicious parenchymal signal, masses or mass effect. Patchy to confluent supratentorial white matter FLAIR T2 hyperintensities. No abnormal extra-axial fluid collections. No extra-axial masses though, contrast enhanced sequences would be more sensitive. Normal major intracranial vascular flow voids present at skull base. ORBITS: The included ocular globes and orbital contents are non-suspicious. SINUSES: Small RIGHT maxillary mucosal retention cyst. Trace fronto ethmoid mucosal thickening. Mastoid air cells are well aerated. SKULL/SOFT TISSUES: No abnormal sellar expansion. No suspicious calvarial bone marrow signal. Craniocervical junction maintained. MRA HEAD FINDINGS ANTERIOR CIRCULATION: Normal flow related enhancement of the included cervical, petrous, cavernous and supraclinoid internal carotid arteries. Patent anterior communicating artery. Normal flow related enhancement of the anterior and middle cerebral arteries, including distal segments. No large vessel occlusion, high-grade stenosis, abnormal luminal irregularity, aneurysm. POSTERIOR CIRCULATION: vertebral artery is dominant. Basilar artery is patent, with normal flow related enhancement of the main branch vessels. Robust RIGHT posterior communicating artery. Normal flow related enhancement of the posterior cerebral arteries. No large vessel occlusion, high-grade stenosis, abnormal luminal irregularity, aneurysm. IMPRESSION: MRI HEAD: No acute intracranial process, specifically no acute ischemia. 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 MRA HEAD: Negative. Electronically Signed   By: Elon Alas M.D.   On: 03/25/2016 05:35   Mr Brain Wo Contrast  03/25/2016   CLINICAL DATA:  Brief episode of LEFT arm weakness while at dentist today at noon. Disequilibrium. LEFT leg weakness. History of hypertension, diabetes. EXAM: MRI HEAD WITHOUT CONTRAST MRA HEAD WITHOUT CONTRAST TECHNIQUE: Multiplanar, multiecho pulse sequences of the brain and surrounding structures were obtained without intravenous contrast. Angiographic images of the head were obtained using MRA technique without contrast. COMPARISON:  CT HEAD March 24, 2016 FINDINGS: MRI HEAD FINDINGS INTRACRANIAL CONTENTS: No reduced diffusion to suggest acute ischemia. RIGHT basal ganglia subcentimeter infarct with microhemorrhage. The ventricles and sulci are normal for patient's age, cavum septum pellucidum is a normal variant. No suspicious parenchymal signal, masses or mass effect. Patchy to confluent supratentorial white matter FLAIR T2 hyperintensities. No abnormal extra-axial fluid collections. No extra-axial masses though, contrast enhanced sequences would be more sensitive. Normal major intracranial vascular flow voids present at skull base. ORBITS: The included ocular globes and orbital contents are non-suspicious. SINUSES: Small RIGHT maxillary mucosal retention cyst. Trace fronto ethmoid mucosal thickening. Mastoid air cells are well aerated. SKULL/SOFT TISSUES: No abnormal sellar  expansion. No suspicious calvarial bone marrow signal. Craniocervical junction maintained. MRA HEAD FINDINGS ANTERIOR CIRCULATION: Normal flow related enhancement of the included cervical, petrous, cavernous and supraclinoid internal carotid arteries. Patent anterior communicating artery. Normal flow related enhancement of the anterior and middle cerebral arteries, including distal segments. No large vessel occlusion, high-grade stenosis, abnormal luminal irregularity, aneurysm. POSTERIOR CIRCULATION: vertebral artery is dominant. Basilar artery is patent, with normal flow related enhancement of the main branch vessels. Robust RIGHT  posterior communicating artery. Normal flow related enhancement of the posterior cerebral arteries. No large vessel occlusion, high-grade stenosis, abnormal luminal irregularity, aneurysm. IMPRESSION: MRI HEAD: No acute intracranial process, specifically no acute ischemia. 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 MRA HEAD: Negative. Electronically Signed   By: Elon Alas M.D.   On: 03/25/2016 05:35   Ct Head Code Stroke W/o Cm  03/24/2016  CLINICAL DATA:  Stroke like symptoms EXAM: CT HEAD WITHOUT CONTRAST TECHNIQUE: Contiguous axial images were obtained from the base of the skull through the vertex without intravenous contrast. COMPARISON:  None. FINDINGS: Generalized atrophy. Hypodensity in the periventricular white matter bilaterally consistent with chronic microvascular ischemia. Small chronic lacunar infarction right putamen. No acute infarct. Negative for hemorrhage or mass. Negative calvarium Mucosal cyst in the right maxillary sinus. IMPRESSION: Atrophy and chronic microvascular ischemia. No acute infarct identified. These results were called by telephone at the time of interpretation on 03/24/2016 at 8:46 pm to Dr. Charlesetta Shanks , who verbally acknowledged these results. Electronically Signed   By: Franchot Gallo M.D.   On: 03/24/2016 20:49    Microbiology: Recent Results (from the past 240 hour(s))  MRSA PCR Screening     Status: None   Collection Time: 03/24/16 11:00 PM  Result Value Ref Range Status   MRSA by PCR NEGATIVE NEGATIVE Final    Comment:        The GeneXpert MRSA Assay (FDA approved for NASAL specimens only), is one component of a comprehensive MRSA colonization surveillance program. It is not intended to diagnose MRSA infection nor to guide or monitor treatment for MRSA infections.      Labs: Basic Metabolic Panel:  Recent Labs Lab 03/24/16 2046 03/24/16 2057 03/25/16 1030 03/26/16 0507 03/26/16 1350   NA 138 141 135 138  --   K 2.9* 3.0* 2.9* 3.5  --   CL 103 100* 102 107  --   CO2 27  --  25 26  --   GLUCOSE 109* 107* 195* 173*  --   BUN 16 15 11 10   --   CREATININE 1.42* 1.30* 1.35* 1.32*  --   CALCIUM 8.6*  --  8.6* 8.5*  --   MG  --   --  1.6*  --  2.1   Liver Function Tests:  Recent Labs Lab 03/24/16 2046  AST 21  ALT 16*  ALKPHOS 57  BILITOT 0.4  PROT 8.0  ALBUMIN 4.3   No results for input(s): LIPASE, AMYLASE in the last 168 hours. No results for input(s): AMMONIA in the last 168 hours. CBC:  Recent Labs Lab 03/24/16 2046 03/24/16 2057 03/25/16 1030 03/26/16 0507  WBC 6.8  --  5.9 5.9  NEUTROABS 3.4  --   --   --   HGB 11.6* 12.2* 11.3* 11.9*  HCT 34.0* 36.0* 33.6* 35.7*  MCV 83.1  --  83.0 84.0  PLT 236  --  218 211   Cardiac Enzymes:  Recent Labs Lab  03/25/16 1030 03/25/16 2233  TROPONINI 0.03* <0.03   BNP: BNP (last 3 results) No results for input(s): BNP in the last 8760 hours.  ProBNP (last 3 results) No results for input(s): PROBNP in the last 8760 hours.  CBG:  Recent Labs Lab 03/25/16 1349 03/25/16 1720 03/25/16 2152 03/26/16 0754 03/26/16 1141  GLUCAP 119* 145* 154* 177* 199*       Signed:  THOMPSON,DANIEL MD.  [facility] 03/26/2016, 3:33 PM

## 2016-03-26 NOTE — Progress Notes (Signed)
Inpatient Diabetes Program Recommendations  AACE/ADA: New Consensus Statement on Inpatient Glycemic Control (2015)  Target Ranges:  Prepandial:   less than 140 mg/dL      Peak postprandial:   less than 180 mg/dL (1-2 hours)      Critically ill patients:  140 - 180 mg/dL  Results for BRITTON, HEYSER (MRN [id number]) as of 03/26/2016 11:43  Ref. Range 03/25/2016 13:49 03/25/2016 17:20 03/25/2016 21:52 03/26/2016 07:54  Glucose-Capillary Latest Ref Range: 65-99 mg/dL 119 (H) 145 (H) 154 (H) 177 (H)   Results for KIPTON, COTTEN (MRN [id number]) as of 03/26/2016 11:43  Ref. Range 03/25/2016 10:30  Hemoglobin A1C Latest Ref Range: 4.8-5.6 % 8.4 (H)   Review of Glycemic Control  Diabetes history: DM2 Outpatient Diabetes medications: Metformin 500 mg BID, Glipizide 10 mg daily Current orders for Inpatient glycemic control: Novolog 1-6 units TID with meals  Inpatient Diabetes Program Recommendations: HgbA1C: A1C 8.4% on 03/25/16 indicating an average glucose of 194 mg/dl over the past 2-3 months. Encouraged patient to talk with his PCP about prescribing more frequent glucose monitoring and about plan to improve diabetes control.  NOTE: Spoke with patient and his wife over the phone about diabetes and home regimen for diabetes control. Throughout the conversation, when talking with the patient he would ask his wife the answers to the questions asked and he asked that I just talk with his wife because she could tell me more about questions asked.  Patient checks his glucose 1 time per day and that it is usually ranges from 90's to mid 100's mg/dl. Patient's wife states that she would like to have patient check his glucose more often but the insurance will only pay for enough strips to check as prescribed by PCP.  Inquired about prior A1C and patient's wife reports that his last A1C value was 8.1%. Discussed A1C results (8.4% on 03/25/16) and explained that his current A1C indicates an average glucose of 194  mg/dl over the past 2-3 months. Discussed glucose and A1C goals. Discussed importance of checking CBGs and maintaining good CBG control to prevent long-term and short-term complications.  Discussed impact of nutrition, exercise, stress, sickness, and medications on diabetes control. Patient's wife reports that he husband follows a diabetic diet and eats at least 2 meals a day. Encouraged patient and his wife to talk to his PCP about prescribing more frequent CBG monitoring and to follow up with PCP about improving glycemic control.  Patient and his wife verbalized understanding of information discussed and they state that they has no further questions at this time related to diabetes.  Thanks, Barnie Alderman, RN, MSN, CDE Diabetes Coordinator Inpatient Diabetes Program [phone] (Team Pager) [phone] ([facility] office) [phone] [facility] office) [phone] [facility] office)

## 2016-03-26 NOTE — Progress Notes (Signed)
OT Cancellation Note  Patient Details Name: Michael Hudson MRN: [id number] DOB: 09-10-1941   Cancelled Treatment:    Reason Eval/Treat Not Completed: OT screened, no needs identified, will sign off  [facility], OTR/L  [id number] 03/26/2016 03/26/2016, 3:20 PM

## 2016-03-26 NOTE — Progress Notes (Signed)
Echocardiogram 2D Echocardiogram has been performed.  Michael Hudson 03/26/2016, 2:32 PM

## 2016-03-26 NOTE — Progress Notes (Signed)
STROKE TEAM PROGRESS NOTE   SUBJECTIVE (INTERVAL HISTORY) Wife at bedside. Patient upset as insurance company is calling him telling him he is observation only status and they want to know why he is still in the hospital. Dr. Leonie Man called and discussed with Dr. Grandville Silos.   OBJECTIVE Temp:  [97.5 F (36.4 C)-98.4 F (36.9 C)] 97.7 F (36.5 C) (06/29 0755) Pulse Rate:  [65-79] 74 (06/29 0755) Cardiac Rhythm:  [-] Normal sinus rhythm (06/29 0755) Resp:  [12-22] 12 (06/29 0755) BP: (155-193)/(81-106) 181/91 mmHg (06/29 0755) SpO2:  [96 %-99 %] 99 % (06/29 0755)  CBC:   Recent Labs Lab 03/24/16 2046  03/25/16 1030 03/26/16 0507  WBC 6.8  --  5.9 5.9  NEUTROABS 3.4  --   --   --   HGB 11.6*  < > 11.3* 11.9*  HCT 34.0*  < > 33.6* 35.7*  MCV 83.1  --  83.0 84.0  PLT 236  --  218 211  < > = values in this interval not displayed.  Basic Metabolic Panel:   Recent Labs Lab 03/25/16 1030 03/26/16 0507  NA 135 138  K 2.9* 3.5  CL 102 107  CO2 25 26  GLUCOSE 195* 173*  BUN 11 10  CREATININE 1.35* 1.32*  CALCIUM 8.6* 8.5*  MG 1.6*  --     Lipid Panel:     Component Value Date/Time   CHOL 192 03/25/2016 1030   TRIG 186* 03/25/2016 1030   HDL 32* 03/25/2016 1030   CHOLHDL 6.0 03/25/2016 1030   VLDL 37 03/25/2016 1030   LDLCALC 123* 03/25/2016 1030   HgbA1c:  Lab Results  Component Value Date   HGBA1C 8.4* 03/25/2016   Urine Drug Screen: No results found for: LABOPIA, COCAINSCRNUR, LABBENZ, AMPHETMU, THCU, LABBARB    IMAGING  Mr Jodene Nam Head Wo Contrast  03/25/2016  CLINICAL DATA:  Brief episode of LEFT arm weakness while at dentist today at noon. Disequilibrium. LEFT leg weakness. History of hypertension, diabetes. EXAM: MRI HEAD WITHOUT CONTRAST MRA HEAD WITHOUT CONTRAST TECHNIQUE: Multiplanar, multiecho pulse sequences of the brain and surrounding structures were obtained without intravenous contrast. Angiographic images of the head were obtained using MRA technique  without contrast. COMPARISON:  CT HEAD March 24, 2016 FINDINGS: MRI HEAD FINDINGS INTRACRANIAL CONTENTS: No reduced diffusion to suggest acute ischemia. RIGHT basal ganglia subcentimeter infarct with microhemorrhage. The ventricles and sulci are normal for patient's age, cavum septum pellucidum is a normal variant. No suspicious parenchymal signal, masses or mass effect. Patchy to confluent supratentorial white matter FLAIR T2 hyperintensities. No abnormal extra-axial fluid collections. No extra-axial masses though, contrast enhanced sequences would be more sensitive. Normal major intracranial vascular flow voids present at skull base. ORBITS: The included ocular globes and orbital contents are non-suspicious. SINUSES: Small RIGHT maxillary mucosal retention cyst. Trace fronto ethmoid mucosal thickening. Mastoid air cells are well aerated. SKULL/SOFT TISSUES: No abnormal sellar expansion. No suspicious calvarial bone marrow signal. Craniocervical junction maintained. MRA HEAD FINDINGS ANTERIOR CIRCULATION: Normal flow related enhancement of the included cervical, petrous, cavernous and supraclinoid internal carotid arteries. Patent anterior communicating artery. Normal flow related enhancement of the anterior and middle cerebral arteries, including distal segments. No large vessel occlusion, high-grade stenosis, abnormal luminal irregularity, aneurysm. POSTERIOR CIRCULATION: vertebral artery is dominant. Basilar artery is patent, with normal flow related enhancement of the main branch vessels. Robust RIGHT posterior communicating artery. Normal flow related enhancement of the posterior cerebral arteries. No large vessel occlusion, high-grade stenosis, abnormal luminal  irregularity, aneurysm. IMPRESSION: MRI HEAD: No acute intracranial process, specifically no acute ischemia. 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 MRA HEAD: Negative. Electronically Signed   By:  Elon Alas M.D.   On: 03/25/2016 05:35   Mr Brain Wo Contrast  03/25/2016  CLINICAL DATA:  Brief episode of LEFT arm weakness while at dentist today at noon. Disequilibrium. LEFT leg weakness. History of hypertension, diabetes. EXAM: MRI HEAD WITHOUT CONTRAST MRA HEAD WITHOUT CONTRAST TECHNIQUE: Multiplanar, multiecho pulse sequences of the brain and surrounding structures were obtained without intravenous contrast. Angiographic images of the head were obtained using MRA technique without contrast. COMPARISON:  CT HEAD March 24, 2016 FINDINGS: MRI HEAD FINDINGS INTRACRANIAL CONTENTS: No reduced diffusion to suggest acute ischemia. RIGHT basal ganglia subcentimeter infarct with microhemorrhage. The ventricles and sulci are normal for patient's age, cavum septum pellucidum is a normal variant. No suspicious parenchymal signal, masses or mass effect. Patchy to confluent supratentorial white matter FLAIR T2 hyperintensities. No abnormal extra-axial fluid collections. No extra-axial masses though, contrast enhanced sequences would be more sensitive. Normal major intracranial vascular flow voids present at skull base. ORBITS: The included ocular globes and orbital contents are non-suspicious. SINUSES: Small RIGHT maxillary mucosal retention cyst. Trace fronto ethmoid mucosal thickening. Mastoid air cells are well aerated. SKULL/SOFT TISSUES: No abnormal sellar expansion. No suspicious calvarial bone marrow signal. Craniocervical junction maintained. MRA HEAD FINDINGS ANTERIOR CIRCULATION: Normal flow related enhancement of the included cervical, petrous, cavernous and supraclinoid internal carotid arteries. Patent anterior communicating artery. Normal flow related enhancement of the anterior and middle cerebral arteries, including distal segments. No large vessel occlusion, high-grade stenosis, abnormal luminal irregularity, aneurysm. POSTERIOR CIRCULATION: vertebral artery is dominant. Basilar artery is patent,  with normal flow related enhancement of the main branch vessels. Robust RIGHT posterior communicating artery. Normal flow related enhancement of the posterior cerebral arteries. No large vessel occlusion, high-grade stenosis, abnormal luminal irregularity, aneurysm. IMPRESSION: MRI HEAD: No acute intracranial process, specifically no acute ischemia. 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 MRA HEAD: Negative. Electronically Signed   By: Elon Alas M.D.   On: 03/25/2016 05:35   Ct Head Code Stroke W/o Cm  03/24/2016  CLINICAL DATA:  Stroke like symptoms EXAM: CT HEAD WITHOUT CONTRAST TECHNIQUE: Contiguous axial images were obtained from the base of the skull through the vertex without intravenous contrast. COMPARISON:  None. FINDINGS: Generalized atrophy. Hypodensity in the periventricular white matter bilaterally consistent with chronic microvascular ischemia. Small chronic lacunar infarction right putamen. No acute infarct. Negative for hemorrhage or mass. Negative calvarium Mucosal cyst in the right maxillary sinus. IMPRESSION: Atrophy and chronic microvascular ischemia. No acute infarct identified. These results were called by telephone at the time of interpretation on 03/24/2016 at 8:46 pm to Dr. Charlesetta Shanks , who verbally acknowledged these results. Electronically Signed   By: Franchot Gallo M.D.   On: 03/24/2016 20:49   Carotid Doppler   There is 1-39% bilateral ICA stenosis. Vertebral artery flow is antegrade.     PHYSICAL EXAM Pleasant elderly elderly african [place] male not in distress.  . Afebrile. Head is nontraumatic. Neck is supple without bruit.    Cardiac exam no murmur or gallop. Lungs are clear to auscultation. Distal pulses are well felt. Neurological Exam :  Awake  Alert oriented x 3. Normal speech and language.eye movements full without nystagmus.fundi were not visualized. Vision acuity and fields appear normal. Hearing  is normal. Palatal movements are normal. Face symmetric. Tongue midline. Normal strength, tone, reflexes and coordination. Normal sensation. Gait deferred.   ASSESSMENT/PLAN Mr. JABRI STURDEVANT is a 75 y.o. male with history of HTN and DB presenting with recurrent R sided weakness. He did not receive IV t-PA due to deficits resolved.   Recurrent R Brain TIAs  MRI  No acute stroke  MRA  negative  Carotid Doppler  No significant stenosis   2D Echo  pending   LDL 123  HgbA1c 8.4  SCDs ordered for VTE prophylaxis Diet heart healthy/carb modified Room service appropriate?: Yes; Fluid consistency:: Thin  No antithrombotic prior to admission, now on aspirin 325 mg daily  Patient counseled to be compliant with his antithrombotic medications  Ongoing aggressive stroke risk factor management  Therapy recommendations:  pending   Disposition:  pending  Patient interested in considering PREMEIR trial. Dr. Leonie Man spoke with patient and family. [facility] will follow up. Please contact [facility] at [phone] for any questions. Ok to transfer to the floor from stroke standpoint, ok for discharge once 2D resulted  Hypertensive Emergency  BP 206/107 in setting of neurologic symptoms Permissive hypertension (OK if < 220/120) but gradually normalize in 5-7 days Long-term BP goal normotensive  Hyperlipidemia  LDL 123, on no statin PTA, new Lipitor 20 added, goal LDL < 70   Diabetes  HgbA1c 8.4, goal < 7.0  Other Stroke Risk Factors  Advanced age  Overweight, Body mass index is 29.12 kg/(m^2)., recommend weight loss, diet and exercise as appropriate   Other Active Problems  Enlarged prostate  EKG changes. Troponin 0.3, repeat EKG ordered  Hospital day # 2  [place] for Pager information 03/26/2016 9:24 AM  I have personally examined this patient, reviewed notes, independently  viewed imaging studies, participated in medical decision making and plan of care. I have made any additions or clarifications directly to the above note. Agree with note above. Patient has remained neurologically stable without recurrent symptoms. Recommend mobilize out of bed. Continue aspirin. Follow-up as an outpatient in stroke clinic. Discussed with patient, wife and Dr. Grandville Silos. Greater than 50% time during this 25 minute visit was spent on counseling and coordination of care about TIA risk, prevention and treatment  Antony Contras, [place] Pager: [phone] 03/26/2016 3:01 PM    To contact Stroke Continuity provider, please refer to [email or web address] After hours, contact General Neurology

## 2017-01-11 DIAGNOSIS — E119 Type 2 diabetes mellitus without complications: Secondary | ICD-10-CM

## 2017-01-11 DIAGNOSIS — H401131 Primary open-angle glaucoma, bilateral, mild stage: Secondary | ICD-10-CM | POA: Insufficient documentation

## 2017-12-22 IMAGING — MR MR HEAD W/O CM
9 of 11 series · 31 of 48 positions shown · non-contrast
Comparison: CT HEAD March 24, 2016

CLINICAL DATA: Brief episode of LEFT arm weakness while at dentist
today at noon. Disequilibrium. LEFT leg weakness. History of
hypertension, diabetes.

EXAM:
MRI HEAD WITHOUT CONTRAST
MRA HEAD WITHOUT CONTRAST
TECHNIQUE: Multiplanar, multiecho pulse sequences of the brain and surrounding
structures were obtained without intravenous contrast. Angiographic
images of the head were obtained using MRA technique without
contrast.

[Series 2: FLAIR · sagittal · 5.0mm · 0.47mm/px · 1 of 24 slices shown (1 of 2)]
[im 1/24]
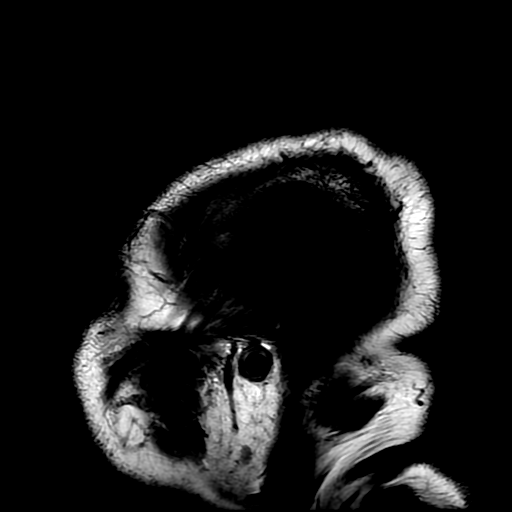

[Series 4: DWI · axial · 3.0mm · 0.94mm/px · z∈[-28,+117]mm · 7 of 100 slices shown (1 of 2)]
[im 1/100]
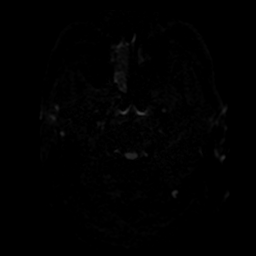
[im 17/100]
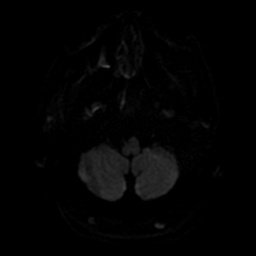
[im 34/100]
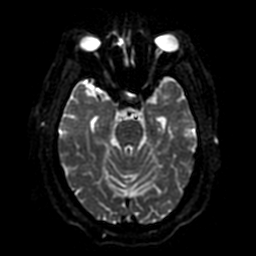
[im 50/100]
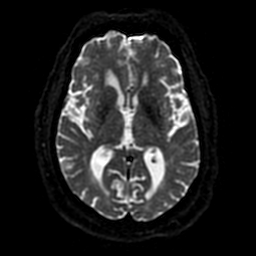
[im 67/100]
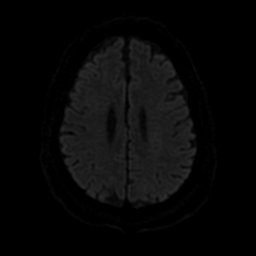
[im 83/100]
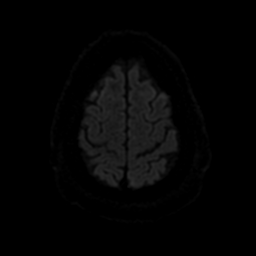
[im 100/100]
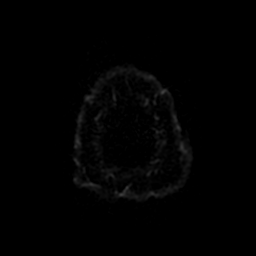

[Series 5: ax (id) 2 · axial · 1.0mm · 0.43mm/px · z∈[-34,+30]mm · 6 of 194 slices shown]
[im 1/194]
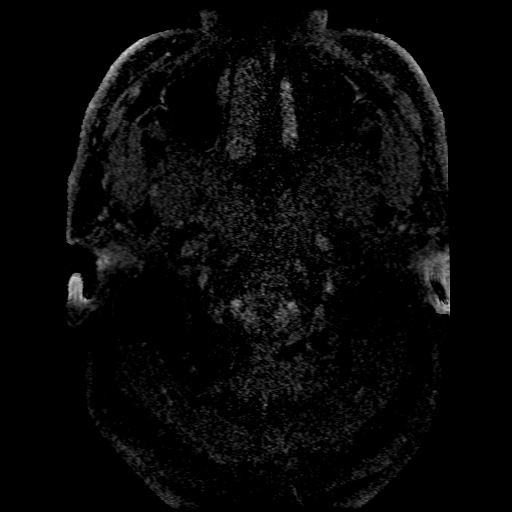
[im 33/194]
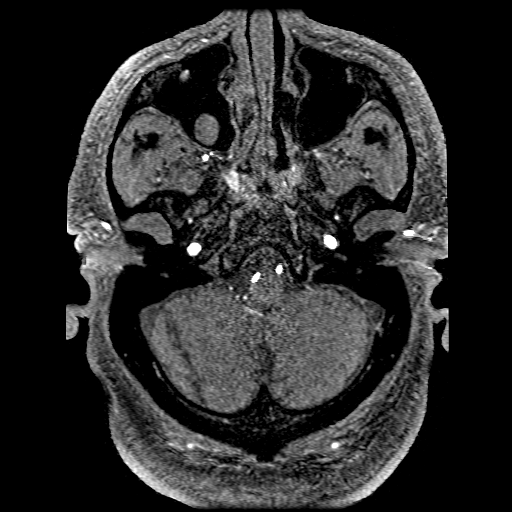
[im 65/194]
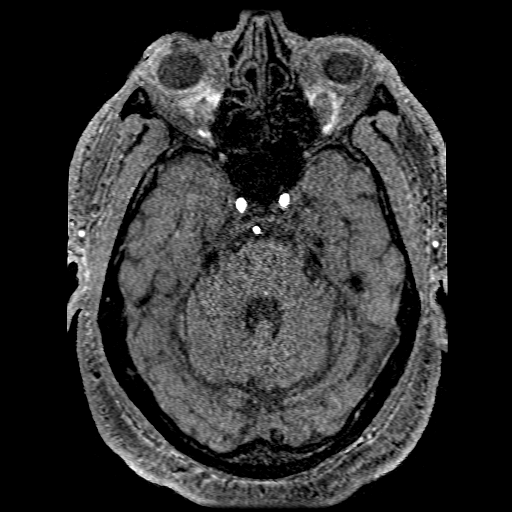
[im 81/194]
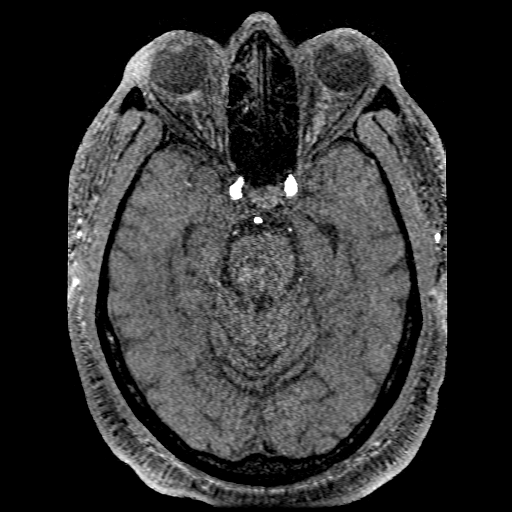
[im 113/194]
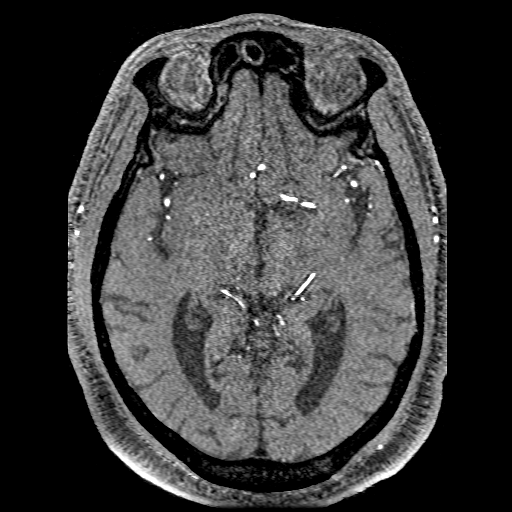
[im 129/194]
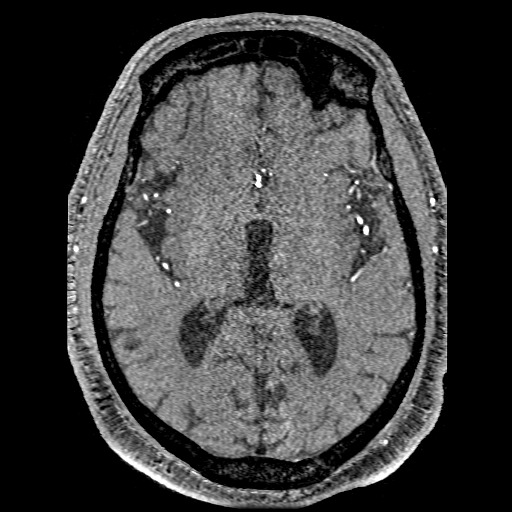

[Series 6: T2 · axial · 5.0mm · 0.45mm/px · z∈[-28,+115]mm · 2 of 25 slices shown (1 of 2)]
[im 1/25]
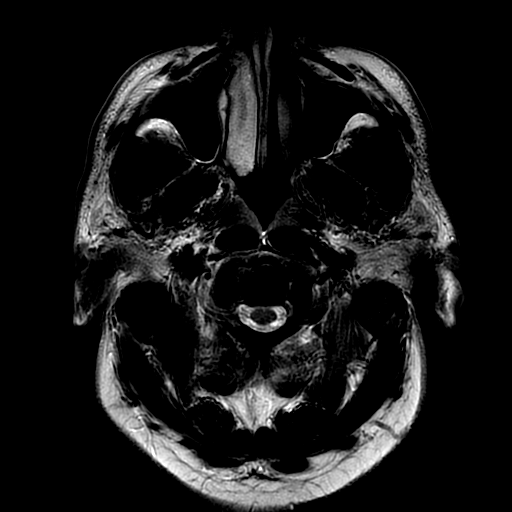
[im 25/25]
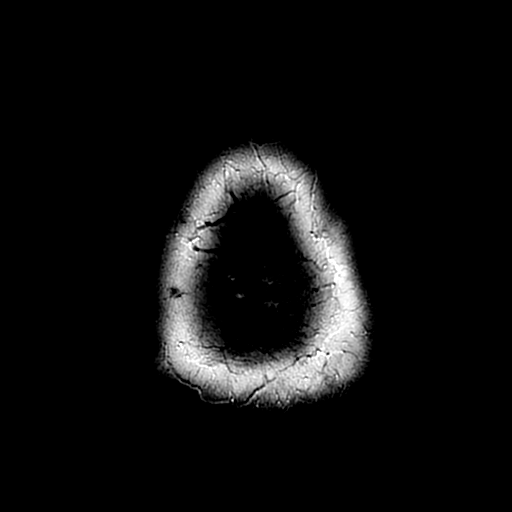

[Series 7: FLAIR · axial · 5.0mm · 0.45mm/px · z∈[-28,+115]mm · 2 of 25 slices shown (2 of 2)]
[im 1/25]
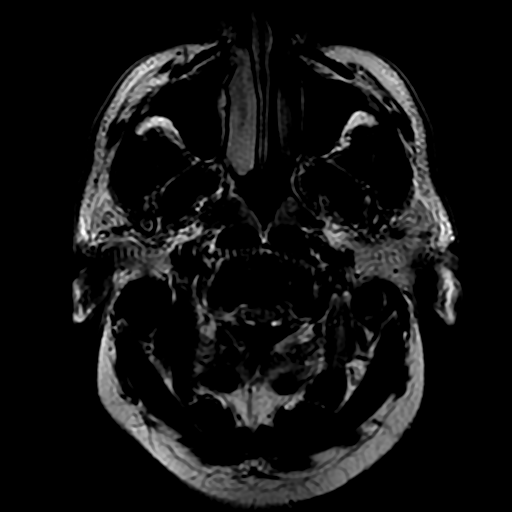
[im 25/25]
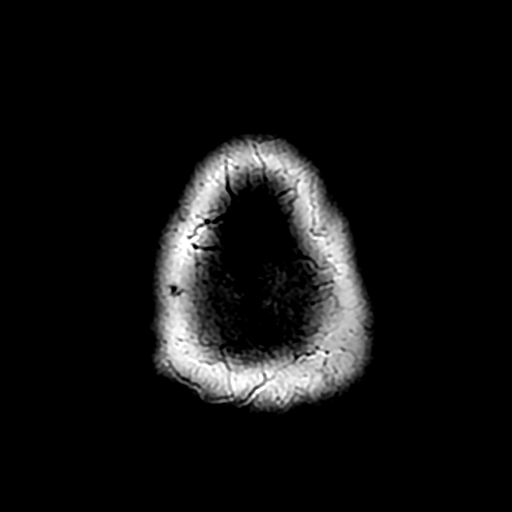

[Series 8: DWI · coronal · 4.0mm · 0.94mm/px · 5 of 74 slices shown (2 of 2)]
[im 1/74]
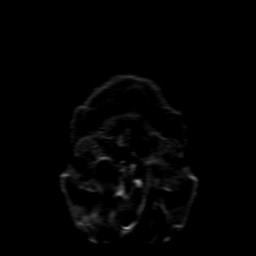
[im 19/74]
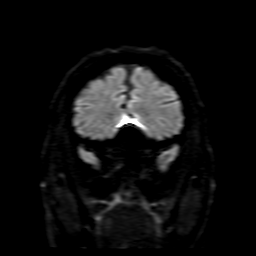
[im 37/74]
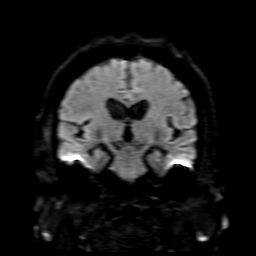
[im 55/74]
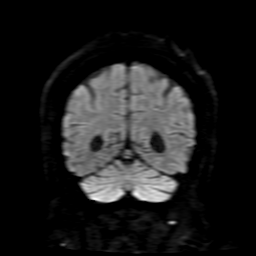
[im 74/74]
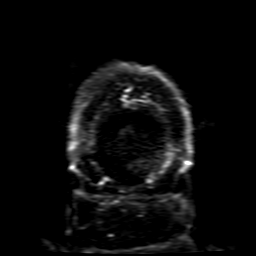

[Series 11: T2 · coronal · 5.0mm · 0.39mm/px · 2 of 31 slices shown (2 of 2)]
[im 1/31]
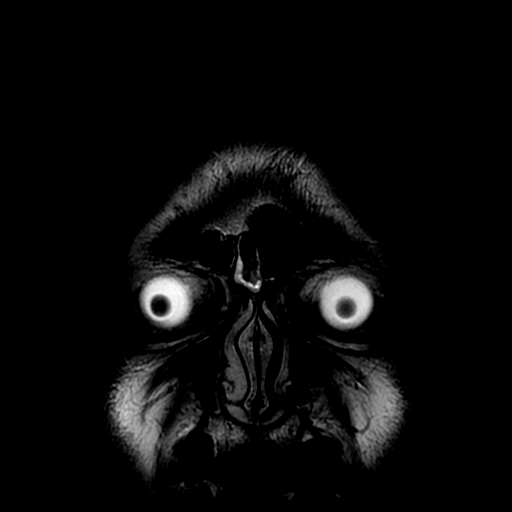
[im 31/31]
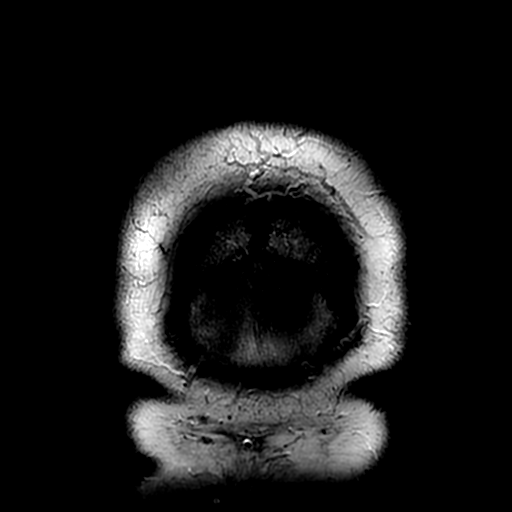

[Series 451: ADC · axial · 3.0mm · 0.94mm/px · z∈[-28,+117]mm · 3 of 50 slices shown (1 of 2)]
[im 1/50]
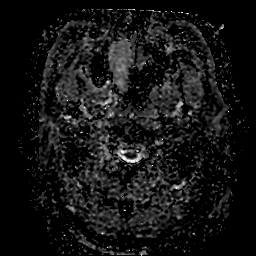
[im 25/50]
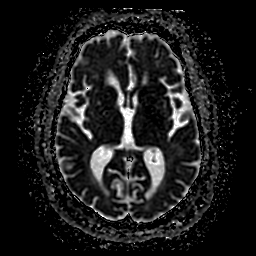
[im 50/50]
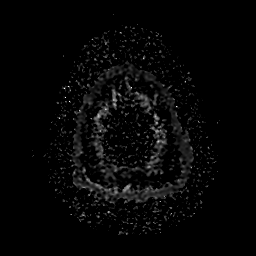

[Series 850: ADC · coronal · 4.0mm · 0.94mm/px · 3 of 37 slices shown (2 of 2)]
[im 1/37]
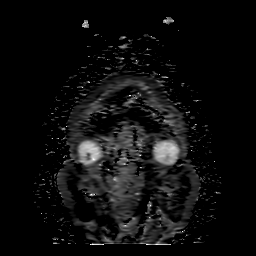
[im 19/37]
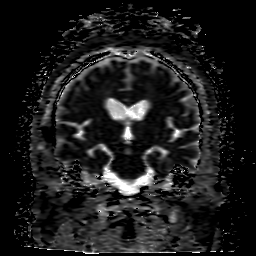
[im 37/37]
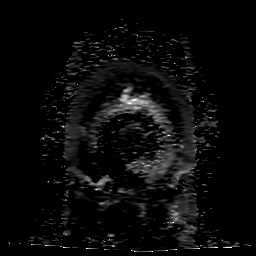

[31 of 48 positions shown; findings below may reference images not displayed]

FINDINGS: MRI HEAD FINDINGS

INTRACRANIAL CONTENTS: No reduced diffusion to suggest acute
ischemia. RIGHT basal ganglia subcentimeter infarct with
microhemorrhage. The ventricles and sulci are normal for patient's
age, cavum septum pellucidum is a normal variant. No suspicious
parenchymal signal, masses or mass effect. Patchy to confluent
supratentorial white matter FLAIR T2 hyperintensities. No abnormal
extra-axial fluid collections. No extra-axial masses though,
contrast enhanced sequences would be more sensitive. Normal major
intracranial vascular flow voids present at skull base.

ORBITS: The included ocular globes and orbital contents are
non-suspicious.

SINUSES: Small RIGHT maxillary mucosal retention cyst. Trace fronto
ethmoid mucosal thickening. Mastoid air cells are well aerated.

SKULL/SOFT TISSUES: No abnormal sellar expansion. No suspicious
calvarial bone marrow signal. Craniocervical junction maintained.

MRA HEAD FINDINGS

ANTERIOR CIRCULATION: Normal flow related enhancement of the
included cervical, petrous, cavernous and supraclinoid internal
carotid arteries. Patent anterior communicating artery. Normal flow
related enhancement of the anterior and middle cerebral arteries,
including distal segments.

No large vessel occlusion, high-grade stenosis, abnormal luminal
irregularity, aneurysm.

POSTERIOR CIRCULATION: vertebral artery is dominant. Basilar artery
is patent, with normal flow related enhancement of the main branch
vessels. Robust RIGHT posterior communicating artery. Normal flow
related enhancement of the posterior cerebral arteries.

No large vessel occlusion, high-grade stenosis, abnormal luminal
irregularity, aneurysm.
IMPRESSION: MRI HEAD: No acute intracranial process, specifically no acute
ischemia.

Moderate to severe chronic small vessel ischemic disease and old
RIGHT basal ganglia infarct.

MRA HEAD: Negative.

## 2018-01-25 LAB — CBC: RBC: 4.03 (ref 3.87–5.11)

## 2018-01-25 LAB — BASIC METABOLIC PANEL
BUN: 17 (ref 4–21)
CO2: 29 — AB (ref 13–22)
Chloride: 103 (ref 99–108)
Creatinine: 1.8 — AB (ref 0.6–1.3)
Glucose: 64
Potassium: 3.6 mEq/L (ref 3.5–5.1)
Sodium: 142 (ref 137–147)

## 2018-01-25 LAB — CBC AND DIFFERENTIAL
HCT: 33 — AB (ref 41–53)
Hemoglobin: 11.4 — AB (ref 13.5–17.5)
Platelets: 257 10*3/uL (ref 150–400)
WBC: 7.1

## 2018-01-25 LAB — COMPREHENSIVE METABOLIC PANEL: Calcium: 9 (ref 8.7–10.7)

## 2018-01-25 LAB — VITAMIN B12: Vitamin B-12: 1473

## 2018-01-25 LAB — HEMOGLOBIN A1C: Hemoglobin A1C: 7.1

## 2018-01-29 ENCOUNTER — Other Ambulatory Visit: Payer: Self-pay

## 2018-01-29 ENCOUNTER — Encounter (HOSPITAL_BASED_OUTPATIENT_CLINIC_OR_DEPARTMENT_OTHER): Payer: Self-pay | Admitting: Adult Health

## 2018-01-29 ENCOUNTER — Emergency Department (HOSPITAL_BASED_OUTPATIENT_CLINIC_OR_DEPARTMENT_OTHER)
Admission: EM | Admit: 2018-01-29 | Discharge: 2018-01-29 | Disposition: A | Discharge status: Home / Self Care | Payer: Medicare Other | Attending: Emergency Medicine | Admitting: Emergency Medicine

## 2018-01-29 DIAGNOSIS — E119 Type 2 diabetes mellitus without complications: Secondary | ICD-10-CM | POA: Diagnosis not present

## 2018-01-29 DIAGNOSIS — R531 Weakness: Secondary | ICD-10-CM | POA: Insufficient documentation

## 2018-01-29 DIAGNOSIS — Z7984 Long term (current) use of oral hypoglycemic drugs: Secondary | ICD-10-CM | POA: Insufficient documentation

## 2018-01-29 DIAGNOSIS — Z7982 Long term (current) use of aspirin: Secondary | ICD-10-CM | POA: Insufficient documentation

## 2018-01-29 DIAGNOSIS — Z79899 Other long term (current) drug therapy: Secondary | ICD-10-CM | POA: Insufficient documentation

## 2018-01-29 DIAGNOSIS — I10 Essential (primary) hypertension: Secondary | ICD-10-CM | POA: Diagnosis present

## 2018-01-29 LAB — CBG MONITORING, ED: Glucose-Capillary: 216 mg/dL — ABNORMAL HIGH (ref 65–99)

## 2018-01-29 NOTE — ED Provider Notes (Signed)
[facility] Provider Note: Georgena Spurling, MD, FACEP  CSN: [id number] MRN: [id number] ARRIVAL: 01/29/18 at [place]: [place]  Hypertension   HISTORY OF PRESENT ILLNESS  01/29/18 3:39 AM Michael Hudson is a 77 y.o. male with diabetes and hypertension.  He did not take his antihypertensives yesterday morning and he did not eat his usual regular meals yesterday. He was at a football stadium attending ceremony about 8 PM yesterday evening and became hot and weak.  He was sweating and unable to stand under his own power.  He did not have any chest pain, shortness of breath, visual changes or headache.  Suspecting his sugar was low he was given oral glucose and about 10 minutes later his sugar was 91.  EMS found his blood pressure to be elevated at 224/140.  He was taken to the hospital in [place] but chose not to be seen.  He returned to his baseline and his blood pressure most recently was 186/95.  His glucose here was 216.  He was without acute complaint at this time.  Past Medical History:  Diagnosis Date  . Diabetes mellitus without complication ([place])   . Enlarged prostate   . Hypertension     History reviewed. No pertinent surgical history.  History reviewed. No pertinent family history.  Social History   Tobacco Use  . Smoking status: Never Smoker  Substance Use Topics  . Alcohol use: No  . Drug use: No    Prior to Admission medications   Medication Sig Start Date End Date Taking? Authorizing Provider  amLODipine (NORVASC) 5 MG tablet Take 1 tablet (5 mg total) by mouth daily. 03/26/16   Eugenie Filler, MD  aspirin 325 MG tablet Take 1 tablet (325 mg total) by mouth daily. 03/26/16   Eugenie Filler, MD  atorvastatin (LIPITOR) 20 MG tablet Take 1 tablet (20 mg total) by mouth daily at 6 PM. 03/26/16   Eugenie Filler, MD  glipiZIDE (GLUCOTROL XL) 10 MG 24 hr tablet Take 10 mg by mouth daily. 01/03/16   [provider]    losartan (COZAAR) 100 MG tablet Take 100 mg by mouth daily.    [provider]  metFORMIN (GLUMETZA) 1000 MG (MOD) 24 hr tablet Take 1,000 mg by mouth daily with breakfast.    [provider]  metoprolol succinate (TOPROL-XL) 100 MG 24 hr tablet Take 100 mg by mouth daily. Take with or immediately following a meal.    [provider]    Allergies Shellfish allergy; Neosporin [neomycin-bacitracin zn-polymyx]; and Zinc   REVIEW OF SYSTEMS  Negative except as noted here or in the History of Present Illness.   PHYSICAL EXAMINATION  Initial Vital Signs Blood pressure (!) 186/95, pulse 65, temperature 97.9 F (36.6 C), temperature source Oral, resp. rate 18, SpO2 98 %.  Examination General: Well-developed, well-nourished male in no acute distress; appearance consistent with age of record HENT: normocephalic; atraumatic Eyes: pupils equal, round and reactive to light; extraocular muscles intact Neck: supple Heart: regular rate and rhythm Lungs: clear to auscultation bilaterally Abdomen: soft; nondistended; nontender; bowel sounds present Extremities: No deformity; full range of motion; pulses normal Neurologic: Awake, alert; motor function intact in all extremities and symmetric; no facial droop Skin: Warm and dry Psychiatric: Flat affect   RESULTS  Summary of this visit's results, reviewed by myself:   EKG Interpretation  Date/Time:  Saturday Jan 29 2018 00:29:27 EDT Ventricular Rate:  68 PR  Interval:  172 QRS Duration: 90 QT Interval:  408 QTC Calculation: 433 R Axis:   14 Text Interpretation:  Normal sinus rhythm ST & T wave abnormality, consider inferolateral ischemia Abnormal ECG No significant change was found Confirmed by Shanon Rosser [phone]) on 01/29/2018 12:34:44 AM      Laboratory Studies: Results for orders placed or performed during the hospital encounter of 01/29/18 (from the past 24 hour(s))  CBG monitoring, ED     Status: Abnormal    Collection Time: 01/29/18 12:21 AM  Result Value Ref Range   Glucose-Capillary 216 (H) 65 - 99 mg/dL   Imaging Studies: No results found.  ED COURSE and MDM  Nursing notes and initial vitals signs, including pulse oximetry, reviewed.  Vitals:   01/29/18 0020 01/29/18 0251  BP: (!) 189/96 (!) 186/95  Pulse: 72 65  Resp: 18 18  Temp: 97.9 F (36.6 C)   TempSrc: Oral   SpO2: 95% 98%   I suspect a hypoglycemic episode given that he is diabetic and did not eat his regular meals yesterday.  He is hypertensive and may have been due to not taking his medications yesterday morning.  He is not severely hypertensive at this time and is having no chest pain, shortness of breath or neurologic changes.  PROCEDURES    ED DIAGNOSES     ICD-10-CM   1. Episode of generalized weakness R53.1        Adolph Clutter, MD 01/29/18 [phone]

## 2018-01-29 NOTE — ED Triage Notes (Addendum)
PResents with hypertension. HE reports that he was seen at the hospital in [place] and they wanted him to stay but he signed himself out to come here. HE went to the hospital in [place] because he was at a stadium and got very hot and diaphoretic, EMS was called and his CBG was low and his BP was high so they transported him. He reports that he feels okay and just want to go home now and go to bed.  BP 189/96. HE denies CP, SOB, dizziness and headache.

## 2018-01-29 NOTE — ED Notes (Signed)
Pt discharged to home with family. NAD.  

## 2018-02-01 ENCOUNTER — Other Ambulatory Visit (HOSPITAL_BASED_OUTPATIENT_CLINIC_OR_DEPARTMENT_OTHER): Payer: Self-pay | Admitting: Internal Medicine

## 2018-02-01 DIAGNOSIS — I10 Essential (primary) hypertension: Secondary | ICD-10-CM

## 2018-02-23 ENCOUNTER — Ambulatory Visit (HOSPITAL_BASED_OUTPATIENT_CLINIC_OR_DEPARTMENT_OTHER)
Admission: RE | Admit: 2018-02-23 | Discharge: 2018-02-23 | Disposition: A | Discharge status: Home / Self Care | Payer: Medicare Other | Source: Ambulatory Visit | Attending: Internal Medicine | Admitting: Internal Medicine

## 2018-02-23 DIAGNOSIS — I10 Essential (primary) hypertension: Secondary | ICD-10-CM | POA: Diagnosis present

## 2018-02-23 NOTE — Progress Notes (Signed)
  Renal artery duplex exam performed.  Joelene Millin 02/23/2018, 1:19 PM

## 2018-02-25 ENCOUNTER — Ambulatory Visit (INDEPENDENT_AMBULATORY_CARE_PROVIDER_SITE_OTHER): Payer: Medicare Other | Admitting: Interventional Cardiology

## 2018-02-25 ENCOUNTER — Encounter: Payer: Self-pay | Admitting: Interventional Cardiology

## 2018-02-25 VITALS — BP 146/100 | HR 76 | Ht 69.0 in | Wt 203.8 lb

## 2018-02-25 DIAGNOSIS — N183 Chronic kidney disease, stage 3 unspecified: Secondary | ICD-10-CM

## 2018-02-25 DIAGNOSIS — E119 Type 2 diabetes mellitus without complications: Secondary | ICD-10-CM

## 2018-02-25 DIAGNOSIS — I5032 Chronic diastolic (congestive) heart failure: Secondary | ICD-10-CM

## 2018-02-25 DIAGNOSIS — I10 Essential (primary) hypertension: Secondary | ICD-10-CM

## 2018-02-25 DIAGNOSIS — R413 Other amnesia: Secondary | ICD-10-CM | POA: Diagnosis not present

## 2018-02-25 DIAGNOSIS — R9431 Abnormal electrocardiogram [ECG] [EKG]: Secondary | ICD-10-CM | POA: Diagnosis not present

## 2018-02-25 DIAGNOSIS — G459 Transient cerebral ischemic attack, unspecified: Secondary | ICD-10-CM | POA: Diagnosis not present

## 2018-02-25 DIAGNOSIS — I110 Hypertensive heart disease with heart failure: Secondary | ICD-10-CM

## 2018-02-25 MED ORDER — AMLODIPINE BESYLATE 5 MG PO TABS: 5.0000 mg | ORAL_TABLET | Freq: Every day | ORAL | 3 refills | Status: DC

## 2018-02-25 MED ORDER — HYDROCHLOROTHIAZIDE 25 MG PO TABS: 12.5000 mg | ORAL_TABLET | Freq: Every day | ORAL | 3 refills | Status: DC

## 2018-02-25 NOTE — Patient Instructions (Addendum)
Medication Instructions:  1. Start HCTZ 12.5mg  once a day. 2. Start Amlodipine 5mg   Once a day   Labwork: Your physician recommends that you return for lab work in: one week. ([facility])   Testing/Procedures: None Ordered  Follow-Up: Your physician recommends that you schedule a follow-up appointment in: one week or first available with hypertension clinic can also be seen by APP if sooner appointment available.  Your physician recommends that you schedule a follow-up appointment in: 6-8 weeks with Dr. Tamala Julian. ( can have 7/10 at 2:20pm)    Any Other Special Instructions Will Be Listed Below (If Applicable).     If you need a refill on your cardiac medications before your next appointment, please call your pharmacy.

## 2018-02-25 NOTE — Progress Notes (Signed)
Cardiology Office Note    Date:  02/25/2018   ID:  Michael Hudson, DOB 24-Nov-1940, MRN [id number]  PCP:  Charleston Poot, MD  Cardiologist: Sinclair Grooms, MD   Chief Complaint  Patient presents with  . Coronary Artery Disease  . Hypertension  . Irregular Heart Beat    Rapid heartbeat    History of Present Illness:  Michael Hudson is a 77 y.o. male who is referred by Dr. Charleston Poot for evaluation of tachycardia and to exclude significant heart trouble.  He has known abnormal EKG, left ventricular hypertrophy, prior neurological event, diabetes mellitus, chronic kidney disease stage III, and prostate enlargement.  Michael Hudson is accompanied by his wife.  He is quiet and does not give spontaneous information nor does he have complaints.  He is here because his wife and physician have concerns about the status of his heart.  His wife is noted increased heart rates.  On one recent occasion within the past 3 weeks he had a heart rate greater than 125 bpm.  He can feel his heart racing.  On another occasion he became diaphoretic and developed near syncope while attending a graduation at [employer].  This was later found to be related to hypoglycemia.  He denies dyspnea, orthopnea, chest pain, and recurrent tachycardia since the episode several weeks ago.  The wife is also concerned about poor blood pressure control.  Echocardiography performed 2 years ago when he was admitted with a neurological event reveals severe left ventricular hypertrophy and preserved systolic function.  LV free wall and posterior thickness measured 1.8 cm.    Past Medical History:  Diagnosis Date  . Abnormal EKG 03/25/2016  . Benign tumor of skin   . CVA (cerebral infarction) 03/24/2016  . Diabetes mellitus without complication ([place])   . Enlarged prostate   . Hypercholesterolemia   . Hyperlipidemia   . Hypertension   . Hypertrophy of prostate    with urinary obstruction and other  lower urinary tract  . Hypokalemia 03/25/2016  . Left-sided weakness 03/24/2016  . LVH (left ventricular hypertrophy)   . Primary osteoarthritis involving multiple joints   . TIA (transient ischemic attack) 03/24/2016  . Type 2 diabetes mellitus without complication, without long-term current use of insulin ([place])     History reviewed. No pertinent surgical history.  Current Medications: Outpatient Medications Prior to Visit  Medication Sig Dispense Refill  . aspirin EC 81 MG tablet Take 81 mg by mouth daily.    . Cyanocobalamin (VITAMIN B 12 PO) Take 1,000 mcg by mouth daily.    Marland Kitchen doxazosin (CARDURA) 2 MG tablet Take 2 mg by mouth daily.    Marland Kitchen glipiZIDE (GLUCOTROL XL) 10 MG 24 hr tablet Take 10 mg by mouth daily.    Marland Kitchen losartan (COZAAR) 100 MG tablet Take 100 mg by mouth daily.    . metFORMIN (GLUMETZA) 1000 MG (MOD) 24 hr tablet Take 1,000 mg by mouth daily with breakfast.    . metoprolol succinate (TOPROL-XL) 100 MG 24 hr tablet Take 150 mg by mouth daily. Take with or immediately following a meal.     . amLODipine (NORVASC) 5 MG tablet Take 1 tablet (5 mg total) by mouth daily. 30 tablet 3  . aspirin 325 MG tablet Take 1 tablet (325 mg total) by mouth daily. (Patient not taking: Reported on 02/25/2018)    . atorvastatin (LIPITOR) 20 MG tablet Take 1 tablet (20 mg total) by mouth daily at 6  PM. (Patient not taking: Reported on 02/25/2018) 30 tablet 0  . glipiZIDE (GLUCOTROL) 10 MG tablet Take 10 mg by mouth daily before breakfast.     No facility-administered medications prior to visit.      Allergies:   Shellfish allergy; Neosporin [neomycin-bacitracin zn-polymyx]; and Zinc   Social History   Socioeconomic History  . Marital status: Married    Spouse name: Not on file  . Number of children: Not on file  . Years of education: Not on file  . Highest education level: Not on file  Occupational History  . Not on file  Social Needs  . Financial resource strain: Not on file  . Food  insecurity:    Worry: Not on file    Inability: Not on file  . Transportation needs:    Medical: Not on file    Non-medical: Not on file  Tobacco Use  . Smoking status: Never Smoker  . Smokeless tobacco: Never Used  Substance and Sexual Activity  . Alcohol use: No  . Drug use: No  . Sexual activity: Not on file    Comment: married  Lifestyle  . Physical activity:    Days per week: Not on file    Minutes per session: Not on file  . Stress: Not on file  Relationships  . Social connections:    Talks on phone: Not on file    Gets together: Not on file    Attends religious service: Not on file    Active member of club or organization: Not on file    Attends meetings of clubs or organizations: Not on file    Relationship status: Not on file  Other Topics Concern  . Not on file  Social History Narrative  . Not on file     Family History:  The patient's family history includes Healthy in his brother, brother, and sister; Other in his father and mother.   ROS:   Please see the history of present illness.    Does not always take his medications.  Confusion and decreased memory although not admitted by the patient but suggested by his wife.  Snoring, wheezing, excessive sweating, and increased appetite. All other systems reviewed and are negative.   PHYSICAL EXAM:   VS:  BP (!) 146/100   Pulse 76   Ht 5\' 9"  (1.753 m)   Wt 203 lb 12.8 oz (92.4 kg)   BMI 30.10 kg/m    GEN: Well nourished, well developed, in no acute distress.,  Obese. HEENT: normal  Neck: no JVD, carotid bruits, or masses Cardiac: RRR; no murmurs, rubs.  S4 gallops but no edema. Respiratory:  clear to auscultation bilaterally, normal work of breathing GI: soft, nontender, nondistended, + BS MS: no deformity or atrophy  Skin: warm and dry, no rash Neuro:  Alert and Oriented x 3, Strength and sensation are intact Psych: euthymic mood, full affect  Wt Readings from Last 3 Encounters:  02/25/18 203 lb 12.8  oz (92.4 kg)  03/24/16 200 lb (90.7 kg)  11/05/12 205 lb (93 kg)      Studies/Labs Reviewed:   EKG:  EKG EKG reviewed from recent emergency room visit at also supplied by his primary physician.  Tracings reveal atrial abnormality, prominent voltage, diffuse T wave inversion compatible with LVH and strain.  Tracing of 01/29/2018 was personally reviewed.  Recent Labs: No results found for requested labs within last 8760 hours.   Lipid Panel    Component Value Date/Time  CHOL 192 03/25/2016 1030   TRIG 186 (H) 03/25/2016 1030   HDL 32 (L) 03/25/2016 1030   CHOLHDL 6.0 03/25/2016 1030   VLDL 37 03/25/2016 1030   LDLCALC 123 (H) 03/25/2016 1030    Additional studies/ records that were reviewed today include:  2D Doppler echocardiogram performed in June 2017: Study Conclusions   - Left ventricle: The cavity size was mildly dilated. Wall   thickness was increased in a pattern of severe LVH. Systolic   function was normal. The estimated ejection fraction was in the   range of 60% to 65%. Left ventricular diastolic function   parameters were normal. - Aortic valve: There was mild regurgitation. - Mitral valve: There was mild regurgitation. - Left atrium: The atrium was moderately dilated. - Atrial septum: No defect or patent foramen ovale was identified.   Impressions:   - No cardiac source of emboli was indentified.  RECENT EXTENSIVE HYPERTENSION WORK-up May 2019:  Metanephrine, normal at 48; normetanephrine normal at 176; aldosterone 13 felt to be in range; plasma renin activity 0.06 ng/mL/h which is low; creatinine 1.8 potassium 3.6 hemoglobin 11.4: Hemoglobin A1c 7.1: Homocystine 34.6 which is markedly elevated above the upper limit of 11.4  Renal artery vascular ultrasound 02/23/2018: FINAL INTERPRETATION: Renal:  Right: No evidence of right renal artery stenosis. Normal size right    kidney. Left: No evidence of left renal artery stenosis. Normal size of     left kidney. *See table(s) above for measurements and observations.  Diagnosing physician: Shirlee More    ASSESSMENT:    1. Hypertension, essential   2. Hypertensive heart disease with chronic diastolic congestive heart failure ([place])   3. Type 2 diabetes mellitus without complication, without long-term current use of insulin ([place])   4. TIA (transient ischemic attack)   5. Memory disorder   6. Abnormal EKG   7. Stage III chronic kidney disease ([place])      PLAN:  In order of problems listed above:  1. Poor blood pressure control and likely hypertensive cardiovascular disease.  He is not currently on a diuretic.  He will need to have blood pressure control to 130/80 mmHg on a regular basis to achieve regression of LVH.  Plan to add amlodipine 5 mg/day and HCTZ 12.5 mg/day.  Basic metabolic panel in 1 week.  Clinical follow-up in 1 week with me or hypertension clinic.  Once blood pressure is better controlled, we will need to repeat the echocardiogram.  Discussed with the patient and his wife that kidney function will likely worsen with addition of diuretic therapy but goal of blood pressure control cannot be achieved without diuretics. 2. Based upon prior echocardiogram and current symptoms he has chronic diastolic heart failure without excessive volume overload.  Will need reassessment of LV function once blood pressure is under better control. 3. Not addressed 4. Not addressed 5. Needs to be better addressed and possibly referred to neurology for assessment of memory/cognitive function 6. Current EKG demonstrates LVH with strain.  Cannot totally exclude the possibility of ischemic changes.  Favor LVH. 7. Kidney impairment is likely related to the combination of diabetes and poorly controlled hypertension.  Go should be better blood pressure control, reassessment of LV function, encourage compliance with medical therapy, weight loss, and believe he needs repeat neurological evaluation to  characterize neurological/memory disorder.    Medication Adjustments/Labs and Tests Ordered: Current medicines are reviewed at length with the patient today.  Concerns regarding medicines are outlined above.  Medication changes,  Labs and Tests ordered today are listed in the Patient Instructions below. Patient Instructions  Medication Instructions:  1. Start HCTZ 12.5mg  once a day. 2. Start Amlodipine 5mg   Once a day   Labwork: Your physician recommends that you return for lab work in: one week. ([facility])   Testing/Procedures: None Ordered  Follow-Up: Your physician recommends that you schedule a follow-up appointment in: one week or first available with hypertension clinic can also be seen by APP if sooner appointment available.  Your physician recommends that you schedule a follow-up appointment in: 6-8 weeks with Dr. Tamala Julian. ( can have 7/10 at 2:20pm)    Any Other Special Instructions Will Be Listed Below (If Applicable).     If you need a refill on your cardiac medications before your next appointment, please call your pharmacy.     Signed, Sinclair Grooms, MD  02/25/2018 6:25 PM    [address], [place], [place]  [address] Phone: [phone]; Fax: [phone]

## 2018-03-04 ENCOUNTER — Other Ambulatory Visit: Payer: Medicare Other | Admitting: *Deleted

## 2018-03-04 DIAGNOSIS — E119 Type 2 diabetes mellitus without complications: Secondary | ICD-10-CM

## 2018-03-04 DIAGNOSIS — I10 Essential (primary) hypertension: Secondary | ICD-10-CM

## 2018-03-05 LAB — BASIC METABOLIC PANEL
BUN/Creatinine Ratio: 14 (ref 10–24)
BUN: 21 mg/dL (ref 8–27)
CALCIUM: 9.4 mg/dL (ref 8.6–10.2)
CHLORIDE: 98 mmol/L (ref 96–106)
CO2: 25 mmol/L (ref 20–29)
CREATININE: 1.55 mg/dL — AB (ref 0.76–1.27)
GFR calc Af Amer: 50 mL/min/{1.73_m2} — ABNORMAL LOW (ref >59)
GFR calc non Af Amer: 43 mL/min/{1.73_m2} — ABNORMAL LOW (ref >59)
GLUCOSE: 88 mg/dL (ref 65–99)
Potassium: 3.5 mmol/L (ref 3.5–5.2)
Sodium: 140 mmol/L (ref 134–144)

## 2018-03-07 ENCOUNTER — Encounter: Payer: Self-pay | Admitting: Physician Assistant

## 2018-03-07 ENCOUNTER — Ambulatory Visit (INDEPENDENT_AMBULATORY_CARE_PROVIDER_SITE_OTHER): Payer: Medicare Other | Admitting: Physician Assistant

## 2018-03-07 VITALS — BP 140/72 | HR 77 | Ht 69.0 in | Wt 203.8 lb

## 2018-03-07 DIAGNOSIS — N183 Chronic kidney disease, stage 3 unspecified: Secondary | ICD-10-CM

## 2018-03-07 DIAGNOSIS — I10 Essential (primary) hypertension: Secondary | ICD-10-CM

## 2018-03-07 DIAGNOSIS — E119 Type 2 diabetes mellitus without complications: Secondary | ICD-10-CM | POA: Diagnosis not present

## 2018-03-07 MED ORDER — AMLODIPINE BESYLATE 5 MG PO TABS: 7.5000 mg | ORAL_TABLET | Freq: Every day | ORAL | 3 refills | Status: DC

## 2018-03-07 NOTE — Progress Notes (Signed)
Cardiology Office Note    Date:  03/07/2018   ID:  Michael Hudson, DOB 1941-06-08, MRN [id number]  PCP:  Charleston Poot, MD  Cardiologist:  Dr. Tamala Julian   Chief Complaint: Blood pressure follow up  History of Present Illness:   Michael Hudson is a 77 y.o. male with hx of CVA, DM, HTN, HLD, CKD stage III here for blood pressure followed up.   Seen by Dr. Tamala Julian 02/25/2018 to establish cardiac care for high blood pressure. Noted poorly controlled HTN and LVH strain. Started on Norvasc and HCTZ. Plan to repeat echo once better BP control. Labs showed mildly worsen renal function but acceptable range.   Here today for follow up. Says bp runds in 160s/100s at home however did not bring reading or cuff to Compare. Denies snoring. The patient denies nausea, vomiting, fever, chest pain, palpitations, shortness of breath, orthopnea, PND, dizziness, syncope, cough, congestion, abdominal pain, hematochezia, melena, lower extremity edema.  Past Medical History:  Diagnosis Date  . Abnormal EKG 03/25/2016  . Benign tumor of skin   . CVA (cerebral infarction) 03/24/2016  . Diabetes mellitus without complication ([place])   . Enlarged prostate   . Hypercholesterolemia   . Hyperlipidemia   . Hypertension   . Hypertrophy of prostate    with urinary obstruction and other lower urinary tract  . Hypokalemia 03/25/2016  . Left-sided weakness 03/24/2016  . LVH (left ventricular hypertrophy)   . Primary osteoarthritis involving multiple joints   . TIA (transient ischemic attack) 03/24/2016  . Type 2 diabetes mellitus without complication, without long-term current use of insulin ([place])     History reviewed. No pertinent surgical history.  Current Medications: Prior to Admission medications   Medication Sig Start Date End Date Taking? Authorizing Provider  amLODipine (NORVASC) 5 MG tablet Take 1 tablet (5 mg total) by mouth daily. 02/25/18   Belva Crome, MD  aspirin EC 81 MG tablet Take 81 mg by mouth  daily.    [provider]  Cyanocobalamin (VITAMIN B 12 PO) Take 1,000 mcg by mouth daily.    [provider]  doxazosin (CARDURA) 2 MG tablet Take 2 mg by mouth daily.    [provider]  glipiZIDE (GLUCOTROL XL) 10 MG 24 hr tablet Take 10 mg by mouth daily. 01/03/16   [provider]  hydrochlorothiazide (HYDRODIURIL) 25 MG tablet Take 0.5 tablets (12.5 mg total) by mouth daily. 02/25/18 02/20/19  Belva Crome, MD  losartan (COZAAR) 100 MG tablet Take 100 mg by mouth daily.    [provider]  metFORMIN (GLUMETZA) 1000 MG (MOD) 24 hr tablet Take 1,000 mg by mouth daily with breakfast.    [provider]  metoprolol succinate (TOPROL-XL) 100 MG 24 hr tablet Take 150 mg by mouth daily. Take with or immediately following a meal.     [provider]    Allergies:   Shellfish allergy; Neosporin [neomycin-bacitracin zn-polymyx]; and Zinc   Social History   Socioeconomic History  . Marital status: Married    Spouse name: Not on file  . Number of children: Not on file  . Years of education: Not on file  . Highest education level: Not on file  Occupational History  . Not on file  Social Needs  . Financial resource strain: Not on file  . Food insecurity:    Worry: Not on file    Inability: Not on file  . Transportation needs:    Medical: Not on  file    Non-medical: Not on file  Tobacco Use  . Smoking status: Never Smoker  . Smokeless tobacco: Never Used  Substance and Sexual Activity  . Alcohol use: No  . Drug use: No  . Sexual activity: Not on file    Comment: married  Lifestyle  . Physical activity:    Days per week: Not on file    Minutes per session: Not on file  . Stress: Not on file  Relationships  . Social connections:    Talks on phone: Not on file    Gets together: Not on file    Attends religious service: Not on file    Active member of club or organization: Not on file    Attends meetings of clubs or  organizations: Not on file    Relationship status: Not on file  Other Topics Concern  . Not on file  Social History Narrative  . Not on file     Family History:  The patient's family history includes Healthy in his brother, brother, and sister; Other in his father and mother.   ROS:   Please see the history of present illness.    ROS All other systems reviewed and are negative.   PHYSICAL EXAM:   VS:  BP 140/72   Pulse 77   Ht 5\' 9"  (1.753 m)   Wt 203 lb 12.8 oz (92.4 kg)   SpO2 95%   BMI 30.10 kg/m    GEN: Well nourished, well developed, in no acute distress  HEENT: normal  Neck: no JVD, carotid bruits, or masses Cardiac: RRR; no murmurs, rubs, or gallops,no edema  Respiratory:  clear to auscultation bilaterally, normal work of breathing GI: soft, nontender, nondistended, + BS MS: no deformity or atrophy  Skin: warm and dry, no rash Neuro:  Alert and Oriented x 3, Strength and sensation are intact Psych: euthymic mood, full affect  Wt Readings from Last 3 Encounters:  03/07/18 203 lb 12.8 oz (92.4 kg)  02/25/18 203 lb 12.8 oz (92.4 kg)  03/24/16 200 lb (90.7 kg)      Studies/Labs Reviewed:   EKG:  EKG is not ordered today.   Recent Labs: 03/04/2018: BUN 21; Creatinine, Ser 1.55; Potassium 3.5; Sodium 140   Lipid Panel    Component Value Date/Time   CHOL 192 03/25/2016 1030   TRIG 186 (H) 03/25/2016 1030   HDL 32 (L) 03/25/2016 1030   CHOLHDL 6.0 03/25/2016 1030   VLDL 37 03/25/2016 1030   LDLCALC 123 (H) 03/25/2016 1030    Additional studies/ records that were reviewed today include:   Echocardiogram: 02/2016 Study Conclusions  - Left ventricle: The cavity size was mildly dilated. Wall   thickness was increased in a pattern of severe LVH. Systolic   function was normal. The estimated ejection fraction was in the   range of 60% to 65%. Left ventricular diastolic function   parameters were normal. - Aortic valve: There was mild regurgitation. -  Mitral valve: There was mild regurgitation. - Left atrium: The atrium was moderately dilated. - Atrial septum: No defect or patent foramen ovale was identified.  Impressions:  - No cardiac source of emboli was indentified.  VAS US RENAL ARTERY DUPLEX on 02/23/2018 Right: No evidence of right renal artery stenosis. Normal size right    kidney. Left: No evidence of left renal artery stenosis. Normal size of    left kidney.  ASSESSMENT & PLAN:    1. HTN - BP is improving  but still elevated. Will increase norvasc to 7.5mg  qd. Continue HCTZ 12.5mg  Qd, Losartan 100mg  qd and Toprol XL 150mg  qd. Advised to check BP twice a day periodically and bring reading and machine to review. Denies snoring. No evidence of renal artery stenosis by doppler.   2. DM - Per PCP  3. CKD stage III - Scr worsen to 1.55 from 1.35. Follow closely. No change.   Medication Adjustments/Labs and Tests Ordered: Current medicines are reviewed at length with the patient today.  Concerns regarding medicines are outlined above.  Medication changes, Labs and Tests ordered today are listed in the Patient Instructions below. Patient Instructions  Your physician has recommended you make the following change in your medication: INCREASE AMLODIPINE TO 7.5 MG 1 1/2 TAB EVERY DAY    Your physician recommends that you schedule a follow-up appointment in:  AS Michael Pounds, PA  03/07/2018 2:41 PM    [address], [place], [place]  [address] Phone: [phone]; Fax: [phone]

## 2018-03-07 NOTE — Patient Instructions (Signed)
Your physician has recommended you make the following change in your medication: INCREASE AMLODIPINE TO 7.5 MG 1 1/2 TAB EVERY DAY    Your physician recommends that you schedule a follow-up appointment in:  AS SCHEDULED

## 2018-03-28 ENCOUNTER — Encounter: Payer: Self-pay | Admitting: Interventional Cardiology

## 2018-04-06 ENCOUNTER — Ambulatory Visit: Payer: Medicare Other | Admitting: Interventional Cardiology

## 2018-04-06 ENCOUNTER — Encounter: Payer: Self-pay | Admitting: Interventional Cardiology

## 2018-04-06 VITALS — BP 156/90 | HR 77 | Ht 69.0 in | Wt 205.4 lb

## 2018-04-06 DIAGNOSIS — I10 Essential (primary) hypertension: Secondary | ICD-10-CM | POA: Diagnosis not present

## 2018-04-06 DIAGNOSIS — R9431 Abnormal electrocardiogram [ECG] [EKG]: Secondary | ICD-10-CM | POA: Diagnosis not present

## 2018-04-06 DIAGNOSIS — R0989 Other specified symptoms and signs involving the circulatory and respiratory systems: Secondary | ICD-10-CM | POA: Diagnosis not present

## 2018-04-06 DIAGNOSIS — E119 Type 2 diabetes mellitus without complications: Secondary | ICD-10-CM | POA: Diagnosis not present

## 2018-04-06 NOTE — Progress Notes (Signed)
Cardiology Office Note    Date:  04/06/2018   ID:  Michael Hudson, DOB 1941/03/22, MRN [id number]  PCP:  Charleston Poot, MD  Cardiologist: Sinclair Grooms, MD   Chief Complaint  Patient presents with  . Hypertension    History of Present Illness:  Michael Hudson is a 77 y.o. male  with hx of CVA, DM, HTN, HLD, CKD stage III here for blood pressure followed up.   Back today for blood pressure check.  No complaints.  Compliant with his medications according to his wife although she does not ensure that he is taking it.  No spontaneous conversation.   Past Medical History:  Diagnosis Date  . Abnormal EKG 03/25/2016  . Benign tumor of skin   . CVA (cerebral infarction) 03/24/2016  . Diabetes mellitus without complication ([place])   . Enlarged prostate   . Hypercholesterolemia   . Hyperlipidemia   . Hypertension   . Hypertrophy of prostate    with urinary obstruction and other lower urinary tract  . Hypokalemia 03/25/2016  . Left-sided weakness 03/24/2016  . LVH (left ventricular hypertrophy)   . Primary osteoarthritis involving multiple joints   . TIA (transient ischemic attack) 03/24/2016  . Type 2 diabetes mellitus without complication, without long-term current use of insulin ([place])     History reviewed. No pertinent surgical history.  Current Medications: Outpatient Medications Prior to Visit  Medication Sig Dispense Refill  . amLODipine (NORVASC) 5 MG tablet Take 1.5 tablets (7.5 mg total) by mouth daily. 135 tablet 3  . aspirin EC 81 MG tablet Take 81 mg by mouth daily.    . Cyanocobalamin (VITAMIN B 12 PO) Take 1,000 mcg by mouth daily.    Marland Kitchen glipiZIDE (GLUCOTROL XL) 10 MG 24 hr tablet Take 10 mg by mouth daily.    . hydrochlorothiazide (HYDRODIURIL) 25 MG tablet Take 0.5 tablets (12.5 mg total) by mouth daily. 45 tablet 3  . losartan (COZAAR) 100 MG tablet Take 100 mg by mouth daily.    . metFORMIN (GLUMETZA) 1000 MG (MOD) 24 hr tablet Take 1,000 mg by mouth  daily with breakfast.    . metoprolol succinate (TOPROL-XL) 100 MG 24 hr tablet Take 150 mg by mouth daily. Take with or immediately following a meal.      No facility-administered medications prior to visit.      Allergies:   Shellfish allergy; Neosporin [neomycin-bacitracin zn-polymyx]; and Zinc   Social History   Socioeconomic History  . Marital status: Married    Spouse name: Not on file  . Number of children: Not on file  . Years of education: Not on file  . Highest education level: Not on file  Occupational History  . Not on file  Social Needs  . Financial resource strain: Not on file  . Food insecurity:    Worry: Not on file    Inability: Not on file  . Transportation needs:    Medical: Not on file    Non-medical: Not on file  Tobacco Use  . Smoking status: Never Smoker  . Smokeless tobacco: Never Used  Substance and Sexual Activity  . Alcohol use: No  . Drug use: No  . Sexual activity: Not on file    Comment: married  Lifestyle  . Physical activity:    Days per week: Not on file    Minutes per session: Not on file  . Stress: Not on file  Relationships  . Social connections:  Talks on phone: Not on file    Gets together: Not on file    Attends religious service: Not on file    Active member of club or organization: Not on file    Attends meetings of clubs or organizations: Not on file    Relationship status: Not on file  Other Topics Concern  . Not on file  Social History Narrative  . Not on file     Family History:  The patient's family history includes Healthy in his brother, brother, and sister; Other in his father and mother.   ROS:   Please see the history of present illness.    On. All other systems reviewed and are negative.   PHYSICAL EXAM:   VS:  BP (!) 156/90   Pulse 77   Ht 5\' 9"  (1.753 m)   Wt 205 lb 6.4 oz (93.2 kg)   BMI 30.33 kg/m     Repeat recordings 130/78 mmHg with a large cuff and 130/82 mmHg with the smaller  cuff. GEN: Well nourished, well developed, in no acute distress  HEENT: normal  Neck: no JVD, carotid bruits, or masses Cardiac: RRR; no murmurs, rubs, or gallops,no edema  Respiratory:  clear to auscultation bilaterally, normal work of breathing GI: soft, nontender, nondistended, + BS MS: no deformity or atrophy  Skin: warm and dry, no rash Neuro:  Alert and Oriented x 3, Strength and sensation are intact Psych: euthymic mood, full affect  Wt Readings from Last 3 Encounters:  04/06/18 205 lb 6.4 oz (93.2 kg)  03/07/18 203 lb 12.8 oz (92.4 kg)  02/25/18 203 lb 12.8 oz (92.4 kg)      Studies/Labs Reviewed:   EKG:  EKG  none  Recent Labs: 03/04/2018: BUN 21; Creatinine, Ser 1.55; Potassium 3.5; Sodium 140   Lipid Panel    Component Value Date/Time   CHOL 192 03/25/2016 1030   TRIG 186 (H) 03/25/2016 1030   HDL 32 (L) 03/25/2016 1030   CHOLHDL 6.0 03/25/2016 1030   VLDL 37 03/25/2016 1030   LDLCALC 123 (H) 03/25/2016 1030    Additional studies/ records that were reviewed today include:  none    ASSESSMENT:    1. Essential hypertension   2. Suspected cerebrovascular accident (CVA)   3. Type 2 diabetes mellitus without complication, without long-term current use of insulin ([place])   4. Abnormal EKG      PLAN:  In order of problems listed above:  1. Better blood pressure.  Target is 130/80 mmHg.  Initial pressures were in the 150s.  We discussed aerobic activity, weight loss, and low-salt diet.  Further intensification of anti-hypertensive regimen could be in the form of Aldactone 12.5 mg/day added to the current regimen but would need to pay close attention to kidney function and potassium levels.  Amlodipine could also be further increased to 10 mg/day. 2. Not discussed 3. Target A1c less than 7 4. Abnormal EKG related to severe left ventricular hypertrophy felt to be secondary to poorly controlled blood pressure.  TTR amyloidosis would also be a consideration.  Good  blood pressure control we should repeat echocardiography in 6 to 12 months to see if LV function and size are improved.  PRN follow-up with cardiology earlier if symptoms.  1 year OV  Medication Adjustments/Labs and Tests Ordered: Current medicines are reviewed at length with the patient today.  Concerns regarding medicines are outlined above.  Medication changes, Labs and Tests ordered today are listed in the Patient Instructions  below. Patient Instructions  Medication Instructions:  Your physician recommends that you continue on your current medications as directed. Please refer to the Current Medication list given to you today.  Labwork: None  Testing/Procedures: None  Follow-Up: Your physician wants you to follow-up in: 1 year with Dr. Tamala Julian.  You will receive a reminder letter in the mail two months in advance. If you don't receive a letter, please call our office to schedule the follow-up appointment.   Any Other Special Instructions Will Be Listed Below (If Applicable).     If you need a refill on your cardiac medications before your next appointment, please call your pharmacy.      Signed, Sinclair Grooms, MD  04/06/2018 3:54 PM    [address], [address], [place]  [address] Phone: [phone]; Fax: [phone]

## 2018-04-06 NOTE — Patient Instructions (Signed)

## 2018-04-19 ENCOUNTER — Ambulatory Visit: Payer: Medicare Other | Admitting: Interventional Cardiology

## 2018-04-27 ENCOUNTER — Ambulatory Visit: Payer: Medicare Other | Admitting: Interventional Cardiology

## 2018-04-27 ENCOUNTER — Encounter

## 2018-07-28 LAB — BASIC METABOLIC PANEL
BUN: 21 (ref 4–21)
CO2: 30 — AB (ref 13–22)
Chloride: 99 (ref 99–108)
Creatinine: 1.9 — AB (ref 0.6–1.3)
Glucose: 145
Potassium: 3 mEq/L — AB (ref 3.5–5.1)
Sodium: 140 (ref 137–147)

## 2018-07-28 LAB — HEMOGLOBIN A1C
Hemoglobin A1C: 7.4
Hemoglobin A1C: 7.4

## 2018-07-28 LAB — COMPREHENSIVE METABOLIC PANEL
Calcium: 9.4 (ref 8.7–10.7)
Calcium: 9.4 (ref 8.7–10.7)

## 2018-07-29 LAB — BASIC METABOLIC PANEL
BUN: 21 (ref 4–21)
CO2: 30 — AB (ref 13–22)
Chloride: 99 (ref 99–108)
Creatinine: 1.9 — AB (ref 0.6–1.3)
Glucose: 145
Potassium: 3 mEq/L — AB (ref 3.5–5.1)
Sodium: 140 (ref 137–147)

## 2018-08-30 LAB — BASIC METABOLIC PANEL
BUN: 18 (ref 4–21)
CO2: 32 — AB (ref 13–22)
Chloride: 98 — AB (ref 99–108)
Creatinine: 1.7 — AB (ref 0.6–1.3)
Glucose: 124
Potassium: 3.2 mEq/L — AB (ref 3.5–5.1)
Sodium: 140 (ref 137–147)

## 2018-08-30 LAB — COMPREHENSIVE METABOLIC PANEL: Calcium: 9.5 (ref 8.7–10.7)

## 2018-11-17 LAB — BASIC METABOLIC PANEL
BUN: 20 (ref 4–21)
CO2: 31 — AB (ref 13–22)
Chloride: 98 — AB (ref 99–108)
Creatinine: 2 — AB (ref 0.6–1.3)
Glucose: 159
Potassium: 3.5 mEq/L (ref 3.5–5.1)
Sodium: 140 (ref 137–147)

## 2018-11-17 LAB — HEMOGLOBIN A1C: Hemoglobin A1C: 7.4

## 2018-11-17 LAB — COMPREHENSIVE METABOLIC PANEL: Calcium: 9.8 (ref 8.7–10.7)

## 2018-12-13 ENCOUNTER — Other Ambulatory Visit: Payer: Self-pay | Admitting: Interventional Cardiology

## 2019-02-03 ENCOUNTER — Other Ambulatory Visit: Payer: Self-pay | Admitting: Interventional Cardiology

## 2019-02-06 ENCOUNTER — Other Ambulatory Visit: Payer: Self-pay | Admitting: Interventional Cardiology

## 2019-02-06 MED ORDER — HYDROCHLOROTHIAZIDE 25 MG PO TABS: 12.5000 mg | ORAL_TABLET | Freq: Every day | ORAL | 0 refills | Status: DC

## 2019-02-06 NOTE — Telephone Encounter (Signed)
Pt's medication was sent to pt's pharmacy as requested. Confirmation received.  °

## 2019-03-10 ENCOUNTER — Other Ambulatory Visit: Payer: Self-pay | Admitting: Interventional Cardiology

## 2019-03-11 LAB — BASIC METABOLIC PANEL
BUN: 18 (ref 4–21)
CO2: 29 — AB (ref 13–22)
Chloride: 99 (ref 99–108)
Creatinine: 1.7 — AB (ref 0.6–1.3)
Glucose: 126
Potassium: 3.1 mEq/L — AB (ref 3.5–5.1)
Sodium: 141 (ref 137–147)

## 2019-03-11 LAB — LIPID PANEL
Cholesterol: 211 — AB (ref 0–200)
HDL: 33 — AB (ref 35–70)
LDL Cholesterol: 148
LDl/HDL Ratio: 6.4
Triglycerides: 167 — AB (ref 40–160)

## 2019-03-11 LAB — HEPATIC FUNCTION PANEL
ALT: 11 U/L (ref 10–40)
AST: 15 (ref 14–40)
Alkaline Phosphatase: 53 (ref 25–125)
Bilirubin, Total: 0.4

## 2019-03-11 LAB — COMPREHENSIVE METABOLIC PANEL
Albumin: 4.4 (ref 3.5–5.0)
Calcium: 9.3 (ref 8.7–10.7)
Globulin: 3

## 2019-03-11 LAB — CBC AND DIFFERENTIAL
HCT: 35 — AB (ref 41–53)
Hemoglobin: 11.8 — AB (ref 13.5–17.5)
Platelets: 288 10*3/uL (ref 150–400)
WBC: 6.7

## 2019-03-11 LAB — PSA: PSA: 0.8

## 2019-03-11 LAB — HEMOGLOBIN A1C: Hemoglobin A1C: 7.4

## 2019-03-11 LAB — CBC: RBC: 4.21 (ref 3.87–5.11)

## 2019-04-01 ENCOUNTER — Other Ambulatory Visit: Payer: Self-pay | Admitting: Interventional Cardiology

## 2019-04-12 ENCOUNTER — Other Ambulatory Visit: Payer: Self-pay | Admitting: Physician Assistant

## 2019-05-04 ENCOUNTER — Other Ambulatory Visit: Payer: Self-pay | Admitting: Interventional Cardiology

## 2019-05-26 ENCOUNTER — Encounter: Payer: Self-pay | Admitting: Interventional Cardiology

## 2019-05-26 ENCOUNTER — Ambulatory Visit (INDEPENDENT_AMBULATORY_CARE_PROVIDER_SITE_OTHER): Payer: Medicare Other | Admitting: Interventional Cardiology

## 2019-05-26 ENCOUNTER — Other Ambulatory Visit: Payer: Self-pay

## 2019-05-26 VITALS — BP 164/92 | HR 71 | Ht 69.0 in | Wt 208.0 lb

## 2019-05-26 DIAGNOSIS — E119 Type 2 diabetes mellitus without complications: Secondary | ICD-10-CM | POA: Diagnosis not present

## 2019-05-26 DIAGNOSIS — Z7189 Other specified counseling: Secondary | ICD-10-CM

## 2019-05-26 DIAGNOSIS — I5032 Chronic diastolic (congestive) heart failure: Secondary | ICD-10-CM

## 2019-05-26 DIAGNOSIS — I110 Hypertensive heart disease with heart failure: Secondary | ICD-10-CM | POA: Diagnosis not present

## 2019-05-26 DIAGNOSIS — R0989 Other specified symptoms and signs involving the circulatory and respiratory systems: Secondary | ICD-10-CM

## 2019-05-26 DIAGNOSIS — I10 Essential (primary) hypertension: Secondary | ICD-10-CM | POA: Diagnosis not present

## 2019-05-26 DIAGNOSIS — E782 Mixed hyperlipidemia: Secondary | ICD-10-CM

## 2019-05-26 DIAGNOSIS — R9431 Abnormal electrocardiogram [ECG] [EKG]: Secondary | ICD-10-CM | POA: Diagnosis not present

## 2019-05-26 NOTE — Patient Instructions (Addendum)
Medication Instructions:  Your physician recommends that you continue on your current medications as directed. Please refer to the Current Medication list given to you today.  If you need a refill on your cardiac medications before your next appointment, please call your pharmacy.   Lab work: None If you have labs (blood work) drawn today and your tests are completely normal, you will receive your results only by: Marland Kitchen MyChart Message (if you have MyChart) OR . A paper copy in the mail If you have any lab test that is abnormal or we need to change your treatment, we will call you to review the results.  Testing/Procedures: None  Follow-Up: At [facility], you and your health needs are our priority.  As part of our continuing mission to provide you with exceptional heart care, we have created designated Provider Care Teams.  These Care Teams include your primary Cardiologist (physician) and Advanced Practice Providers (APPs -  Physician Assistants and Nurse Practitioners) who all work together to provide you with the care you need, when you need it. You will need a follow up appointment in 4-6 weeks.  Please call our office 2 months in advance to schedule this appointment.  You may see Sinclair Grooms, MD or one of the following Advanced Practice Providers on your designated Care Team:   Truitt Merle, NP Cecilie Kicks, NP . Kathyrn Drown, NP  Any Other Special Instructions Will Be Listed Below (If Applicable).  Please contact the office via telephone or MyChart with a week's worth of blood pressure readings so we can properly treat your blood pressure.

## 2019-05-26 NOTE — Progress Notes (Signed)
Cardiology Office Note:    Date:  05/26/2019   ID:  Michael Hudson, DOB [birth year], MRN [id number]  PCP:  Charleston Poot, MD  Cardiologist:  Sinclair Grooms, MD   Referring MD: Charleston Poot, MD   Chief Complaint  Patient presents with  . Hypertension    Hypertensive heart disease    History of Present Illness:    Michael Hudson is a 78 y.o. male with a hx of CVA, DM, HTN, HLD, CKD stage III here for blood pressure followed up.   Relatively inactive.  No cardiac complaints.  Compliant with medical regimen.  Does not have a record of his blood pressure recordings at home.  Initial blood pressure here was significantly elevated at 160/90.  Repeat when I was in the room was 148/78.  He denies lower extremity edema, orthopnea, PND, and neurological symptoms.  Past Medical History:  Diagnosis Date  . Abnormal EKG 03/25/2016  . Benign tumor of skin   . CVA (cerebral infarction) 03/24/2016  . Diabetes mellitus without complication ([place])   . Enlarged prostate   . Hypercholesterolemia   . Hyperlipidemia   . Hypertension   . Hypertrophy of prostate    with urinary obstruction and other lower urinary tract  . Hypokalemia 03/25/2016  . Left-sided weakness 03/24/2016  . LVH (left ventricular hypertrophy)   . Primary osteoarthritis involving multiple joints   . TIA (transient ischemic attack) 03/24/2016  . Type 2 diabetes mellitus without complication, without long-term current use of insulin ([place])     History reviewed. No pertinent surgical history.  Current Medications: Current Meds  Medication Sig  . amLODipine (NORVASC) 5 MG tablet Take 1.5 tablets (7.5 mg total) by mouth daily. Please keep upcoming appt in August for future refills. Thank you  . aspirin EC 81 MG tablet Take 81 mg by mouth daily.  . Cyanocobalamin (VITAMIN B 12 PO) Take 1,000 mcg by mouth daily.  Marland Kitchen glipiZIDE (GLUCOTROL XL) 10 MG 24 hr tablet Take 10 mg by mouth daily.  . hydrochlorothiazide (HYDRODIURIL)  25 MG tablet Take 0.5 tablets (12.5 mg total) by mouth daily. Pt must keep appt in Aug to get further refills  . losartan (COZAAR) 100 MG tablet Take 100 mg by mouth daily.  . metFORMIN (GLUMETZA) 1000 MG (MOD) 24 hr tablet Take 1,000 mg by mouth daily with breakfast.  . metoprolol succinate (TOPROL-XL) 100 MG 24 hr tablet Take 150 mg by mouth daily. Take with or immediately following a meal.      Allergies:   Shellfish allergy, Neosporin [neomycin-bacitracin zn-polymyx], and Zinc   Social History   Socioeconomic History  . Marital status: Married    Spouse name: Not on file  . Number of children: Not on file  . Years of education: Not on file  . Highest education level: Not on file  Occupational History  . Not on file  Social Needs  . Financial resource strain: Not on file  . Food insecurity    Worry: Not on file    Inability: Not on file  . Transportation needs    Medical: Not on file    Non-medical: Not on file  Tobacco Use  . Smoking status: Never Smoker  . Smokeless tobacco: Never Used  Substance and Sexual Activity  . Alcohol use: No  . Drug use: No  . Sexual activity: Not on file    Comment: married  Lifestyle  . Physical activity    Days per week: Not  on file    Minutes per session: Not on file  . Stress: Not on file  Relationships  . Social Herbalist on phone: Not on file    Gets together: Not on file    Attends religious service: Not on file    Active member of club or organization: Not on file    Attends meetings of clubs or organizations: Not on file    Relationship status: Not on file  Other Topics Concern  . Not on file  Social History Narrative  . Not on file     Family History: The patient's family history includes Healthy in his brother, brother, and sister; Other in his father and mother.  ROS:   Please see the history of present illness.    Dr. Daryel Hudson has done recent labs and Peed would prefer that we check those before drawing  new blood work today.  All other systems reviewed and are negative.  EKGs/Labs/Other Studies Reviewed:    The following studies were reviewed today: No new data  EKG:  EKG sinus rhythm with prominent voltage and marked precordial T wave abnormality unchanged from prior tracing most recently performed in May 2019.   Recent Labs: No results found for requested labs within last 8760 hours.  Recent Lipid Panel    Component Value Date/Time   CHOL 192 03/25/2016 1030   TRIG 186 (H) 03/25/2016 1030   HDL 32 (L) 03/25/2016 1030   CHOLHDL 6.0 03/25/2016 1030   VLDL 37 03/25/2016 1030   LDLCALC 123 (H) 03/25/2016 1030    Physical Exam:    VS:  BP (!) 164/92   Pulse 71   Ht 5\' 9"  (1.753 m)   Wt 208 lb (94.3 kg)   SpO2 97%   BMI 30.72 kg/m     Wt Readings from Last 3 Encounters:  05/26/19 208 lb (94.3 kg)  04/06/18 205 lb 6.4 oz (93.2 kg)  03/07/18 203 lb 12.8 oz (92.4 kg)     GEN: Moderate obesity. No acute distress HEENT: Normal NECK: No JVD. LYMPHATICS: No lymphadenopathy CARDIAC:  RRR without murmur, gallop, or edema. VASCULAR:  Normal Pulses. No bruits. RESPIRATORY:  Clear to auscultation without rales, wheezing or rhonchi  ABDOMEN: Soft, non-tender, non-distended, No pulsatile mass, MUSCULOSKELETAL: No deformity  SKIN: Warm and dry NEUROLOGIC:  Alert and oriented x 3 PSYCHIATRIC:  Normal affect   ASSESSMENT:    1. Essential hypertension   2. Suspected cerebrovascular accident (CVA)   3. Type 2 diabetes mellitus without complication, without long-term current use of insulin (Michael Hudson)   4. Abnormal EKG   5. Hypertensive heart disease with chronic diastolic congestive heart failure ([place])   6. Educated About Covid-19 Virus Infection   7. Mixed hyperlipidemia    PLAN:    In order of problems listed above:  1. We will check most recent labs from the doctor in [facility].  If creatinine allows we will add Aldactone 12.5 mg/day to get better blood pressure control.  If  Aldactone is added we will repeat a blood test after starting the Aldactone.  We will get additional blood pressure recordings from Dr. Sherlyn Lees his wife.  If pressures are significantly  2. No new neurological complaints 3. Not addressed although A1c target is less than 7 4. ECG with the appearance of LVH with strain and marked precordial and inferolateral T wave inversion. 5. No gallop or suggestion of heart failure.  No clinical evidence of volume overload  6. Facemask, washing, and social distancing is recommended  After we see the laboratory data from Dr. Daryel Hudson, will decide about adding Spironolactone 12.5 mg/day.  The main issue is the level of creatinine and potassium.  He feels that recent laboratory data was done at [facility].  We will attempt to obtain these before making any changes.  We will also review blood pressure recordings from his wife.  Target blood pressure now should be 130/80 mmHg.  1 month follow-up.   Medication Adjustments/Labs and Tests Ordered: Current medicines are reviewed at length with the patient today.  Concerns regarding medicines are outlined above.  Orders Placed This Encounter  Procedures  . EKG 12-Lead   No orders of the defined types were placed in this encounter.   Patient Instructions  Medication Instructions:  Your physician recommends that you continue on your current medications as directed. Please refer to the Current Medication list given to you today.  If you need a refill on your cardiac medications before your next appointment, please call your pharmacy.   Lab work: None If you have labs (blood work) drawn today and your tests are completely normal, you will receive your results only by: Marland Kitchen MyChart Message (if you have MyChart) OR . A paper copy in the mail If you have any lab test that is abnormal or we need to change your treatment, we will call you to review the results.  Testing/Procedures: None  Follow-Up: At [facility], you and your health needs are our priority.  As part of our continuing mission to provide you with exceptional heart care, we have created designated Provider Care Teams.  These Care Teams include your primary Cardiologist (physician) and Advanced Practice Providers (APPs -  Physician Assistants and Nurse Practitioners) who all work together to provide you with the care you need, when you need it. You will need a follow up appointment in 4-6 weeks.  Please call our office 2 months in advance to schedule this appointment.  You may see Sinclair Grooms, MD or one of the following Advanced Practice Providers on your designated Care Team:   Truitt Merle, NP Cecilie Kicks, NP . Kathyrn Drown, NP  Any Other Special Instructions Will Be Listed Below (If Applicable).  Please contact the office via telephone or MyChart with a week's worth of blood pressure readings so we can properly treat your blood pressure.       Signed, Sinclair Grooms, MD  05/26/2019 5:14 PM    [facility]

## 2019-06-02 ENCOUNTER — Telehealth: Payer: Self-pay

## 2019-06-02 DIAGNOSIS — E785 Hyperlipidemia, unspecified: Secondary | ICD-10-CM

## 2019-06-02 DIAGNOSIS — Z79899 Other long term (current) drug therapy: Secondary | ICD-10-CM

## 2019-06-02 MED ORDER — ROSUVASTATIN CALCIUM 20 MG PO TABS: 20.0000 mg | ORAL_TABLET | Freq: Every day | ORAL | 3 refills | Status: DC

## 2019-06-02 MED ORDER — FUROSEMIDE 40 MG PO TABS: 40.0000 mg | ORAL_TABLET | Freq: Every day | ORAL | 3 refills | Status: DC

## 2019-06-02 MED ORDER — POTASSIUM CHLORIDE ER 10 MEQ PO TBCR: 10.0000 meq | EXTENDED_RELEASE_TABLET | Freq: Every day | ORAL | 3 refills | Status: DC

## 2019-06-02 NOTE — Telephone Encounter (Signed)
Notes recorded by Frederik Schmidt, RN on 06/02/2019 at 9:24 AM EDT  The patient's wife has been notified of the result and verbalized understanding. All questions (if any) were answered.  Frederik Schmidt, RN 06/02/2019 9:24 AM

## 2019-06-08 ENCOUNTER — Other Ambulatory Visit: Payer: Self-pay | Admitting: Interventional Cardiology

## 2019-06-08 NOTE — Telephone Encounter (Signed)
HCTZ was stopped and Furosemide was started.  Pt should not be taking this anymore.  See scanned labs from June.

## 2019-06-08 NOTE — Telephone Encounter (Signed)
This refill came through and I saw that you stopped this medication on 06/02/19. Didn't see why it was stopped.

## 2019-06-09 ENCOUNTER — Other Ambulatory Visit: Payer: Self-pay

## 2019-06-09 ENCOUNTER — Other Ambulatory Visit: Payer: Medicare Other | Admitting: *Deleted

## 2019-06-09 DIAGNOSIS — Z79899 Other long term (current) drug therapy: Secondary | ICD-10-CM

## 2019-06-09 DIAGNOSIS — E785 Hyperlipidemia, unspecified: Secondary | ICD-10-CM

## 2019-06-09 LAB — COMPREHENSIVE METABOLIC PANEL
ALT: 14 IU/L (ref 0–44)
AST: 17 IU/L (ref 0–40)
Albumin/Globulin Ratio: 1.4 (ref 1.2–2.2)
Albumin: 4.3 g/dL (ref 3.7–4.7)
Alkaline Phosphatase: 63 IU/L (ref 39–117)
BUN/Creatinine Ratio: 10 (ref 10–24)
BUN: 18 mg/dL (ref 8–27)
Bilirubin Total: <0.2 mg/dL (ref 0.0–1.2)
CO2: 24 mmol/L (ref 20–29)
Calcium: 8.8 mg/dL (ref 8.6–10.2)
Chloride: 99 mmol/L (ref 96–106)
Creatinine, Ser: 1.81 mg/dL — ABNORMAL HIGH (ref 0.76–1.27)
GFR calc Af Amer: 41 mL/min/{1.73_m2} — ABNORMAL LOW (ref >59)
GFR calc non Af Amer: 35 mL/min/{1.73_m2} — ABNORMAL LOW (ref >59)
Globulin, Total: 3 g/dL (ref 1.5–4.5)
Glucose: 127 mg/dL — ABNORMAL HIGH (ref 65–99)
Potassium: 3.3 mmol/L — ABNORMAL LOW (ref 3.5–5.2)
Sodium: 139 mmol/L (ref 134–144)
Total Protein: 7.3 g/dL (ref 6.0–8.5)

## 2019-06-09 LAB — PRO B NATRIURETIC PEPTIDE: NT-Pro BNP: 76 pg/mL (ref 0–486)

## 2019-06-12 ENCOUNTER — Telehealth: Payer: Self-pay | Admitting: Interventional Cardiology

## 2019-06-12 MED ORDER — POTASSIUM CHLORIDE ER 10 MEQ PO TBCR: 20.0000 meq | EXTENDED_RELEASE_TABLET | Freq: Every day | ORAL | 3 refills | Status: DC

## 2019-06-12 NOTE — Telephone Encounter (Signed)
Spoke with wife, DPR on file.  Went over lab results and recommendations.  Wife verbalized understanding and was appreciative for call. Wife did mention that pt is unable to swallow the large 72meq potassium pills.  Advised I will send in script for two 67meq tabs.

## 2019-06-12 NOTE — Telephone Encounter (Signed)
  Wife is returning call regarding patients lab results

## 2019-06-14 ENCOUNTER — Ambulatory Visit: Payer: Medicare Other

## 2019-06-15 ENCOUNTER — Telehealth: Payer: Self-pay | Admitting: Interventional Cardiology

## 2019-06-15 NOTE — Telephone Encounter (Signed)
Pt seen 8/28 and was told to call with BP readings in a week.  Reached out to pt since we had not received readings yet.  Spoke with wife, DPR on file.  She provided the follow readings:  9/9 @ 1257- 138/82 9/9 @ 2322- 144/80 9/10  @ 1044- 127/73 9/11 @ 0723- 163/84 9/12 @ 2324- 124/77 9/13 @ 0811- 140/80 9/13 @ 2012- 144/74 9/14 @ 2157- 156/87 9/15 @ 0730 - 129/82  Pt takes Amlodipine 7.5mg , Furosemide 40mg , Losartan 100mg  and Metoprolol Succinate 150mg  every morning around 0900.  Advised I will send readings to Dr. Tamala Julian for review and advisement.

## 2019-06-15 NOTE — Telephone Encounter (Signed)
These recordings are reasonable

## 2019-06-17 LAB — HEPATIC FUNCTION PANEL
ALT: 12 U/L (ref 10–40)
AST: 16 (ref 14–40)
Alkaline Phosphatase: 58 (ref 25–125)
Bilirubin, Total: 0.4

## 2019-06-17 LAB — BASIC METABOLIC PANEL
BUN: 15 (ref 4–21)
CO2: 26 — AB (ref 13–22)
Chloride: 100 (ref 99–108)
Creatinine: 1.8 — AB (ref 0.6–1.3)
Glucose: 140
Potassium: 3.4 mEq/L — AB (ref 3.5–5.1)
Sodium: 140 (ref 137–147)

## 2019-06-17 LAB — IRON,TIBC AND FERRITIN PANEL
Ferritin: 63
Iron: 47

## 2019-06-17 LAB — HEMOGLOBIN A1C: Hemoglobin A1C: 7.7

## 2019-06-17 LAB — COMPREHENSIVE METABOLIC PANEL
Albumin: 4.4 (ref 3.5–5.0)
Calcium: 9.2 (ref 8.7–10.7)
Globulin: 3.1

## 2019-06-17 LAB — VITAMIN B12: Vitamin B-12: 392

## 2019-06-19 NOTE — Telephone Encounter (Signed)
Left detailed msg on pts answering machine per DPR that his BP readings are reasonable per Dr. Tamala Julian and no new changes to continue to monitor and call if he has any problems.

## 2019-06-21 ENCOUNTER — Ambulatory Visit: Payer: Medicare Other

## 2019-06-21 ENCOUNTER — Telehealth: Payer: Self-pay | Admitting: Pharmacist

## 2019-06-21 NOTE — Telephone Encounter (Signed)
Spoke with patient wife about appointment with pharmD today. Patient has appointment with Dr.Smith in 2 weeks. States his blood pressure is running much better. Will cancel appointment with pharmD for today. If after appointment with Dr. Tamala Julian if patient needs follow up, we will be happy to see patient.

## 2019-06-30 ENCOUNTER — Other Ambulatory Visit: Payer: Self-pay | Admitting: Physician Assistant

## 2019-07-03 NOTE — Progress Notes (Signed)
Cardiology Office Note:    Date:  07/04/2019   ID:  Michael Hudson, DOB 06-22-1941, MRN [id number]  PCP:  Charleston Poot, MD  Cardiologist:  Sinclair Grooms, MD   Referring MD: Charleston Poot, MD   Chief Complaint  Patient presents with  . Hypertension    History of Present Illness:    Michael Hudson is a 78 y.o. male with a hx of  CVA, DM, HTN, HLD, CKD stage III, and chronic diastolic HF.  Michael Hudson is doing well.  He has no complaints after adjustments in his medical regimen.  Furosemide was added to help better control blood pressure.  He has not had lightheadedness or syncope.  Past Medical History:  Diagnosis Date  . Abnormal EKG 03/25/2016  . Benign tumor of skin   . CVA (cerebral infarction) 03/24/2016  . Diabetes mellitus without complication ([place])   . Enlarged prostate   . Hypercholesterolemia   . Hyperlipidemia   . Hypertension   . Hypertrophy of prostate    with urinary obstruction and other lower urinary tract  . Hypokalemia 03/25/2016  . Left-sided weakness 03/24/2016  . LVH (left ventricular hypertrophy)   . Primary osteoarthritis involving multiple joints   . TIA (transient ischemic attack) 03/24/2016  . Type 2 diabetes mellitus without complication, without long-term current use of insulin ([place])     History reviewed. No pertinent surgical history.  Current Medications: Current Meds  Medication Sig  . amLODipine (NORVASC) 5 MG tablet TAKE 1.5 TABLETS (7.5 MG TOTAL) BY MOUTH DAILY. PLEASE KEEP UPCOMING APPT IN AUGUST FOR FUTURE REFILLS. THANK YOU  . aspirin EC 81 MG tablet Take 81 mg by mouth daily.  . Cyanocobalamin (VITAMIN B 12 PO) Take 1,000 mcg by mouth daily.  . furosemide (LASIX) 40 MG tablet Take 1 tablet (40 mg total) by mouth daily.  Marland Kitchen glipiZIDE (GLUCOTROL XL) 10 MG 24 hr tablet Take 10 mg by mouth 2 (two) times daily.   Marland Kitchen losartan (COZAAR) 100 MG tablet Take 100 mg by mouth daily.  . metFORMIN (GLUMETZA) 1000 MG (MOD) 24 hr tablet Take  1,000 mg by mouth 2 (two) times daily with a meal.   . metoprolol succinate (TOPROL-XL) 100 MG 24 hr tablet Take 150 mg by mouth daily. Take with or immediately following a meal.   . potassium chloride (K-DUR) 10 MEQ tablet Take 2 tablets (20 mEq total) by mouth daily.  . rosuvastatin (CRESTOR) 20 MG tablet Take 1 tablet (20 mg total) by mouth daily.     Allergies:   Shellfish allergy, Neosporin [neomycin-bacitracin zn-polymyx], and Zinc   Social History   Socioeconomic History  . Marital status: Married    Spouse name: Not on file  . Number of children: Not on file  . Years of education: Not on file  . Highest education level: Not on file  Occupational History  . Not on file  Social Needs  . Financial resource strain: Not on file  . Food insecurity    Worry: Not on file    Inability: Not on file  . Transportation needs    Medical: Not on file    Non-medical: Not on file  Tobacco Use  . Smoking status: Never Smoker  . Smokeless tobacco: Never Used  Substance and Sexual Activity  . Alcohol use: No  . Drug use: No  . Sexual activity: Not on file    Comment: married  Lifestyle  . Physical activity    Days per  week: Not on file    Minutes per session: Not on file  . Stress: Not on file  Relationships  . Social Herbalist on phone: Not on file    Gets together: Not on file    Attends religious service: Not on file    Active member of club or organization: Not on file    Attends meetings of clubs or organizations: Not on file    Relationship status: Not on file  Other Topics Concern  . Not on file  Social History Narrative  . Not on file     Family History: The patient's family history includes Healthy in his brother, brother, and sister; Other in his father and mother.  ROS:   Please see the history of present illness.    No complaints.  He has no recollection about anything.  Both he and his wife are very sedentary.  They both have vascular disease.   All other systems reviewed and are negative.  EKGs/Labs/Other Studies Reviewed:    The following studies were reviewed today: He will have to have blood work done today.  EKG:  EKG not repeated  Recent Labs: 06/09/2019: ALT 14; BUN 18; Creatinine, Ser 1.81; NT-Pro BNP 76; Potassium 3.3; Sodium 139  Recent Lipid Panel    Component Value Date/Time   CHOL 192 03/25/2016 1030   TRIG 186 (H) 03/25/2016 1030   HDL 32 (L) 03/25/2016 1030   CHOLHDL 6.0 03/25/2016 1030   VLDL 37 03/25/2016 1030   LDLCALC 123 (H) 03/25/2016 1030    Physical Exam:    VS:  BP (!) 142/76   Pulse 78   Ht 5\' 9"  (1.753 m)   Wt 207 lb 12.8 oz (94.3 kg)   SpO2 96%   BMI 30.69 kg/m     Wt Readings from Last 3 Encounters:  07/04/19 207 lb 12.8 oz (94.3 kg)  05/26/19 208 lb (94.3 kg)  04/06/18 205 lb 6.4 oz (93.2 kg)     GEN: Mild obesity. No acute distress HEENT: Normal NECK: No JVD. LYMPHATICS: No lymphadenopathy CARDIAC:  RRR without murmur, gallop, or edema. VASCULAR:  Normal Pulses. No bruits. RESPIRATORY:  Clear to auscultation without rales, wheezing or rhonchi  ABDOMEN: Soft, non-tender, non-distended, No pulsatile mass, MUSCULOSKELETAL: No deformity  SKIN: Warm and dry NEUROLOGIC:  Alert and oriented x 3.  No spontaneous conversation.  Unable to recite any of his medications and says he does not know how to operate his blood pressure device which simply requires placing it on his arm and pressing the start button. PSYCHIATRIC:  Normal affect   ASSESSMENT:    1. Hypertension, essential   2. Mixed hyperlipidemia   3. Type 2 diabetes mellitus without complication, without long-term current use of insulin ([place])   4. Abnormal EKG   5. TIA (transient ischemic attack)   6. Educated about COVID-19 virus infection    PLAN:    In order of problems listed above:  1. Blood pressure is better controlled today.  My repeat determination was 138/68.  On his own home blood pressure device was  128/70. 2. Most recent LDL cholesterol from 2017 was 123.  Target should be less than 70. 3. Hemoglobin A1c target is less than 7. 4. Not repeated that 5. No new neurological findings 6. Social distancing, mask wearing, and handwashing is emphasized.  Overall education and awareness concerning primary/secondary risk prevention was discussed in detail: LDL less than 70, hemoglobin A1c less than 7,  blood pressure target less than 130/80 mmHg, >150 minutes of moderate aerobic activity per week, avoidance of smoking, weight control (via diet and exercise), and continued surveillance/management of/for obstructive sleep apnea.  We will repeat a metabolic profile and lipid panel today.  Medication Adjustments/Labs and Tests Ordered: Current medicines are reviewed at length with the patient today.  Concerns regarding medicines are outlined above.  No orders of the defined types were placed in this encounter.  No orders of the defined types were placed in this encounter.   Patient Instructions  Medication Instructions:  Your physician recommends that you continue on your current medications as directed. Please refer to the Current Medication list given to you today.  If you need a refill on your cardiac medications before your next appointment, please call your pharmacy.   Lab work: BMET today  If you have labs (blood work) drawn today and your tests are completely normal, you will receive your results only by: Marland Kitchen MyChart Message (if you have MyChart) OR . A paper copy in the mail If you have any lab test that is abnormal or we need to change your treatment, we will call you to review the results.  Testing/Procedures: None  Follow-Up: At [facility], you and your health needs are our priority.  As part of our continuing mission to provide you with exceptional heart care, we have created designated Provider Care Teams.  These Care Teams include your primary Cardiologist (physician) and  Advanced Practice Providers (APPs -  Physician Assistants and Nurse Practitioners) who all work together to provide you with the care you need, when you need it. You will need a follow up appointment in 6 months.  Please call our office 2 months in advance to schedule this appointment.  You may see Sinclair Grooms, MD or one of the following Advanced Practice Providers on your designated Care Team:   Truitt Merle, NP Cecilie Kicks, NP . Kathyrn Drown, NP  Any Other Special Instructions Will Be Listed Below (If Applicable).       Signed, Sinclair Grooms, MD  07/04/2019 1:56 PM    [facility]

## 2019-07-04 ENCOUNTER — Encounter: Payer: Self-pay | Admitting: Interventional Cardiology

## 2019-07-04 ENCOUNTER — Ambulatory Visit: Payer: Medicare Other | Admitting: Interventional Cardiology

## 2019-07-04 ENCOUNTER — Other Ambulatory Visit: Payer: Self-pay

## 2019-07-04 VITALS — BP 142/76 | HR 78 | Ht 69.0 in | Wt 207.8 lb

## 2019-07-04 DIAGNOSIS — E119 Type 2 diabetes mellitus without complications: Secondary | ICD-10-CM | POA: Diagnosis not present

## 2019-07-04 DIAGNOSIS — E782 Mixed hyperlipidemia: Secondary | ICD-10-CM

## 2019-07-04 DIAGNOSIS — Z7189 Other specified counseling: Secondary | ICD-10-CM

## 2019-07-04 DIAGNOSIS — I10 Essential (primary) hypertension: Secondary | ICD-10-CM

## 2019-07-04 DIAGNOSIS — G459 Transient cerebral ischemic attack, unspecified: Secondary | ICD-10-CM

## 2019-07-04 DIAGNOSIS — R9431 Abnormal electrocardiogram [ECG] [EKG]: Secondary | ICD-10-CM

## 2019-07-04 NOTE — Patient Instructions (Signed)
Medication Instructions:  Your physician recommends that you continue on your current medications as directed. Please refer to the Current Medication list given to you today.  If you need a refill on your cardiac medications before your next appointment, please call your pharmacy.   Lab work: BMET today  If you have labs (blood work) drawn today and your tests are completely normal, you will receive your results only by: . MyChart Message (if you have MyChart) OR . A paper copy in the mail If you have any lab test that is abnormal or we need to change your treatment, we will call you to review the results.  Testing/Procedures: None  Follow-Up: At [facility], you and your health needs are our priority.  As part of our continuing mission to provide you with exceptional heart care, we have created designated Provider Care Teams.  These Care Teams include your primary Cardiologist (physician) and Advanced Practice Providers (APPs -  Physician Assistants and Nurse Practitioners) who all work together to provide you with the care you need, when you need it. You will need a follow up appointment in 6 months.  Please call our office 2 months in advance to schedule this appointment.  You may see Henry W Smith III, MD  or one of the following Advanced Practice Providers on your designated Care Team:   Lori Gerhardt, NP Laura Ingold, NP . Jill McDaniel, NP  Any Other Special Instructions Will Be Listed Below (If Applicable).    

## 2019-07-05 LAB — LIPID PANEL
Chol/HDL Ratio: 3.2 ratio (ref 0.0–5.0)
Cholesterol, Total: 98 mg/dL — ABNORMAL LOW (ref 100–199)
HDL: 31 mg/dL — ABNORMAL LOW (ref >39)
LDL Chol Calc (NIH): 44 mg/dL (ref 0–99)
Triglycerides: 127 mg/dL (ref 0–149)
VLDL Cholesterol Cal: 23 mg/dL (ref 5–40)

## 2019-07-05 LAB — BASIC METABOLIC PANEL
BUN/Creatinine Ratio: 8 — ABNORMAL LOW (ref 10–24)
BUN: 15 mg/dL (ref 8–27)
CO2: 24 mmol/L (ref 20–29)
Calcium: 9.5 mg/dL (ref 8.6–10.2)
Chloride: 100 mmol/L (ref 96–106)
Creatinine, Ser: 1.77 mg/dL — ABNORMAL HIGH (ref 0.76–1.27)
GFR calc Af Amer: 42 mL/min/{1.73_m2} — ABNORMAL LOW (ref >59)
GFR calc non Af Amer: 36 mL/min/{1.73_m2} — ABNORMAL LOW (ref >59)
Glucose: 166 mg/dL — ABNORMAL HIGH (ref 65–99)
Potassium: 4.2 mmol/L (ref 3.5–5.2)
Sodium: 139 mmol/L (ref 134–144)

## 2019-08-02 ENCOUNTER — Other Ambulatory Visit: Payer: Self-pay

## 2019-08-02 ENCOUNTER — Other Ambulatory Visit: Payer: Medicare Other | Admitting: *Deleted

## 2019-08-02 DIAGNOSIS — E785 Hyperlipidemia, unspecified: Secondary | ICD-10-CM

## 2019-08-02 DIAGNOSIS — Z79899 Other long term (current) drug therapy: Secondary | ICD-10-CM

## 2019-08-02 LAB — HEPATIC FUNCTION PANEL
ALT: 11 IU/L (ref 0–44)
AST: 16 IU/L (ref 0–40)
Albumin: 4.6 g/dL (ref 3.7–4.7)
Alkaline Phosphatase: 79 IU/L (ref 39–117)
Bilirubin Total: 0.3 mg/dL (ref 0.0–1.2)
Bilirubin, Direct: 0.08 mg/dL (ref 0.00–0.40)
Total Protein: 7.6 g/dL (ref 6.0–8.5)

## 2019-08-02 LAB — LIPID PANEL
Chol/HDL Ratio: 3.2 ratio (ref 0.0–5.0)
Cholesterol, Total: 90 mg/dL — ABNORMAL LOW (ref 100–199)
HDL: 28 mg/dL — ABNORMAL LOW (ref >39)
LDL Chol Calc (NIH): 44 mg/dL (ref 0–99)
Triglycerides: 93 mg/dL (ref 0–149)
VLDL Cholesterol Cal: 18 mg/dL (ref 5–40)

## 2019-08-04 ENCOUNTER — Telehealth: Payer: Self-pay | Admitting: *Deleted

## 2019-08-04 DIAGNOSIS — E785 Hyperlipidemia, unspecified: Secondary | ICD-10-CM

## 2019-08-04 DIAGNOSIS — E119 Type 2 diabetes mellitus without complications: Secondary | ICD-10-CM

## 2019-08-04 DIAGNOSIS — I10 Essential (primary) hypertension: Secondary | ICD-10-CM

## 2019-08-04 NOTE — Telephone Encounter (Signed)
DPR ok to s/w pt's wife who has been made aware of pt's lab results by phone. She is agreeable of plan of care for pt to have repeat FASTING labs in 1 yr 08/07/20. Pt's wife said she needs to have pt's kidney and HgbA1c checked as well. I assured her I will let Dr. Tamala Julian and his nurse Anderson Malta know of the extra lab work requested. Pt's wife thanked me for the call. Patient notified of result.  Please refer to phone note from today for complete details.   Julaine Hua, [facility] 08/04/2019 2:55 PM

## 2019-08-04 NOTE — Telephone Encounter (Signed)
Ok to add BMET and A1C?

## 2019-08-06 NOTE — Telephone Encounter (Signed)
Yes

## 2019-08-07 NOTE — Addendum Note (Signed)
Addended by: Loren Racer on: 08/07/2019 07:41 AM   Modules accepted: Orders

## 2019-09-16 LAB — BASIC METABOLIC PANEL
BUN: 15 (ref 4–21)
CO2: 31 — AB (ref 13–22)
Chloride: 101 (ref 99–108)
Creatinine: 1.6 — AB (ref 0.6–1.3)
Glucose: 140
Potassium: 3.7 mEq/L (ref 3.5–5.1)
Sodium: 139 (ref 137–147)

## 2019-09-16 LAB — HEPATIC FUNCTION PANEL
ALT: 19 U/L (ref 10–40)
AST: 18 (ref 14–40)
Alkaline Phosphatase: 61 (ref 25–125)
Bilirubin, Total: 0.4

## 2019-09-16 LAB — COMPREHENSIVE METABOLIC PANEL
Albumin: 4.5 (ref 3.5–5.0)
Calcium: 9.1 (ref 8.7–10.7)
Globulin: 3.2

## 2019-09-16 LAB — HEMOGLOBIN A1C: Hemoglobin A1C: 7.4

## 2019-12-22 LAB — COMPREHENSIVE METABOLIC PANEL: Calcium: 9.4 (ref 8.7–10.7)

## 2019-12-22 LAB — HEMOGLOBIN A1C: Hemoglobin A1C: 7.8

## 2019-12-23 LAB — BASIC METABOLIC PANEL
BUN: 14 (ref 4–21)
CO2: 29 — AB (ref 13–22)
Chloride: 102 (ref 99–108)
Creatinine: 1.6 — AB (ref 0.6–1.3)
Glucose: 142
Potassium: 3.6 mEq/L (ref 3.5–5.1)
Sodium: 141 (ref 137–147)

## 2020-01-17 ENCOUNTER — Other Ambulatory Visit: Payer: Self-pay | Admitting: Interventional Cardiology

## 2020-01-24 ENCOUNTER — Telehealth: Payer: Self-pay | Admitting: Interventional Cardiology

## 2020-01-24 NOTE — Telephone Encounter (Signed)
Left message for wife and made her aware that I have placed a note in the chart stating it is ok for her to come up with husband.

## 2020-01-24 NOTE — Telephone Encounter (Signed)
New Message  Patient's wife is calling in to get approval to accompany patient to his appointment. States that she controls the aspect of his health and lets it be known what is going on and needs to be there for his appointment so that she is in the know of any changed that need to be made. Please give patient's wife a call to confirm.

## 2020-01-28 NOTE — Progress Notes (Signed)
Cardiology Office Note:    Date:  01/29/2020   ID:  Michael Hudson, DOB [birth year], MRN [id number]  PCP:  Charleston Poot, MD  Cardiologist:  Sinclair Grooms, MD   Referring MD: Charleston Poot, MD   Chief Complaint  Patient presents with  . Hypertension  . Coronary Artery Disease  . Congestive Heart Failure    History of Present Illness:    Michael Hudson is a 79 y.o. male with a hx of CVA, DM, HTN, HLD, CKD stage III, and chronic diastolic HF.  Charge Cadogan voices no complaints.  He denies orthopnea, PND, palpitations, syncope, and peripheral edema.  He has not had chest pain that concerns him for angina.  With moderate to heavy or prolonged walking.  He will develop fatigue and dyspnea.  He has not fallen and seems to have good balance.  Past Medical History:  Diagnosis Date  . Abnormal EKG 03/25/2016  . Benign tumor of skin   . CVA (cerebral infarction) 03/24/2016  . Diabetes mellitus without complication ([place])   . Enlarged prostate   . Hypercholesterolemia   . Hyperlipidemia   . Hypertension   . Hypertrophy of prostate    with urinary obstruction and other lower urinary tract  . Hypokalemia 03/25/2016  . Left-sided weakness 03/24/2016  . LVH (left ventricular hypertrophy)   . Primary osteoarthritis involving multiple joints   . TIA (transient ischemic attack) 03/24/2016  . Type 2 diabetes mellitus without complication, without long-term current use of insulin ([place])     History reviewed. No pertinent surgical history.  Current Medications: Current Meds  Medication Sig  . amLODipine (NORVASC) 5 MG tablet TAKE 1.5 TABLETS (7.5 MG TOTAL) BY MOUTH DAILY. PLEASE KEEP UPCOMING APPT IN AUGUST FOR FUTURE REFILLS. THANK YOU  . aspirin EC 81 MG tablet Take 81 mg by mouth daily.  . furosemide (LASIX) 40 MG tablet Take 1 tablet (40 mg total) by mouth daily.  Marland Kitchen glipiZIDE (GLUCOTROL XL) 10 MG 24 hr tablet Take 10 mg by mouth 2 (two) times daily.   Marland Kitchen losartan (COZAAR) 100  MG tablet Take 100 mg by mouth daily.  . metFORMIN (GLUMETZA) 1000 MG (MOD) 24 hr tablet Take 1,000 mg by mouth 2 (two) times daily with a meal.   . metoprolol succinate (TOPROL-XL) 100 MG 24 hr tablet Take 150 mg by mouth daily. Take with or immediately following a meal.   . potassium chloride (KLOR-CON) 10 MEQ tablet TAKE 2 TABLETS BY MOUTH DAILY  . rosuvastatin (CRESTOR) 20 MG tablet Take 1 tablet (20 mg total) by mouth daily.     Allergies:   Shellfish allergy, Neosporin [neomycin-bacitracin zn-polymyx], and Zinc   Social History   Socioeconomic History  . Marital status: Married    Spouse name: Not on file  . Number of children: Not on file  . Years of education: Not on file  . Highest education level: Not on file  Occupational History  . Not on file  Tobacco Use  . Smoking status: Never Smoker  . Smokeless tobacco: Never Used  Substance and Sexual Activity  . Alcohol use: No  . Drug use: No  . Sexual activity: Not on file    Comment: married  Other Topics Concern  . Not on file  Social History Narrative  . Not on file   Social Determinants of Health   Financial Resource Strain:   . Difficulty of Paying Living Expenses:   Food Insecurity:   . Worried  About Running Out of Food in the Last Year:   . [place] in the Last Year:   Transportation Needs:   . Lack of Transportation (Medical):   Marland Kitchen Lack of Transportation (Non-Medical):   Physical Activity:   . Days of Exercise per Week:   . Minutes of Exercise per Session:   Stress:   . Feeling of Stress :   Social Connections:   . Frequency of Communication with Friends and Family:   . Frequency of Social Gatherings with Friends and Family:   . Attends Religious Services:   . Active Member of Clubs or Organizations:   . Attends Archivist Meetings:   Marland Kitchen Marital Status:      Family History: The patient's family history includes Healthy in his brother, brother, and sister; Other in his father and  mother.  ROS:   Please see the history of present illness.    His legs get stiff.  He is not following a diabetic diet.  All other systems reviewed and are negative.  EKGs/Labs/Other Studies Reviewed:    The following studies were reviewed today:  No new cardiac functional or imaging data.  EKG:  EKG normal sinus rhythm, inferior and anterolateral T wave inversion, prominent voltage.  No change compared to August 2020  Recent Labs: 06/09/2019: NT-Pro BNP 76 07/04/2019: BUN 15; Creatinine, Ser 1.77; Potassium 4.2; Sodium 139 08/02/2019: ALT 11  Recent Lipid Panel    Component Value Date/Time   CHOL 90 (L) 08/02/2019 1113   TRIG 93 08/02/2019 1113   HDL 28 (L) 08/02/2019 1113   CHOLHDL 3.2 08/02/2019 1113   CHOLHDL 6.0 03/25/2016 1030   VLDL 37 03/25/2016 1030   LDLCALC 44 08/02/2019 1113    Physical Exam:    VS:  BP 136/76   Pulse 85   Ht 5' 9.5" (1.765 m)   Wt 205 lb 3.2 oz (93.1 kg)   BMI 29.87 kg/m     Wt Readings from Last 3 Encounters:  01/29/20 205 lb 3.2 oz (93.1 kg)  07/04/19 207 lb 12.8 oz (94.3 kg)  05/26/19 208 lb (94.3 kg)     GEN: Mild obesity. No acute distress HEENT: Normal NECK: No JVD. LYMPHATICS: No lymphadenopathy CARDIAC:  RRR without murmur, gallop, or edema. VASCULAR:  Normal Pulses. No bruits. RESPIRATORY:  Clear to auscultation without rales, wheezing or rhonchi  ABDOMEN: Soft, non-tender, non-distended, No pulsatile mass, MUSCULOSKELETAL: No deformity  SKIN: Warm and dry NEUROLOGIC:  Alert and oriented x 3 PSYCHIATRIC:  Normal affect   ASSESSMENT:    1. Chronic diastolic HF (heart failure) ([place])   2. Hypertension, essential   3. Mixed hyperlipidemia   4. Abnormal EKG   5. TIA (transient ischemic attack)   6. Type 2 diabetes mellitus without complication, without long-term current use of insulin ([facility])   7. Stage 3b chronic kidney disease   8. Educated about COVID-19 virus infection    PLAN:    In order of problems listed  above:  1. No evidence of volume overload.  Continue beta-blocker and diuretic therapy. 2. Continue amlodipine, furosemide, Cozaar, and Toprol-XL.  Basic metabolic panel will be obtained today. 3. The most recent lipid panel that I have available is from November 2020 with LDL was not listed but the total cholesterol was stated to be 90.  Triglyceride was 93.  Continue current therapy with high intensity rosuvastatin. 4. EKG was discussed today LVH with strain pattern is noted.  A copy of  the tracing is given. 5. Cerebrovascular disease.  Secondary prevention discussed.  Please see below. 6. Most recent A1c was 7.8 according to Mrs. Bostic.  I reiterated the importance of 30 minutes of physical activity 5 out of 7 days/week and adhering to a low-fat diet.  Consider adding an SGLT2. 7. BMET will be obtained today to follow-up on kidney function and potassium homeostasis. 8. COVID-19 vaccine has been received.  Social distancing is being observed.  Overall education and awareness concerning secondary risk prevention was discussed in detail: LDL less than 70, hemoglobin A1c less than 7, blood pressure target less than 130/80 mmHg, >150 minutes of moderate aerobic activity per week, avoidance of smoking, weight control (via diet and exercise), and continued surveillance/management of/for obstructive sleep apnea.    Medication Adjustments/Labs and Tests Ordered: Current medicines are reviewed at length with the patient today.  Concerns regarding medicines are outlined above.  Orders Placed This Encounter  Procedures  . EKG 12-Lead   No orders of the defined types were placed in this encounter.   There are no Patient Instructions on file for this visit.   Signed, Sinclair Grooms, MD  01/29/2020 1:18 PM    [place]

## 2020-01-29 ENCOUNTER — Other Ambulatory Visit: Payer: Self-pay

## 2020-01-29 ENCOUNTER — Encounter: Payer: Self-pay | Admitting: Interventional Cardiology

## 2020-01-29 ENCOUNTER — Ambulatory Visit: Payer: Medicare PPO | Admitting: Interventional Cardiology

## 2020-01-29 VITALS — BP 136/76 | HR 85 | Ht 69.5 in | Wt 205.2 lb

## 2020-01-29 DIAGNOSIS — E119 Type 2 diabetes mellitus without complications: Secondary | ICD-10-CM

## 2020-01-29 DIAGNOSIS — I5032 Chronic diastolic (congestive) heart failure: Secondary | ICD-10-CM

## 2020-01-29 DIAGNOSIS — I10 Essential (primary) hypertension: Secondary | ICD-10-CM

## 2020-01-29 DIAGNOSIS — R9431 Abnormal electrocardiogram [ECG] [EKG]: Secondary | ICD-10-CM | POA: Diagnosis not present

## 2020-01-29 DIAGNOSIS — E782 Mixed hyperlipidemia: Secondary | ICD-10-CM | POA: Diagnosis not present

## 2020-01-29 DIAGNOSIS — N1832 Chronic kidney disease, stage 3b: Secondary | ICD-10-CM

## 2020-01-29 DIAGNOSIS — G459 Transient cerebral ischemic attack, unspecified: Secondary | ICD-10-CM

## 2020-01-29 DIAGNOSIS — Z7189 Other specified counseling: Secondary | ICD-10-CM

## 2020-01-29 NOTE — Patient Instructions (Addendum)
Medication Instructions:  Your physician recommends that you continue on your current medications as directed. Please refer to the Current Medication list given to you today.  *If you need a refill on your cardiac medications before your next appointment, please call your pharmacy*   Lab Work: BMET today  If you have labs (blood work) drawn today and your tests are completely normal, you will receive your results only by: Marland Kitchen MyChart Message (if you have MyChart) OR . A paper copy in the mail If you have any lab test that is abnormal or we need to change your treatment, we will call you to review the results.   Testing/Procedures: None   Follow-Up: At [facility], you and your health needs are our priority.  As part of our continuing mission to provide you with exceptional heart care, we have created designated Provider Care Teams.  These Care Teams include your primary Cardiologist (physician) and Advanced Practice Providers (APPs -  Physician Assistants and Nurse Practitioners) who all work together to provide you with the care you need, when you need it.  We recommend signing up for the patient portal called "MyChart".  Sign up information is provided on this After Visit Summary.  MyChart is used to connect with patients for Virtual Visits (Telemedicine).  Patients are able to view lab/test results, encounter notes, upcoming appointments, etc.  Non-urgent messages can be sent to your provider as well.   To learn more about what you can do with MyChart, go to [email or web address].    Your next appointment:   12 month(s)  The format for your next appointment:   In Person  Provider:   You may see Sinclair Grooms, MD or one of the following Advanced Practice Providers on your designated Care Team:    Truitt Merle, NP  Cecilie Kicks, NP  Kathyrn Drown, NP    Other Instructions

## 2020-01-30 LAB — BASIC METABOLIC PANEL
BUN/Creatinine Ratio: 12 (ref 10–24)
BUN: 23 mg/dL (ref 8–27)
CO2: 22 mmol/L (ref 20–29)
Calcium: 9.8 mg/dL (ref 8.6–10.2)
Chloride: 101 mmol/L (ref 96–106)
Creatinine, Ser: 1.88 mg/dL — ABNORMAL HIGH (ref 0.76–1.27)
GFR calc Af Amer: 39 mL/min/{1.73_m2} — ABNORMAL LOW (ref >59)
GFR calc non Af Amer: 33 mL/min/{1.73_m2} — ABNORMAL LOW (ref >59)
Glucose: 163 mg/dL — ABNORMAL HIGH (ref 65–99)
Potassium: 3.6 mmol/L (ref 3.5–5.2)
Sodium: 140 mmol/L (ref 134–144)

## 2020-03-16 ENCOUNTER — Other Ambulatory Visit: Payer: Self-pay

## 2020-03-16 ENCOUNTER — Emergency Department (HOSPITAL_BASED_OUTPATIENT_CLINIC_OR_DEPARTMENT_OTHER)
Admission: EM | Admit: 2020-03-16 | Discharge: 2020-03-16 | Disposition: A | Discharge status: Home / Self Care | Payer: Medicare PPO | Attending: Emergency Medicine | Admitting: Emergency Medicine

## 2020-03-16 ENCOUNTER — Encounter (HOSPITAL_BASED_OUTPATIENT_CLINIC_OR_DEPARTMENT_OTHER): Payer: Self-pay

## 2020-03-16 ENCOUNTER — Emergency Department (HOSPITAL_BASED_OUTPATIENT_CLINIC_OR_DEPARTMENT_OTHER): Payer: Medicare PPO

## 2020-03-16 DIAGNOSIS — S61411A Laceration without foreign body of right hand, initial encounter: Secondary | ICD-10-CM | POA: Diagnosis not present

## 2020-03-16 DIAGNOSIS — Y9289 Other specified places as the place of occurrence of the external cause: Secondary | ICD-10-CM | POA: Diagnosis not present

## 2020-03-16 DIAGNOSIS — Z7982 Long term (current) use of aspirin: Secondary | ICD-10-CM | POA: Diagnosis not present

## 2020-03-16 DIAGNOSIS — W208XXA Other cause of strike by thrown, projected or falling object, initial encounter: Secondary | ICD-10-CM | POA: Insufficient documentation

## 2020-03-16 DIAGNOSIS — Z7984 Long term (current) use of oral hypoglycemic drugs: Secondary | ICD-10-CM | POA: Diagnosis not present

## 2020-03-16 DIAGNOSIS — Z91013 Allergy to seafood: Secondary | ICD-10-CM | POA: Diagnosis not present

## 2020-03-16 DIAGNOSIS — E119 Type 2 diabetes mellitus without complications: Secondary | ICD-10-CM | POA: Diagnosis not present

## 2020-03-16 DIAGNOSIS — Y9389 Activity, other specified: Secondary | ICD-10-CM | POA: Insufficient documentation

## 2020-03-16 DIAGNOSIS — Z8673 Personal history of transient ischemic attack (TIA), and cerebral infarction without residual deficits: Secondary | ICD-10-CM | POA: Diagnosis not present

## 2020-03-16 DIAGNOSIS — Y998 Other external cause status: Secondary | ICD-10-CM | POA: Insufficient documentation

## 2020-03-16 DIAGNOSIS — Z888 Allergy status to other drugs, medicaments and biological substances status: Secondary | ICD-10-CM | POA: Insufficient documentation

## 2020-03-16 DIAGNOSIS — I10 Essential (primary) hypertension: Secondary | ICD-10-CM | POA: Insufficient documentation

## 2020-03-16 DIAGNOSIS — Z23 Encounter for immunization: Secondary | ICD-10-CM | POA: Insufficient documentation

## 2020-03-16 DIAGNOSIS — W19XXXA Unspecified fall, initial encounter: Secondary | ICD-10-CM

## 2020-03-16 MED ORDER — TETANUS-DIPHTH-ACELL PERTUSSIS 5-2.5-18.5 LF-MCG/0.5 IM SUSP
0.5000 mL | Freq: Once | INTRAMUSCULAR | Status: AC
  Administered 2020-03-16: 0.5 mL via INTRAMUSCULAR
  Filled 2020-03-16: qty 0.5

## 2020-03-16 MED ORDER — LIDOCAINE HCL (PF) 1 % IJ SOLN
10.0000 mL | Freq: Once | INTRAMUSCULAR | Status: AC
  Administered 2020-03-16: 10 mL
  Filled 2020-03-16: qty 10

## 2020-03-16 NOTE — ED Provider Notes (Signed)
[facility] EMERGENCY DEPARTMENT Provider Note   CSN: [id number] Arrival date & time: 03/16/20  2015     History Chief Complaint  Patient presents with  . Fall    Michael Hudson is a 79 y.o. male.  He is here for evaluation of injuries from a mechanical fall.  He said he missed stepped and fell onto the concrete landing on his outstretched right hand.  Complaining of a laceration of the base of his fifth finger.  Palmar aspect.  No numbness or weakness.  Denies any other injuries.  No loss of consciousness.  Last tetanus greater than 5 years.  Not on any blood thinners.  Did not strike his head or his neck.  The history is provided by the patient.  Fall This is a new problem. The current episode started 1 to 2 hours ago. The problem occurs rarely. The problem has not changed since onset.Pertinent negatives include no chest pain, no abdominal pain, no headaches and no shortness of breath. Nothing aggravates the symptoms. Nothing relieves the symptoms. He has tried nothing for the symptoms. The treatment provided no relief.       Past Medical History:  Diagnosis Date  . Abnormal EKG 03/25/2016  . Benign tumor of skin   . CVA (cerebral infarction) 03/24/2016  . Diabetes mellitus without complication ([place])   . Enlarged prostate   . Hypercholesterolemia   . Hyperlipidemia   . Hypertension   . Hypertrophy of prostate    with urinary obstruction and other lower urinary tract  . Hypokalemia 03/25/2016  . Left-sided weakness 03/24/2016  . LVH (left ventricular hypertrophy)   . Primary osteoarthritis involving multiple joints   . TIA (transient ischemic attack) 03/24/2016  . Type 2 diabetes mellitus without complication, without long-term current use of insulin [facility])     Patient Active Problem List   Diagnosis Date Noted  . Abnormal EKG 03/25/2016  . DM type 2, goal A1c below 7   . Left-sided weakness 03/24/2016  . CVA (cerebral infarction) 03/24/2016  . TIA (transient  ischemic attack) 03/24/2016  . Essential hypertension   . Suspected cerebrovascular accident (CVA)   . Type 2 diabetes mellitus without complication, without long-term current use of insulin (HCC)     No past surgical history on file.     Family History  Problem Relation Age of Onset  . Other Mother        natural cases  . Other Father        hx unkown  . Healthy Sister   . Healthy Brother   . Healthy Brother     Social History   Tobacco Use  . Smoking status: Never Smoker  . Smokeless tobacco: Never Used  Vaping Use  . Vaping Use: Never used  Substance Use Topics  . Alcohol use: No  . Drug use: No    Home Medications Prior to Admission medications   Medication Sig Start Date End Date Taking? Authorizing Provider  amLODipine (NORVASC) 5 MG tablet TAKE 1.5 TABLETS (7.5 MG TOTAL) BY MOUTH DAILY. PLEASE KEEP UPCOMING APPT IN AUGUST FOR FUTURE REFILLS. THANK YOU 06/30/19   Leanor Kail, PA  aspirin EC 81 MG tablet Take 81 mg by mouth daily.    [provider]  furosemide (LASIX) 40 MG tablet Take 1 tablet (40 mg total) by mouth daily. 06/02/19 01/29/20  Belva Crome, MD  glipiZIDE (GLUCOTROL XL) 10 MG 24 hr tablet Take 10 mg by mouth 2 (two)  times daily.  01/03/16   [provider]  losartan (COZAAR) 100 MG tablet Take 100 mg by mouth daily.    [provider]  metFORMIN (GLUMETZA) 1000 MG (MOD) 24 hr tablet Take 1,000 mg by mouth 2 (two) times daily with a meal.     [provider]  metoprolol succinate (TOPROL-XL) 100 MG 24 hr tablet Take 150 mg by mouth daily. Take with or immediately following a meal.     [provider]  potassium chloride (KLOR-CON) 10 MEQ tablet TAKE 2 TABLETS BY MOUTH DAILY 01/19/20   Belva Crome, MD  rosuvastatin (CRESTOR) 20 MG tablet Take 1 tablet (20 mg total) by mouth daily. 06/02/19 01/29/20  Belva Crome, MD    Allergies    Shellfish allergy, Neosporin [neomycin-bacitracin zn-polymyx], and  Zinc  Review of Systems   Review of Systems  Constitutional: Negative for fever.  HENT: Negative for sore throat.   Eyes: Negative for pain.  Respiratory: Negative for shortness of breath.   Cardiovascular: Negative for chest pain.  Gastrointestinal: Negative for abdominal pain.  Genitourinary: Negative for dysuria.  Musculoskeletal: Negative for neck pain.  Skin: Positive for wound.  Neurological: Negative for weakness, numbness and headaches.    Physical Exam Updated Vital Signs BP (!) 165/82 (BP Location: Left Arm)   Pulse 83   Temp 98.3 F (36.8 C) (Oral)   Resp 16   Ht 5' 9.5" (1.765 m)   Wt 90.7 kg   SpO2 98%   BMI 29.11 kg/m   Physical Exam Vitals and nursing note reviewed.  Constitutional:      Appearance: Normal appearance. He is well-developed.  HENT:     Head: Normocephalic and atraumatic.  Eyes:     Conjunctiva/sclera: Conjunctivae normal.  Cardiovascular:     Rate and Rhythm: Normal rate and regular rhythm.     Heart sounds: No murmur heard.   Pulmonary:     Effort: Pulmonary effort is normal. No respiratory distress.     Breath sounds: Normal breath sounds.  Abdominal:     Palpations: Abdomen is soft.     Tenderness: There is no abdominal tenderness.  Musculoskeletal:        General: Signs of injury present. Normal range of motion.     Cervical back: Neck supple.     Comments: Right hand palmar aspect approximately 2 cm laceration at the base of his fifth finger.  FDS FDP EDC intact.  Sensory to light touch intact.  Cap refill brisk.  Wrist elbow shoulder nontender.  Other digits unaffected.  Radial pulse 2+.  Skin:    General: Skin is warm and dry.     Capillary Refill: Capillary refill takes less than 2 seconds.  Neurological:     General: No focal deficit present.     Mental Status: He is alert.     Sensory: No sensory deficit.     Motor: No weakness.     ED Results / Procedures / Treatments   Labs (all labs ordered are listed, but only  abnormal results are displayed) Labs Reviewed - No data to display  EKG None  Radiology DG Hand Complete Right  Result Date: 03/16/2020 CLINICAL DATA:  79 year old male with fall and pain over the fifth MCP joint. EXAM: RIGHT HAND - COMPLETE 3+ VIEW COMPARISON:  None. FINDINGS: There is no acute fracture or dislocation. The bones are mildly osteopenic. There is degenerative changes of the interphalangeal joints. The soft tissues are grossly unremarkable.  IMPRESSION: No acute fracture or dislocation. Electronically Signed   By: Anner Crete M.D.   On: 03/16/2020 21:37    Procedures .Marland KitchenLaceration Repair  Date/Time: 03/16/2020 10:04 PM Performed by: Hayden Rasmussen, MD Authorized by: Hayden Rasmussen, MD   Consent:    Consent obtained:  Verbal   Consent given by:  Patient   Risks discussed:  Infection, pain, poor cosmetic result, poor wound healing and retained foreign body   Alternatives discussed:  No treatment and delayed treatment Anesthesia (see MAR for exact dosages):    Anesthesia method:  Local infiltration   Local anesthetic:  Lidocaine 1% w/o epi Laceration details:    Location:  Hand   Hand location:  R palm   Length (cm):  4 Repair type:    Repair type:  Simple Pre-procedure details:    Preparation:  Patient was prepped and draped in usual sterile fashion Exploration:    Wound exploration: wound explored through full range of motion     Contaminated: no   Treatment:    Area cleansed with:  Saline   Amount of cleaning:  Standard   Irrigation solution:  Sterile saline   Irrigation method:  Syringe Skin repair:    Repair method:  Sutures   Suture size:  4-0   Suture material:  Nylon   Suture technique:  Simple interrupted   Number of sutures:  5 Approximation:    Approximation:  Close Post-procedure details:    Dressing:  Antibiotic ointment and non-adherent dressing   Patient tolerance of procedure:  Tolerated well, no immediate complications    (including critical care time)  Medications Ordered in ED Medications  lidocaine (PF) (XYLOCAINE) 1 % injection 10 mL (has no administration in time range)  Tdap (BOOSTRIX) injection 0.5 mL (has no administration in time range)    ED Course  I have reviewed the triage vital signs and the nursing notes.  Pertinent labs & imaging results that were available during my care of the patient were reviewed by me and considered in my medical decision making (see chart for details).    MDM Rules/Calculators/A&P                         79 year old male here after mechanical fall and injury to right hand with laceration on the palmar aspect.  Approximately 4 cm laceration does not appear to be involving any tendon.  No foreign body identified.  X-rays ordered interpreted by me as no acute fractures or dislocations.  Wound was copiously irrigated and early closed.  We discussed close follow-up with his primary care doctor and monitoring for any signs of infection.  Return instructions discussed. Final Clinical Impression(s) / ED Diagnoses Final diagnoses:  Fall, initial encounter  Laceration of right palm, initial encounter    Rx / DC Orders ED Discharge Orders    None       Hayden Rasmussen, MD 03/17/20 1037

## 2020-03-16 NOTE — ED Triage Notes (Signed)
Pt had a mis-step and fell onto concrete with his arm stretched out. Pt denies hitting his head or LOC. Pt has a lac to the base of the R pinky finger with adipose tissue showing.

## 2020-03-16 NOTE — ED Notes (Signed)
Pt to XR

## 2020-03-16 NOTE — Discharge Instructions (Addendum)
You were seen in the emergency department for evaluation of injuries from a fall.  Your hand x-ray did not show any obvious fractures.  Your right hand laceration was sutured.  These will need to be removed in 10 to 12 days.  Use soap and water.  Tylenol as needed for pain.  Return to the emergency department sooner if any signs of infection.

## 2020-03-22 LAB — BASIC METABOLIC PANEL
BUN: 17 (ref 4–21)
CO2: 27 — AB (ref 13–22)
Chloride: 100 (ref 99–108)
Creatinine: 1.5 — AB (ref 0.6–1.3)
Glucose: 115
Potassium: 3.7 mEq/L (ref 3.5–5.1)
Sodium: 138 (ref 137–147)

## 2020-03-22 LAB — HEMOGLOBIN A1C: Hemoglobin A1C: 8

## 2020-03-22 LAB — CBC AND DIFFERENTIAL
HCT: 35 — AB (ref 41–53)
Hemoglobin: 11.8 — AB (ref 13.5–17.5)
Platelets: 286 10*3/uL (ref 150–400)
WBC: 6.6

## 2020-03-22 LAB — CBC: RBC: 4.22 (ref 3.87–5.11)

## 2020-03-22 LAB — COMPREHENSIVE METABOLIC PANEL: Calcium: 9.7 (ref 8.7–10.7)

## 2020-04-13 ENCOUNTER — Other Ambulatory Visit: Payer: Self-pay | Admitting: Interventional Cardiology

## 2020-04-28 ENCOUNTER — Other Ambulatory Visit: Payer: Self-pay | Admitting: Interventional Cardiology

## 2020-06-16 ENCOUNTER — Other Ambulatory Visit: Payer: Self-pay | Admitting: Physician Assistant

## 2020-07-03 LAB — CBC AND DIFFERENTIAL
HCT: 35 — AB (ref 41–53)
Hemoglobin: 11.7 — AB (ref 13.5–17.5)
Platelets: 288 10*3/uL (ref 150–400)
WBC: 7.4

## 2020-07-03 LAB — HEMOGLOBIN A1C: Hemoglobin A1C: 8.1

## 2020-07-03 LAB — CBC: RBC: 4.17 (ref 3.87–5.11)

## 2020-07-03 LAB — BASIC METABOLIC PANEL
BUN: 14 (ref 4–21)
CO2: 27 — AB (ref 13–22)
Chloride: 101 (ref 99–108)
Glucose: 53
Potassium: 4 mEq/L (ref 3.5–5.1)
Sodium: 140 (ref 137–147)

## 2020-07-03 LAB — COMPREHENSIVE METABOLIC PANEL: Calcium: 9.8 (ref 8.7–10.7)

## 2020-07-15 ENCOUNTER — Other Ambulatory Visit: Payer: Self-pay | Admitting: Interventional Cardiology

## 2020-07-22 ENCOUNTER — Ambulatory Visit: Payer: Medicare PPO | Attending: Internal Medicine

## 2020-07-22 ENCOUNTER — Other Ambulatory Visit (HOSPITAL_BASED_OUTPATIENT_CLINIC_OR_DEPARTMENT_OTHER): Payer: Self-pay | Admitting: Internal Medicine

## 2020-07-22 DIAGNOSIS — Z23 Encounter for immunization: Secondary | ICD-10-CM

## 2020-07-22 NOTE — Progress Notes (Signed)
   Covid-19 Vaccination Clinic  Name:  Michael Hudson    MRN: [id number] DOB: Dec 18, 1940  07/22/2020  [name] was observed post Covid-19 immunization for 15 minutes without incident. He was provided with Vaccine Information Sheet and instruction to access the V-Safe system. Vqaccinated by [facility].  [name] was instructed to call 911 with any severe reactions post vaccine: Marland Kitchen Difficulty breathing  . Swelling of face and throat  . A fast heartbeat  . A bad rash all over body  . Dizziness and weakness

## 2020-07-26 ENCOUNTER — Ambulatory Visit: Payer: Medicare PPO

## 2020-07-26 MED FILL — PFIZER-BIONTECH COVID-19 VA: 30 | 1 days supply | Qty: 0 | Fill #0

## 2020-08-07 ENCOUNTER — Other Ambulatory Visit: Payer: Medicare PPO

## 2020-08-07 ENCOUNTER — Other Ambulatory Visit: Payer: Self-pay

## 2020-08-07 DIAGNOSIS — I10 Essential (primary) hypertension: Secondary | ICD-10-CM

## 2020-08-07 DIAGNOSIS — E119 Type 2 diabetes mellitus without complications: Secondary | ICD-10-CM

## 2020-08-07 DIAGNOSIS — E785 Hyperlipidemia, unspecified: Secondary | ICD-10-CM

## 2020-08-08 LAB — HEPATIC FUNCTION PANEL
ALT: 16 IU/L (ref 0–44)
AST: 17 IU/L (ref 0–40)
Albumin: 4.6 g/dL (ref 3.7–4.7)
Alkaline Phosphatase: 78 IU/L (ref 44–121)
Bilirubin Total: 0.2 mg/dL (ref 0.0–1.2)
Bilirubin, Direct: <0.1 mg/dL (ref 0.00–0.40)
Total Protein: 7.5 g/dL (ref 6.0–8.5)

## 2020-08-08 LAB — BASIC METABOLIC PANEL
BUN/Creatinine Ratio: 7 — ABNORMAL LOW (ref 10–24)
BUN: 11 mg/dL (ref 8–27)
CO2: 26 mmol/L (ref 20–29)
Calcium: 9.2 mg/dL (ref 8.6–10.2)
Chloride: 98 mmol/L (ref 96–106)
Creatinine, Ser: 1.57 mg/dL — ABNORMAL HIGH (ref 0.76–1.27)
GFR calc Af Amer: 48 mL/min/{1.73_m2} — ABNORMAL LOW (ref >59)
GFR calc non Af Amer: 42 mL/min/{1.73_m2} — ABNORMAL LOW (ref >59)
Glucose: 223 mg/dL — ABNORMAL HIGH (ref 65–99)
Potassium: 3.6 mmol/L (ref 3.5–5.2)
Sodium: 137 mmol/L (ref 134–144)

## 2020-08-08 LAB — LIPID PANEL
Chol/HDL Ratio: 3 ratio (ref 0.0–5.0)
Cholesterol, Total: 103 mg/dL (ref 100–199)
HDL: 34 mg/dL — ABNORMAL LOW (ref >39)
LDL Chol Calc (NIH): 44 mg/dL (ref 0–99)
Triglycerides: 146 mg/dL (ref 0–149)
VLDL Cholesterol Cal: 25 mg/dL (ref 5–40)

## 2020-08-08 LAB — HEMOGLOBIN A1C
Est. average glucose Bld gHb Est-mCnc: 174 mg/dL
Hgb A1c MFr Bld: 7.7 % — ABNORMAL HIGH (ref 4.8–5.6)

## 2020-08-13 ENCOUNTER — Ambulatory Visit: Payer: Medicare PPO | Admitting: Nurse Practitioner

## 2020-08-19 ENCOUNTER — Ambulatory Visit: Payer: Medicare PPO | Admitting: Interventional Cardiology

## 2020-08-20 NOTE — Progress Notes (Deleted)
Cardiology Office Note:    Date:  08/20/2020   ID:  Michael Hudson, DOB August 14, 1941, MRN [id number]  PCP:  Charleston Poot, MD  Cardiologist:  Sinclair Grooms, MD   Referring MD: Charleston Poot, MD   No chief complaint on file.   History of Present Illness:    Michael Hudson is a 79 y.o. male with a hx of CVA, DM II, primary HTN, LVH on ECG, HLD, CKD stage III, and chronic diastolic HF.  ***  Past Medical History:  Diagnosis Date  . Abnormal EKG 03/25/2016  . Benign tumor of skin   . CVA (cerebral infarction) 03/24/2016  . Diabetes mellitus without complication ([place])   . Enlarged prostate   . Hypercholesterolemia   . Hyperlipidemia   . Hypertension   . Hypertrophy of prostate    with urinary obstruction and other lower urinary tract  . Hypokalemia 03/25/2016  . Left-sided weakness 03/24/2016  . LVH (left ventricular hypertrophy)   . Primary osteoarthritis involving multiple joints   . TIA (transient ischemic attack) 03/24/2016  . Type 2 diabetes mellitus without complication, without long-term current use of insulin (HCC)     No past surgical history on file.  Current Medications: No outpatient medications have been marked as taking for the 08/21/20 encounter (Appointment) with Belva Crome, MD.     Allergies:   Shellfish allergy, Neosporin [neomycin-bacitracin zn-polymyx], and Zinc   Social History   Socioeconomic History  . Marital status: Married    Spouse name: Not on file  . Number of children: Not on file  . Years of education: Not on file  . Highest education level: Not on file  Occupational History  . Not on file  Tobacco Use  . Smoking status: Never Smoker  . Smokeless tobacco: Never Used  Vaping Use  . Vaping Use: Never used  Substance and Sexual Activity  . Alcohol use: No  . Drug use: No  . Sexual activity: Not on file    Comment: married  Other Topics Concern  . Not on file  Social History Narrative  . Not on file   Social  Determinants of Health   Financial Resource Strain:   . Difficulty of Paying Living Expenses: Not on file  Food Insecurity:   . Worried About Charity fundraiser in the Last Year: Not on file  . Ran Out of Food in the Last Year: Not on file  Transportation Needs:   . Lack of Transportation (Medical): Not on file  . Lack of Transportation (Non-Medical): Not on file  Physical Activity:   . Days of Exercise per Week: Not on file  . Minutes of Exercise per Session: Not on file  Stress:   . Feeling of Stress : Not on file  Social Connections:   . Frequency of Communication with Friends and Family: Not on file  . Frequency of Social Gatherings with Friends and Family: Not on file  . Attends Religious Services: Not on file  . Active Member of Clubs or Organizations: Not on file  . Attends Archivist Meetings: Not on file  . Marital Status: Not on file     Family History: The patient's family history includes Healthy in his brother, brother, and sister; Other in his father and mother.  ROS:   Please see the history of present illness.    *** All other systems reviewed and are negative.  EKGs/Labs/Other Studies Reviewed:    The following studies  were reviewed today: ***  EKG:  EKG ***  Recent Labs: 08/07/2020: ALT 16; BUN 11; Creatinine, Ser 1.57; Potassium 3.6; Sodium 137  Recent Lipid Panel    Component Value Date/Time   CHOL 103 08/07/2020 1207   TRIG 146 08/07/2020 1207   HDL 34 (L) 08/07/2020 1207   CHOLHDL 3.0 08/07/2020 1207   CHOLHDL 6.0 03/25/2016 1030   VLDL 37 03/25/2016 1030   LDLCALC 44 08/07/2020 1207    Physical Exam:    VS:  There were no vitals taken for this visit.    Wt Readings from Last 3 Encounters:  03/16/20 200 lb (90.7 kg)  01/29/20 205 lb 3.2 oz (93.1 kg)  07/04/19 207 lb 12.8 oz (94.3 kg)     GEN: ***. No acute distress HEENT: Normal NECK: No JVD. LYMPHATICS: No lymphadenopathy CARDIAC: *** RRR without murmur, gallop, or  edema. VASCULAR: *** Normal Pulses. No bruits. RESPIRATORY:  Clear to auscultation without rales, wheezing or rhonchi  ABDOMEN: Soft, non-tender, non-distended, No pulsatile mass, MUSCULOSKELETAL: No deformity  SKIN: Warm and dry NEUROLOGIC:  Alert and oriented x 3 PSYCHIATRIC:  Normal affect   ASSESSMENT:    1. Chronic diastolic HF (heart failure) ([place])   2. Hypertension, essential   3. Mixed hyperlipidemia   4. Abnormal EKG   5. TIA (transient ischemic attack)   6. Type 2 diabetes mellitus without complication, without long-term current use of insulin (HCC)   7. Stage 3b chronic kidney disease ([place])   8. Educated about COVID-19 virus infection    PLAN:    In order of problems listed above:  1. ***   Medication Adjustments/Labs and Tests Ordered: Current medicines are reviewed at length with the patient today.  Concerns regarding medicines are outlined above.  No orders of the defined types were placed in this encounter.  No orders of the defined types were placed in this encounter.   There are no Patient Instructions on file for this visit.   Signed, Sinclair Grooms, MD  08/20/2020 6:21 PM    [place]

## 2020-08-21 ENCOUNTER — Ambulatory Visit: Payer: Medicare PPO | Admitting: Interventional Cardiology

## 2020-08-21 DIAGNOSIS — G459 Transient cerebral ischemic attack, unspecified: Secondary | ICD-10-CM

## 2020-08-21 DIAGNOSIS — E119 Type 2 diabetes mellitus without complications: Secondary | ICD-10-CM

## 2020-08-21 DIAGNOSIS — Z7189 Other specified counseling: Secondary | ICD-10-CM

## 2020-08-21 DIAGNOSIS — R9431 Abnormal electrocardiogram [ECG] [EKG]: Secondary | ICD-10-CM

## 2020-08-21 DIAGNOSIS — N1832 Chronic kidney disease, stage 3b: Secondary | ICD-10-CM

## 2020-08-21 DIAGNOSIS — I5032 Chronic diastolic (congestive) heart failure: Secondary | ICD-10-CM

## 2020-08-21 DIAGNOSIS — I10 Essential (primary) hypertension: Secondary | ICD-10-CM

## 2020-08-21 DIAGNOSIS — E782 Mixed hyperlipidemia: Secondary | ICD-10-CM

## 2020-11-13 LAB — COMPREHENSIVE METABOLIC PANEL: Calcium: 9.4 (ref 8.7–10.7)

## 2020-11-13 LAB — BASIC METABOLIC PANEL
BUN: 12 (ref 4–21)
CO2: 26 — AB (ref 13–22)
Chloride: 102 (ref 99–108)
Creatinine: 1.5 — AB (ref <=1.3)
Creatinine: 1.7 — AB (ref <=1.3)
Glucose: 163
Potassium: 3.8 mEq/L (ref 3.5–5.1)
Sodium: 138 (ref 137–147)

## 2021-01-07 ENCOUNTER — Ambulatory Visit: Payer: Medicare PPO | Attending: Internal Medicine

## 2021-01-07 DIAGNOSIS — Z23 Encounter for immunization: Secondary | ICD-10-CM

## 2021-01-07 NOTE — Progress Notes (Signed)
   Covid-19 Vaccination Clinic  Name:  Michael Hudson    MRN: [id number] DOB: 05/18/1941  01/07/2021  [name] was observed post Covid-19 immunization for 15 minutes without incident. He was provided with Vaccine Information Sheet and instruction to access the V-Safe system.   [name] was instructed to call 911 with any severe reactions post vaccine: Marland Kitchen Difficulty breathing  . Swelling of face and throat  . A fast heartbeat  . A bad rash all over body  . Dizziness and weakness   Immunizations Administered    Name Date Dose VIS Date Route   PFIZER Comrnaty([place] TOP) Covid-19 Vaccine 01/07/2021  2:28 PM 0.3 mL 09/05/2020 Intramuscular   Manufacturer: [place]   Lot: [id number]   NDC: [phone]

## 2021-01-11 ENCOUNTER — Other Ambulatory Visit: Payer: Self-pay | Admitting: Interventional Cardiology

## 2021-01-13 ENCOUNTER — Other Ambulatory Visit (HOSPITAL_BASED_OUTPATIENT_CLINIC_OR_DEPARTMENT_OTHER): Payer: Self-pay

## 2021-01-13 MED ORDER — PFIZER-BIONT COVID-19 VAC-TRIS 30 MCG/0.3ML IM SUSP
INTRAMUSCULAR | 0 refills | Status: DC
  Filled 2021-01-13: qty 0.3, 1d supply, fill #0

## 2021-01-16 ENCOUNTER — Other Ambulatory Visit: Payer: Self-pay | Admitting: Interventional Cardiology

## 2021-02-05 ENCOUNTER — Other Ambulatory Visit: Payer: Self-pay | Admitting: Interventional Cardiology

## 2021-02-09 NOTE — Progress Notes (Signed)
Cardiology Office Note:    Date:  02/10/2021   ID:  MIKKEL CHARRETTE, DOB [birth year], MRN [id number]  PCP:  Charleston Poot, MD  Cardiologist:  Sinclair Grooms, MD   Referring MD: Charleston Poot, MD   Chief Complaint  Patient presents with  . Coronary Artery Disease  . Congestive Heart Failure    History of Present Illness:    Michael Hudson is a 80 y.o. male with a hx of CVA, DM, HTN, HLD, CKD stage III, and chronic diastolic HF.  He has no complaints.  He is not engaging spontaneously in conversation.  Says he feels okay.  Most recent creatinine was 1.57 which is an improvement compared to September 2020 when creatinine was 1.81.  Past Medical History:  Diagnosis Date  . Abnormal EKG 03/25/2016  . Benign tumor of skin   . CVA (cerebral infarction) 03/24/2016  . Diabetes mellitus without complication ([place])   . Enlarged prostate   . Hypercholesterolemia   . Hyperlipidemia   . Hypertension   . Hypertrophy of prostate    with urinary obstruction and other lower urinary tract  . Hypokalemia 03/25/2016  . Left-sided weakness 03/24/2016  . LVH (left ventricular hypertrophy)   . Primary osteoarthritis involving multiple joints   . TIA (transient ischemic attack) 03/24/2016  . Type 2 diabetes mellitus without complication, without long-term current use of insulin ([place])     History reviewed. No pertinent surgical history.  Current Medications: Current Meds  Medication Sig  . amLODipine (NORVASC) 5 MG tablet Take 1.5 tablets (7.5 mg total) by mouth daily.  Marland Kitchen aspirin EC 81 MG tablet Take 81 mg by mouth daily.  Marland Kitchen glipiZIDE (GLUCOTROL XL) 10 MG 24 hr tablet Take 10 mg by mouth 2 (two) times daily.   Marland Kitchen losartan (COZAAR) 100 MG tablet Take 100 mg by mouth daily.  . metFORMIN (GLUMETZA) 1000 MG (MOD) 24 hr tablet Take 1,000 mg by mouth 2 (two) times daily with a meal.   . metoprolol succinate (TOPROL-XL) 100 MG 24 hr tablet Take 150 mg by mouth daily. Take with or immediately  following a meal.  . potassium chloride (KLOR-CON) 10 MEQ tablet TAKE 2 TABLETS BY MOUTH DAILY  . rosuvastatin (CRESTOR) 20 MG tablet Take 1 tablet (20 mg total) by mouth daily. Please keep upcoming appointment in May 2022 for future refills. Thank you  . [DISCONTINUED] furosemide (LASIX) 40 MG tablet TAKE 1 TABLET BY MOUTH EVERY DAY     Allergies:   Shellfish allergy, Neosporin [neomycin-bacitracin zn-polymyx], and Zinc   Social History   Socioeconomic History  . Marital status: Married    Spouse name: Not on file  . Number of children: Not on file  . Years of education: Not on file  . Highest education level: Not on file  Occupational History  . Not on file  Tobacco Use  . Smoking status: Never Smoker  . Smokeless tobacco: Never Used  Vaping Use  . Vaping Use: Never used  Substance and Sexual Activity  . Alcohol use: No  . Drug use: No  . Sexual activity: Not on file    Comment: married  Other Topics Concern  . Not on file  Social History Narrative  . Not on file   Social Determinants of Health   Financial Resource Strain: Not on file  Food Insecurity: Not on file  Transportation Needs: Not on file  Physical Activity: Not on file  Stress: Not on file  Social Connections:  Not on file     Family History: The patient's family history includes Healthy in his brother, brother, and sister; Other in his father and mother.  ROS:   Please see the history of present illness.    No specific complaints.  Says he feels okay.  Does not sound like he is exercising.  No trouble sleeping.  No orthopnea.  No lower extremity swelling.  All other systems reviewed and are negative.  EKGs/Labs/Other Studies Reviewed:    The following studies were reviewed today: No blood work since that ordered by [place] in November 2021  Hemoglobin A1c 7.7  Creatinine 1.57  EKG:  EKG normal sinus rhythm, repolarization abnormality V4 through V6 as well as inferior leads.  When compared to prior  pacing, no changes noted.  Recent Labs: 08/07/2020: ALT 16; BUN 11; Creatinine, Ser 1.57; Potassium 3.6; Sodium 137  Recent Lipid Panel    Component Value Date/Time   CHOL 103 08/07/2020 1207   TRIG 146 08/07/2020 1207   HDL 34 (L) 08/07/2020 1207   CHOLHDL 3.0 08/07/2020 1207   CHOLHDL 6.0 03/25/2016 1030   VLDL 37 03/25/2016 1030   LDLCALC 44 08/07/2020 1207    Physical Exam:    VS:  BP 128/68   Pulse 76   Ht 5' 9.5" (1.765 m)   Wt 201 lb (91.2 kg)   SpO2 97%   BMI 29.26 kg/m     Wt Readings from Last 3 Encounters:  02/10/21 201 lb (91.2 kg)  03/16/20 200 lb (90.7 kg)  01/29/20 205 lb 3.2 oz (93.1 kg)     GEN: Slightly overweight. No acute distress HEENT: Normal NECK: No JVD. LYMPHATICS: No lymphadenopathy CARDIAC: No murmur. RRR no gallop, or edema. VASCULAR:  Normal Pulses. No bruits. RESPIRATORY:  Clear to auscultation without rales, wheezing or rhonchi  ABDOMEN: Soft, non-tender, non-distended, No pulsatile mass, MUSCULOSKELETAL: No deformity  SKIN: Warm and dry NEUROLOGIC:  Alert and oriented x 3 PSYCHIATRIC:  Normal affect   ASSESSMENT:    1. Chronic diastolic HF (heart failure) ([place])   2. Hypertension, essential   3. Mixed hyperlipidemia   4. Abnormal EKG   5. TIA (transient ischemic attack)   6. Type 2 diabetes mellitus without complication, without long-term current use of insulin (HCC)   7. Stage 3b chronic kidney disease ([place])    PLAN:    In order of problems listed above:  1. Evidence of volume overload 2. Blood pressure is well controlled 3. Excellent control on Crestor 10 mg/day. 4. No change 5. No new symptoms 6. Farxiga 5 mg/day.  Consider discontinuing Glucotrol.  Basic metabolic panel today.  Coordinate with primary care. 7. Basic metabolic panel today.   Medication Adjustments/Labs and Tests Ordered: Current medicines are reviewed at length with the patient today.  Concerns regarding medicines are outlined above.  Orders  Placed This Encounter  Procedures  . EKG 12-Lead   Meds ordered this encounter  Medications  . furosemide (LASIX) 40 MG tablet    Sig: Take 1 tablet (40 mg total) by mouth daily.    Dispense:  90 tablet    Refill:  3    There are no Patient Instructions on file for this visit.   Signed, Sinclair Grooms, MD  02/10/2021 2:28 PM     [facility]

## 2021-02-10 ENCOUNTER — Ambulatory Visit: Payer: Medicare PPO | Admitting: Interventional Cardiology

## 2021-02-10 ENCOUNTER — Other Ambulatory Visit: Payer: Self-pay

## 2021-02-10 ENCOUNTER — Encounter: Payer: Self-pay | Admitting: Interventional Cardiology

## 2021-02-10 VITALS — BP 128/68 | HR 76 | Ht 69.5 in | Wt 201.0 lb

## 2021-02-10 DIAGNOSIS — E119 Type 2 diabetes mellitus without complications: Secondary | ICD-10-CM

## 2021-02-10 DIAGNOSIS — R9431 Abnormal electrocardiogram [ECG] [EKG]: Secondary | ICD-10-CM | POA: Diagnosis not present

## 2021-02-10 DIAGNOSIS — I5032 Chronic diastolic (congestive) heart failure: Secondary | ICD-10-CM | POA: Diagnosis not present

## 2021-02-10 DIAGNOSIS — N1832 Chronic kidney disease, stage 3b: Secondary | ICD-10-CM

## 2021-02-10 DIAGNOSIS — E782 Mixed hyperlipidemia: Secondary | ICD-10-CM | POA: Diagnosis not present

## 2021-02-10 DIAGNOSIS — I10 Essential (primary) hypertension: Secondary | ICD-10-CM | POA: Diagnosis not present

## 2021-02-10 DIAGNOSIS — G459 Transient cerebral ischemic attack, unspecified: Secondary | ICD-10-CM

## 2021-02-10 MED ORDER — DAPAGLIFLOZIN PROPANEDIOL 5 MG PO TABS
5.0000 mg | ORAL_TABLET | Freq: Every day | ORAL | 11 refills | Status: DC
Start: 2021-02-10 — End: 2022-03-23

## 2021-02-10 MED ORDER — FUROSEMIDE 40 MG PO TABS: 40.0000 mg | ORAL_TABLET | Freq: Every day | ORAL | 3 refills | Status: DC

## 2021-02-10 NOTE — Patient Instructions (Addendum)
Medication Instructions:  1) START Farxiga 5mg  once daily  *If you need a refill on your cardiac medications before your next appointment, please call your pharmacy*   Lab Work: BMET, Lipid, Liver and A1C  If you have labs (blood work) drawn today and your tests are completely normal, you will receive your results only by: Marland Kitchen MyChart Message (if you have MyChart) OR . A paper copy in the mail If you have any lab test that is abnormal or we need to change your treatment, we will call you to review the results.   Testing/Procedures: None   Follow-Up: At [facility], you and your health needs are our priority.  As part of our continuing mission to provide you with exceptional heart care, we have created designated Provider Care Teams.  These Care Teams include your primary Cardiologist (physician) and Advanced Practice Providers (APPs -  Physician Assistants and Nurse Practitioners) who all work together to provide you with the care you need, when you need it.  We recommend signing up for the patient portal called "MyChart".  Sign up information is provided on this After Visit Summary.  MyChart is used to connect with patients for Virtual Visits (Telemedicine).  Patients are able to view lab/test results, encounter notes, upcoming appointments, etc.  Non-urgent messages can be sent to your provider as well.   To learn more about what you can do with MyChart, go to [email or web address].    Your next appointment:   6 month(s)  The format for your next appointment:   In Person  Provider:   You may see Sinclair Grooms, MD or one of the following Advanced Practice Providers on your designated Care Team:    Kathyrn Drown, NP    Other Instructions

## 2021-02-11 LAB — BASIC METABOLIC PANEL
BUN/Creatinine Ratio: 6 — ABNORMAL LOW (ref 10–24)
BUN: 9 mg/dL (ref 8–27)
CO2: 24 mmol/L (ref 20–29)
Calcium: 9.4 mg/dL (ref 8.6–10.2)
Chloride: 100 mmol/L (ref 96–106)
Creatinine, Ser: 1.54 mg/dL — ABNORMAL HIGH (ref 0.76–1.27)
Glucose: 74 mg/dL (ref 65–99)
Potassium: 4 mmol/L (ref 3.5–5.2)
Sodium: 143 mmol/L (ref 134–144)
eGFR: 46 mL/min/{1.73_m2} — ABNORMAL LOW (ref >59)

## 2021-02-11 LAB — LIPID PANEL
Chol/HDL Ratio: 3.1 ratio (ref 0.0–5.0)
Cholesterol, Total: 103 mg/dL (ref 100–199)
HDL: 33 mg/dL — ABNORMAL LOW (ref >39)
LDL Chol Calc (NIH): 54 mg/dL (ref 0–99)
Triglycerides: 80 mg/dL (ref 0–149)
VLDL Cholesterol Cal: 16 mg/dL (ref 5–40)

## 2021-02-11 LAB — HEPATIC FUNCTION PANEL
ALT: 18 IU/L (ref 0–44)
AST: 17 IU/L (ref 0–40)
Albumin: 5.1 g/dL — ABNORMAL HIGH (ref 3.7–4.7)
Alkaline Phosphatase: 74 IU/L (ref 44–121)
Bilirubin Total: 0.3 mg/dL (ref 0.0–1.2)
Bilirubin, Direct: <0.1 mg/dL (ref 0.00–0.40)
Total Protein: 7.8 g/dL (ref 6.0–8.5)

## 2021-02-11 LAB — HEMOGLOBIN A1C
Est. average glucose Bld gHb Est-mCnc: 192 mg/dL
Hgb A1c MFr Bld: 8.3 % — ABNORMAL HIGH (ref 4.8–5.6)

## 2021-02-13 ENCOUNTER — Telehealth: Payer: Self-pay | Admitting: Interventional Cardiology

## 2021-02-13 NOTE — Telephone Encounter (Signed)
New message:     Patient wife calling to speak with the nurse Anderson Malta. Please advise.

## 2021-02-13 NOTE — Telephone Encounter (Signed)
Spoke with wife and she states the pharmacy told her that Wilder Glade was going to be $64 for a one month supply.  She is willing to pick it up at this price this time but states this is not affordable for the long run.  Advised I will send message to our Prior Auth nurse to see if a PA can be done and if not, if patient assistance would be an option.  Pt did go home with the discount card but insurance is rejecting the card.  Wife appreciative for call.

## 2021-02-14 NOTE — Telephone Encounter (Signed)
**Note De-Identified Ege Muckey Obfuscation** No answer so I left a message on the pts VM asking that he or his wife call Jeani Hawking back at Dr Darliss Ridgel office at [facility] at [phone] concerning his Wilder Glade.

## 2021-02-14 NOTE — Telephone Encounter (Signed)
Pt's wife is returning a call  

## 2021-02-17 NOTE — Telephone Encounter (Signed)
**Note De-Identified Ariyon Gerstenberger Obfuscation** The pts wife states that they will try to afford [place] monthly buy that the cost is too high.  I mentioned applying for pt asst through [employer] but she quickly refused as she stated she does not want to fill out the application nor share their personnel finances with anyone. She stated "Im not begging for anything from anyone".  I again asked her if they are going to pay oop for the pts Wilder Glade and she stated they are and will let us know if they get to a point where they can no longer afford.

## 2021-02-20 ENCOUNTER — Telehealth: Payer: Self-pay | Admitting: Interventional Cardiology

## 2021-02-20 DIAGNOSIS — I5032 Chronic diastolic (congestive) heart failure: Secondary | ICD-10-CM

## 2021-02-20 NOTE — Telephone Encounter (Signed)
Spoke with wife and she was concerned about pt starting Wilder Glade because it mentions that it can cause a pt to go into DKA and cause kidney issues.  She was concerned about him being on Furosemide and [place].  Advised that was one reason why we checked labs prior to starting the medication and that we will recheck BMET once he starts it.  She has not spoken with pt's PCP but did speak with HER Endocrinologist who told her that switching from Amaryl to [place] was a good idea for Mr. Brickley.  Wife will get pt started on Farxiga tomorrow. Plan to check labs on 6/7 to monitor kidney function.  Wife appreciative for call.

## 2021-02-20 NOTE — Telephone Encounter (Signed)
New message:     Patient calling to ask some questions concering some medication. Please call patient.

## 2021-03-04 ENCOUNTER — Other Ambulatory Visit: Payer: Self-pay

## 2021-03-04 ENCOUNTER — Telehealth: Payer: Self-pay | Admitting: Interventional Cardiology

## 2021-03-04 ENCOUNTER — Other Ambulatory Visit: Payer: Medicare PPO

## 2021-03-04 DIAGNOSIS — I5032 Chronic diastolic (congestive) heart failure: Secondary | ICD-10-CM

## 2021-03-04 LAB — BASIC METABOLIC PANEL
BUN/Creatinine Ratio: 10 (ref 10–24)
BUN: 19 mg/dL (ref 8–27)
CO2: 22 mmol/L (ref 20–29)
Calcium: 9.5 mg/dL (ref 8.6–10.2)
Chloride: 102 mmol/L (ref 96–106)
Creatinine, Ser: 1.98 mg/dL — ABNORMAL HIGH (ref 0.76–1.27)
Glucose: 150 mg/dL — ABNORMAL HIGH (ref 65–99)
Potassium: 3.9 mmol/L (ref 3.5–5.2)
Sodium: 140 mmol/L (ref 134–144)
eGFR: 34 mL/min/{1.73_m2} — ABNORMAL LOW (ref >59)

## 2021-03-04 NOTE — Telephone Encounter (Signed)
Received a message that wife was calling to check on husband's labs.  Returned call with no answer and left message to return call but that there were no current labs to report.

## 2021-03-04 NOTE — Telephone Encounter (Signed)
Wife returned call and iI nformed her the only labs I saw were done in May.  She stated they had some done today and I let her know the doctor has not reviewed those labs yet but when he does and sends [place] the results, someone will call.  Patient appreciative of call.

## 2021-03-04 NOTE — Telephone Encounter (Signed)
Patients wife called to get lab results 

## 2021-03-07 ENCOUNTER — Telehealth: Payer: Self-pay | Admitting: Cardiology

## 2021-03-07 NOTE — Telephone Encounter (Signed)
Nd wife did not understand dose of lasix. I explained 20 mg daily, half a 40 mg tab. She was understanding

## 2021-03-10 ENCOUNTER — Other Ambulatory Visit: Payer: Self-pay | Admitting: *Deleted

## 2021-03-10 DIAGNOSIS — I5032 Chronic diastolic (congestive) heart failure: Secondary | ICD-10-CM

## 2021-03-10 DIAGNOSIS — I10 Essential (primary) hypertension: Secondary | ICD-10-CM

## 2021-03-10 MED ORDER — FUROSEMIDE 20 MG PO TABS
20.0000 mg | ORAL_TABLET | Freq: Every day | ORAL | 3 refills | Status: DC
Start: 2021-03-10 — End: 2022-02-26

## 2021-03-14 ENCOUNTER — Other Ambulatory Visit: Payer: Self-pay

## 2021-03-14 ENCOUNTER — Other Ambulatory Visit: Payer: Medicare PPO | Admitting: *Deleted

## 2021-03-14 DIAGNOSIS — I5032 Chronic diastolic (congestive) heart failure: Secondary | ICD-10-CM

## 2021-03-14 DIAGNOSIS — I10 Essential (primary) hypertension: Secondary | ICD-10-CM

## 2021-03-15 LAB — BASIC METABOLIC PANEL
BUN/Creatinine Ratio: 8 — ABNORMAL LOW (ref 10–24)
BUN: 14 mg/dL (ref 8–27)
CO2: 20 mmol/L (ref 20–29)
Calcium: 9.5 mg/dL (ref 8.6–10.2)
Chloride: 102 mmol/L (ref 96–106)
Creatinine, Ser: 1.73 mg/dL — ABNORMAL HIGH (ref 0.76–1.27)
Glucose: 250 mg/dL — ABNORMAL HIGH (ref 65–99)
Potassium: 4 mmol/L (ref 3.5–5.2)
Sodium: 140 mmol/L (ref 134–144)
eGFR: 40 mL/min/{1.73_m2} — ABNORMAL LOW (ref >59)

## 2021-03-19 ENCOUNTER — Telehealth: Payer: Self-pay | Admitting: *Deleted

## 2021-03-19 DIAGNOSIS — I5032 Chronic diastolic (congestive) heart failure: Secondary | ICD-10-CM

## 2021-03-19 NOTE — Telephone Encounter (Signed)
Left message to call office

## 2021-03-19 NOTE — Telephone Encounter (Signed)
Patient's wife notified.  They have not been checking BP.  Will begin checking and let us know if running higher or lower than normal. Patient will come to office for BMP on October 6,2022

## 2021-03-19 NOTE — Telephone Encounter (Signed)
-----   Message from Belva Crome, MD sent at 03/17/2021  2:39 PM EDT ----- Let the patient know the labs are okay. BMET 3-4 months. Let us know if BP increased. A copy will be sent to Charleston Poot, MD

## 2021-03-21 LAB — CBC AND DIFFERENTIAL
HCT: 35 — AB (ref 41–53)
Hemoglobin: 11.2 — AB (ref 13.5–17.5)
Platelets: 263 10*3/uL (ref 150–400)
WBC: 6

## 2021-03-21 LAB — TSH: TSH: 1.49 (ref 0.41–5.90)

## 2021-03-21 LAB — IRON,TIBC AND FERRITIN PANEL: Ferritin: 19

## 2021-03-21 LAB — PROTEIN / CREATININE RATIO, URINE: Albumin, U: 7.6

## 2021-03-21 LAB — HEMOGLOBIN A1C: Hemoglobin A1C: 7.9

## 2021-03-21 LAB — CBC: RBC: 4.09 (ref 3.87–5.11)

## 2021-03-21 LAB — VITAMIN B12: Vitamin B-12: 244

## 2021-03-24 ENCOUNTER — Telehealth: Payer: Self-pay | Admitting: Interventional Cardiology

## 2021-03-24 NOTE — Telephone Encounter (Signed)
BP is acceptable as he will be 80 on next BP (<target 140/80). Important to limit salt and exercise.

## 2021-03-24 NOTE — Telephone Encounter (Signed)
Spoke with wife and she wanted to give BP readings:  6/22- 9A- 131/84, 12P- 139/84 6/24- 1020P- 143/84 6/25- 1020A- 144/90 6/26- 148/86  Per wife pt doesn't want her to cook so they eat out for almost every meal.  Takes AM meds anytime between 1030-1130.  Advised I will send to Dr. Tamala Julian for review and advisement.  Wife did mention that pt seen PCP last week and A1C is down to 7.9.  Our check in May was 8.3.

## 2021-03-24 NOTE — Telephone Encounter (Signed)
    Pt's wife requesting to speak with Anderson Malta, she said it's regarding pt's BP update. She wanted to give it to [facility]

## 2021-03-26 NOTE — Telephone Encounter (Signed)
Spoke with pt's wife and made her aware of information from Dr. Tamala Julian.  Wife appreciative for call.

## 2021-04-08 ENCOUNTER — Other Ambulatory Visit: Payer: Self-pay | Admitting: Interventional Cardiology

## 2021-04-14 ENCOUNTER — Other Ambulatory Visit: Payer: Self-pay | Admitting: *Deleted

## 2021-04-14 ENCOUNTER — Telehealth: Payer: Self-pay | Admitting: Interventional Cardiology

## 2021-04-14 MED ORDER — POTASSIUM CHLORIDE ER 10 MEQ PO TBCR: 20.0000 meq | EXTENDED_RELEASE_TABLET | Freq: Every day | ORAL | 2 refills | Status: DC

## 2021-04-14 NOTE — Telephone Encounter (Signed)
Rx has been sent in as requested.  

## 2021-04-14 NOTE — Telephone Encounter (Signed)
*  STAT* If patient is at the pharmacy, call can be transferred to refill team.   1. Which medications need to be refilled? (please list name of each medication and dose if known) potassium chloride (KLOR-CON) 10 MEQ tablet  2. Which pharmacy/location (including street and city if local pharmacy) is medication to be sent to? [id number] [place],  - [address] AT [address]OSS FROM [address]  3. Do they need a 30 day or 90 day supply? 90 day supply   Pt is out of his medication. He has a appt in November but not enough medication yo get him threw

## 2021-05-14 DIAGNOSIS — E538 Deficiency of other specified B group vitamins: Secondary | ICD-10-CM | POA: Insufficient documentation

## 2021-05-14 DIAGNOSIS — D649 Anemia, unspecified: Secondary | ICD-10-CM | POA: Insufficient documentation

## 2021-05-21 ENCOUNTER — Telehealth: Payer: Self-pay | Admitting: Interventional Cardiology

## 2021-05-21 NOTE — Telephone Encounter (Signed)
New Message:     Patient is going to have a Colonoscopy and they want him to take 350 mg of Iron. Patient wants to know if it is alright for him to take this please?

## 2021-05-27 LAB — HM DIABETES EYE EXAM

## 2021-05-27 NOTE — Telephone Encounter (Signed)
Spoke with wife and made her aware.  Wife appreciative for call.

## 2021-05-27 NOTE — Telephone Encounter (Signed)
Yes

## 2021-06-09 ENCOUNTER — Other Ambulatory Visit: Payer: Self-pay | Admitting: Physician Assistant

## 2021-06-26 LAB — CBC AND DIFFERENTIAL
HCT: 36 — AB (ref 41–53)
Hemoglobin: 11.6 — AB (ref 13.5–17.5)
Platelets: 256 10*3/uL (ref 150–400)
WBC: 7

## 2021-06-26 LAB — HEMOGLOBIN A1C: Hemoglobin A1C: 8.8

## 2021-06-26 LAB — CBC: RBC: 4.19 (ref 3.87–5.11)

## 2021-06-27 ENCOUNTER — Telehealth: Payer: Self-pay | Admitting: Interventional Cardiology

## 2021-06-27 NOTE — Telephone Encounter (Signed)
Pt c/o medication issue:  1. Name of Medication: dapagliflozin propanediol (FARXIGA) 5 MG TABS tablet  2. How are you currently taking this medication (dosage and times per day)? As directed  3. Are you having a reaction (difficulty breathing--STAT)?   4. What is your medication issue? Patient's PCP wants the patient to increase the dosage to 20 mg daily. The patient's A1C has gone up to 8.8, so the PCP wants to increase the dosage  The wife wanted advice from Dr. Tamala Julian before she goes and updates the dosage

## 2021-06-27 NOTE — Telephone Encounter (Signed)
Called patient's wife (DPR), she stated patient's PCP wants to up the dose on patient's Farxiga due to his Hgb A1c going up. Patient's wife wants to know what Dr. Tamala Julian thinks about this. Will forward to Dr. Tamala Julian for advisement.

## 2021-06-28 NOTE — Telephone Encounter (Signed)
I agree with the recommendation. It is appropriate to do so and the higher dose gives even more vascular and kidney protection.

## 2021-06-30 NOTE — Telephone Encounter (Signed)
Spoke to wife- advised of note below.  They increase Farxiga to 10 mg not 20 mg- but still advised okay per message from [place].

## 2021-06-30 NOTE — Telephone Encounter (Signed)
Patient's wife returning call. 

## 2021-06-30 NOTE — Telephone Encounter (Signed)
Called patient, gave response from MD-  LVM to call back if questions.

## 2021-07-03 ENCOUNTER — Other Ambulatory Visit: Payer: Medicare PPO

## 2021-07-03 ENCOUNTER — Other Ambulatory Visit: Payer: Self-pay

## 2021-07-03 DIAGNOSIS — I5032 Chronic diastolic (congestive) heart failure: Secondary | ICD-10-CM

## 2021-07-03 LAB — BASIC METABOLIC PANEL
BUN/Creatinine Ratio: 11 (ref 10–24)
BUN: 19 mg/dL (ref 8–27)
CO2: 21 mmol/L (ref 20–29)
Calcium: 9.4 mg/dL (ref 8.6–10.2)
Chloride: 101 mmol/L (ref 96–106)
Creatinine, Ser: 1.74 mg/dL — ABNORMAL HIGH (ref 0.76–1.27)
Glucose: 121 mg/dL — ABNORMAL HIGH (ref 70–99)
Potassium: 4 mmol/L (ref 3.5–5.2)
Sodium: 138 mmol/L (ref 134–144)
eGFR: 39 mL/min/{1.73_m2} — ABNORMAL LOW (ref >59)

## 2021-08-24 NOTE — Progress Notes (Signed)
Cardiology Office Note:    Date:  08/26/2021   ID:  Michael Hudson, DOB [birth year], MRN [id number]  PCP:  Charleston Poot, MD  Cardiologist:  Sinclair Grooms, MD   Referring MD: Charleston Poot, MD   Chief Complaint  Patient presents with   Hypertension   Follow-up    CKD    History of Present Illness:    Michael Hudson is a 80 y.o. male with a hx of CVA, DM, HTN, HLD, CKD stage III, and chronic diastolic HF.  He has no complaints.  Not very conversant on today's office visit.  His wife says that he mostly sits and watches TV ([employer]).  He is not getting much in the way of daily physical activity.  Denies shortness of breath with physical activity.  Denies angina.  No difficulty with orthopnea.  Past Medical History:  Diagnosis Date   Abnormal EKG 03/25/2016   Benign tumor of skin    CVA (cerebral infarction) 03/24/2016   Diabetes mellitus without complication (HCC)    Enlarged prostate    Hypercholesterolemia    Hyperlipidemia    Hypertension    Hypertrophy of prostate    with urinary obstruction and other lower urinary tract   Hypokalemia 03/25/2016   Left-sided weakness 03/24/2016   LVH (left ventricular hypertrophy)    Primary osteoarthritis involving multiple joints    TIA (transient ischemic attack) 03/24/2016   Type 2 diabetes mellitus without complication, without long-term current use of insulin ([place])     History reviewed. No pertinent surgical history.  Current Medications: Current Meds  Medication Sig   amLODipine (NORVASC) 5 MG tablet TAKE 1.5 TABLETS (7.5 MG TOTAL) BY MOUTH DAILY.   aspirin EC 81 MG tablet Take 81 mg by mouth daily.   Cyanocobalamin 2500 MCG CHEW Chew 2,500 mcg by mouth daily.   dapagliflozin propanediol (FARXIGA) 5 MG TABS tablet Take 1 tablet (5 mg total) by mouth daily before breakfast. (Patient taking differently: Take 10 mg by mouth daily before breakfast.)   Ferrous Sulfate (IRON) 325 (65 Fe) MG TABS Take 1 tablet by  mouth daily.   furosemide (LASIX) 20 MG tablet Take 1 tablet (20 mg total) by mouth daily.   losartan (COZAAR) 100 MG tablet Take 100 mg by mouth daily.   metFORMIN (GLUMETZA) 1000 MG (MOD) 24 hr tablet Take 1,000 mg by mouth 2 (two) times daily with a meal.    metoprolol succinate (TOPROL-XL) 100 MG 24 hr tablet Take 150 mg by mouth daily. Take with or immediately following a meal.   potassium chloride (KLOR-CON) 10 MEQ tablet Take 2 tablets (20 mEq total) by mouth daily.   rosuvastatin (CRESTOR) 20 MG tablet Take 1 tablet (20 mg total) by mouth daily. Please keep upcoming appointment in May 2022 for future refills. Thank you     Allergies:   Shellfish allergy, Neosporin [neomycin-bacitracin zn-polymyx], and Zinc   Social History   Socioeconomic History   Marital status: Married    Spouse name: Not on file   Number of children: Not on file   Years of education: Not on file   Highest education level: Not on file  Occupational History   Not on file  Tobacco Use   Smoking status: Never   Smokeless tobacco: Never  Vaping Use   Vaping Use: Never used  Substance and Sexual Activity   Alcohol use: No   Drug use: No   Sexual activity: Not on file  Comment: married  Other Topics Concern   Not on file  Social History Narrative   Not on file   Social Determinants of Health   Financial Resource Strain: Not on file  Food Insecurity: Not on file  Transportation Needs: Not on file  Physical Activity: Not on file  Stress: Not on file  Social Connections: Not on file     Family History: The patient's family history includes Healthy in his brother, brother, and sister; Other in his father and mother.  ROS:   Please see the history of present illness.    Frequent urination all other systems reviewed and are negative.  EKGs/Labs/Other Studies Reviewed:    The following studies were reviewed today: No new imaging  EKG:  EKG last EKG was performed in May and revealed LVH with  strain.  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  MRI imaging has not been pursued because of renal impairment.  Recent Labs: 02/10/2021: ALT 18 07/03/2021: BUN 19; Creatinine, Ser 1.74; Potassium 4.0; Sodium 138  Recent Lipid Panel    Component Value Date/Time   CHOL 103 02/10/2021 1456   TRIG 80 02/10/2021 1456   HDL 33 (L) 02/10/2021 1456   CHOLHDL 3.1 02/10/2021 1456   CHOLHDL 6.0 03/25/2016 1030   VLDL 37 03/25/2016 1030   LDLCALC 54 02/10/2021 1456    Physical Exam:    VS:  BP 140/72   Pulse 75   Ht 5\' 9"  (1.753 m)   Wt 191 lb 8 oz (86.9 kg)   SpO2 98%   BMI 28.28 kg/m     Wt Readings from Last 3 Encounters:  08/26/21 191 lb 8 oz (86.9 kg)  02/10/21 201 lb (91.2 kg)  03/16/20 200 lb (90.7 kg)     GEN: Appears younger than stated age. No acute distress HEENT: Normal NECK: No JVD. LYMPHATICS: No lymphadenopathy CARDIAC: No murmur. RRR no gallop, or edema. VASCULAR:  Normal Pulses. No bruits. RESPIRATORY:  Clear to auscultation without rales, wheezing or rhonchi  ABDOMEN: Soft, non-tender, non-distended, No pulsatile mass, MUSCULOSKELETAL: No deformity  SKIN: Warm and dry NEUROLOGIC:  Alert and oriented x 3 PSYCHIATRIC:  Normal affect   ASSESSMENT:    1. Chronic diastolic HF (heart failure) ([place])   2. Hypertension, essential   3. Type 2 diabetes mellitus without complication, without long-term current use of insulin ([place])   4. TIA (transient ischemic attack)   5. Mixed hyperlipidemia   6. Stage 3b chronic kidney disease (HCC)    PLAN:    In order of problems listed above:  No evidence of volume overload on exam For age blood pressure is near the target of 140/80 mmHg or less.  I have encouraged decreased sodium intake and increasing physical activity. Last A1c was greater than 8.  I have encouraged him to get with primary to work on getting the blood sugar under better control. No new neurological complaints Continue high intensity  rosuvastatin. Continue to monitor stage IIIb CKD.  Last basic metabolic panel in October was 1.74.  That is improved.   Medication Adjustments/Labs and Tests Ordered: Current medicines are reviewed at length with the patient today.  Concerns regarding medicines are outlined above.  No orders of the defined types were placed in this encounter.  No orders of the defined types were placed in this encounter.   Patient Instructions  Medication Instructions:  Your physician recommends that you continue on your current medications as directed. Please refer to the Current  Medication list given to you today.  *If you need a refill on your cardiac medications before your next appointment, please call your pharmacy*   Lab Work: None If you have labs (blood work) drawn today and your tests are completely normal, you will receive your results only by: [place] (if you have MyChart) OR A paper copy in the mail If you have any lab test that is abnormal or we need to change your treatment, we will call you to review the results.   Testing/Procedures: None   Follow-Up: At [facility], you and your health needs are our priority.  As part of our continuing mission to provide you with exceptional heart care, we have created designated Provider Care Teams.  These Care Teams include your primary Cardiologist (physician) and Advanced Practice Providers (APPs -  Physician Assistants and Nurse Practitioners) who all work together to provide you with the care you need, when you need it.  We recommend signing up for the patient portal called "MyChart".  Sign up information is provided on this After Visit Summary.  MyChart is used to connect with patients for Virtual Visits (Telemedicine).  Patients are able to view lab/test results, encounter notes, upcoming appointments, etc.  Non-urgent messages can be sent to your provider as well.   To learn more about what you can do with MyChart, go to  [email or web address].    Your next appointment:   1 year(s)  The format for your next appointment:   In Person  Provider:   Sinclair Grooms, MD {    Other Instructions     Signed, Sinclair Grooms, MD  08/26/2021 3:00 PM    [place]

## 2021-08-26 ENCOUNTER — Ambulatory Visit: Payer: Medicare PPO | Admitting: Interventional Cardiology

## 2021-08-26 ENCOUNTER — Other Ambulatory Visit: Payer: Self-pay

## 2021-08-26 ENCOUNTER — Encounter: Payer: Self-pay | Admitting: Interventional Cardiology

## 2021-08-26 VITALS — BP 140/72 | HR 75 | Ht 69.0 in | Wt 191.5 lb

## 2021-08-26 DIAGNOSIS — G459 Transient cerebral ischemic attack, unspecified: Secondary | ICD-10-CM | POA: Diagnosis not present

## 2021-08-26 DIAGNOSIS — E119 Type 2 diabetes mellitus without complications: Secondary | ICD-10-CM

## 2021-08-26 DIAGNOSIS — E782 Mixed hyperlipidemia: Secondary | ICD-10-CM

## 2021-08-26 DIAGNOSIS — I5032 Chronic diastolic (congestive) heart failure: Secondary | ICD-10-CM

## 2021-08-26 DIAGNOSIS — I10 Essential (primary) hypertension: Secondary | ICD-10-CM | POA: Diagnosis not present

## 2021-08-26 DIAGNOSIS — N1832 Chronic kidney disease, stage 3b: Secondary | ICD-10-CM

## 2021-08-26 NOTE — Patient Instructions (Signed)

## 2021-09-26 LAB — COMPREHENSIVE METABOLIC PANEL: Calcium: 9.4 (ref 8.7–10.7)

## 2021-09-26 LAB — BASIC METABOLIC PANEL
BUN: 14 (ref 4–21)
CO2: 26 — AB (ref 13–22)
Chloride: 104 (ref 99–108)
Creatinine: 1.6 — AB (ref 0.6–1.3)
Glucose: 119
Potassium: 4.1 mEq/L (ref 3.5–5.1)
Sodium: 141 (ref 137–147)

## 2021-09-26 LAB — CBC AND DIFFERENTIAL
HCT: 36 — AB (ref 41–53)
Hemoglobin: 11.7 — AB (ref 13.5–17.5)
Platelets: 245 10*3/uL (ref 150–400)
WBC: 5.3

## 2021-09-26 LAB — HEMOGLOBIN A1C: Hemoglobin A1C: 8.2

## 2021-09-26 LAB — IRON,TIBC AND FERRITIN PANEL: Ferritin: 20

## 2021-09-26 LAB — CBC: RBC: 4.2 (ref 3.87–5.11)

## 2021-11-26 ENCOUNTER — Other Ambulatory Visit: Payer: Self-pay | Admitting: Interventional Cardiology

## 2021-12-13 IMAGING — CR DG HAND COMPLETE 3+V*R*
3 series · 3 of 3 positions shown · non-contrast
Comparison: None.

CLINICAL DATA: 78-year-old male with fall and pain over the fifth
MCP joint.

EXAM:
RIGHT HAND - COMPLETE 3+ VIEW

[x hand pa right]
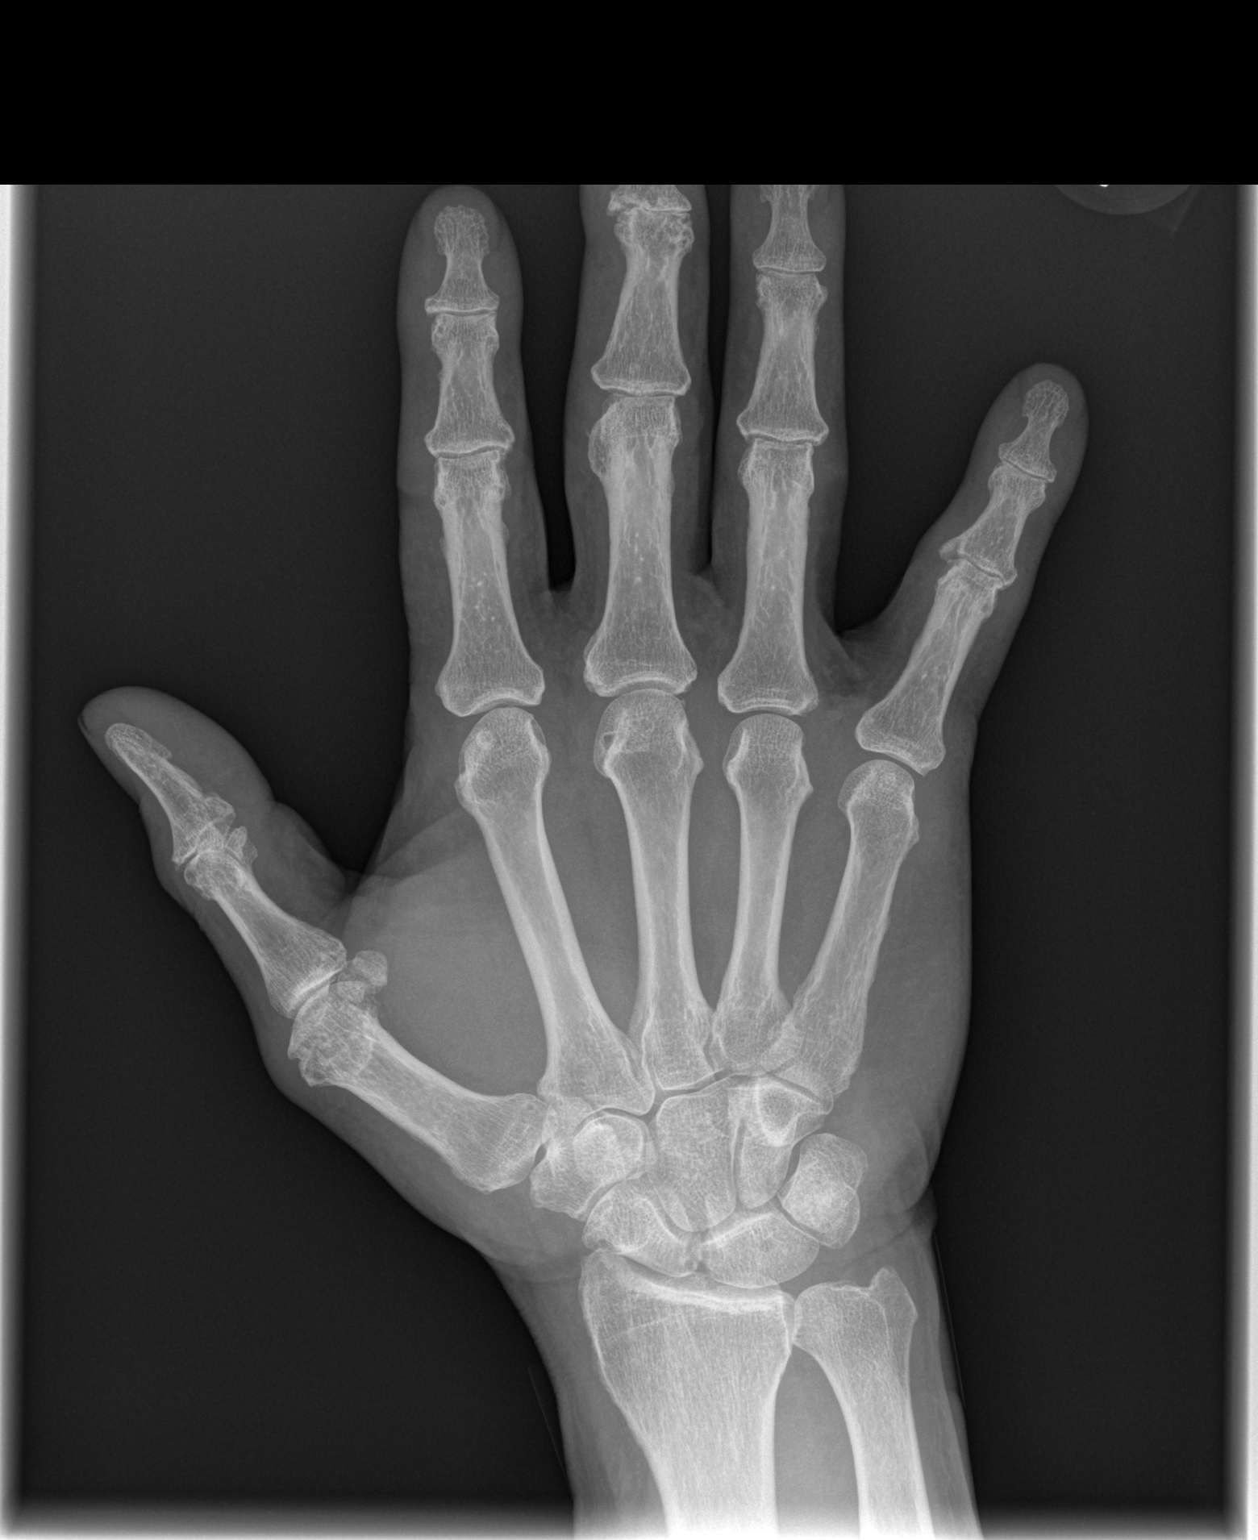

[x hand oblique right]
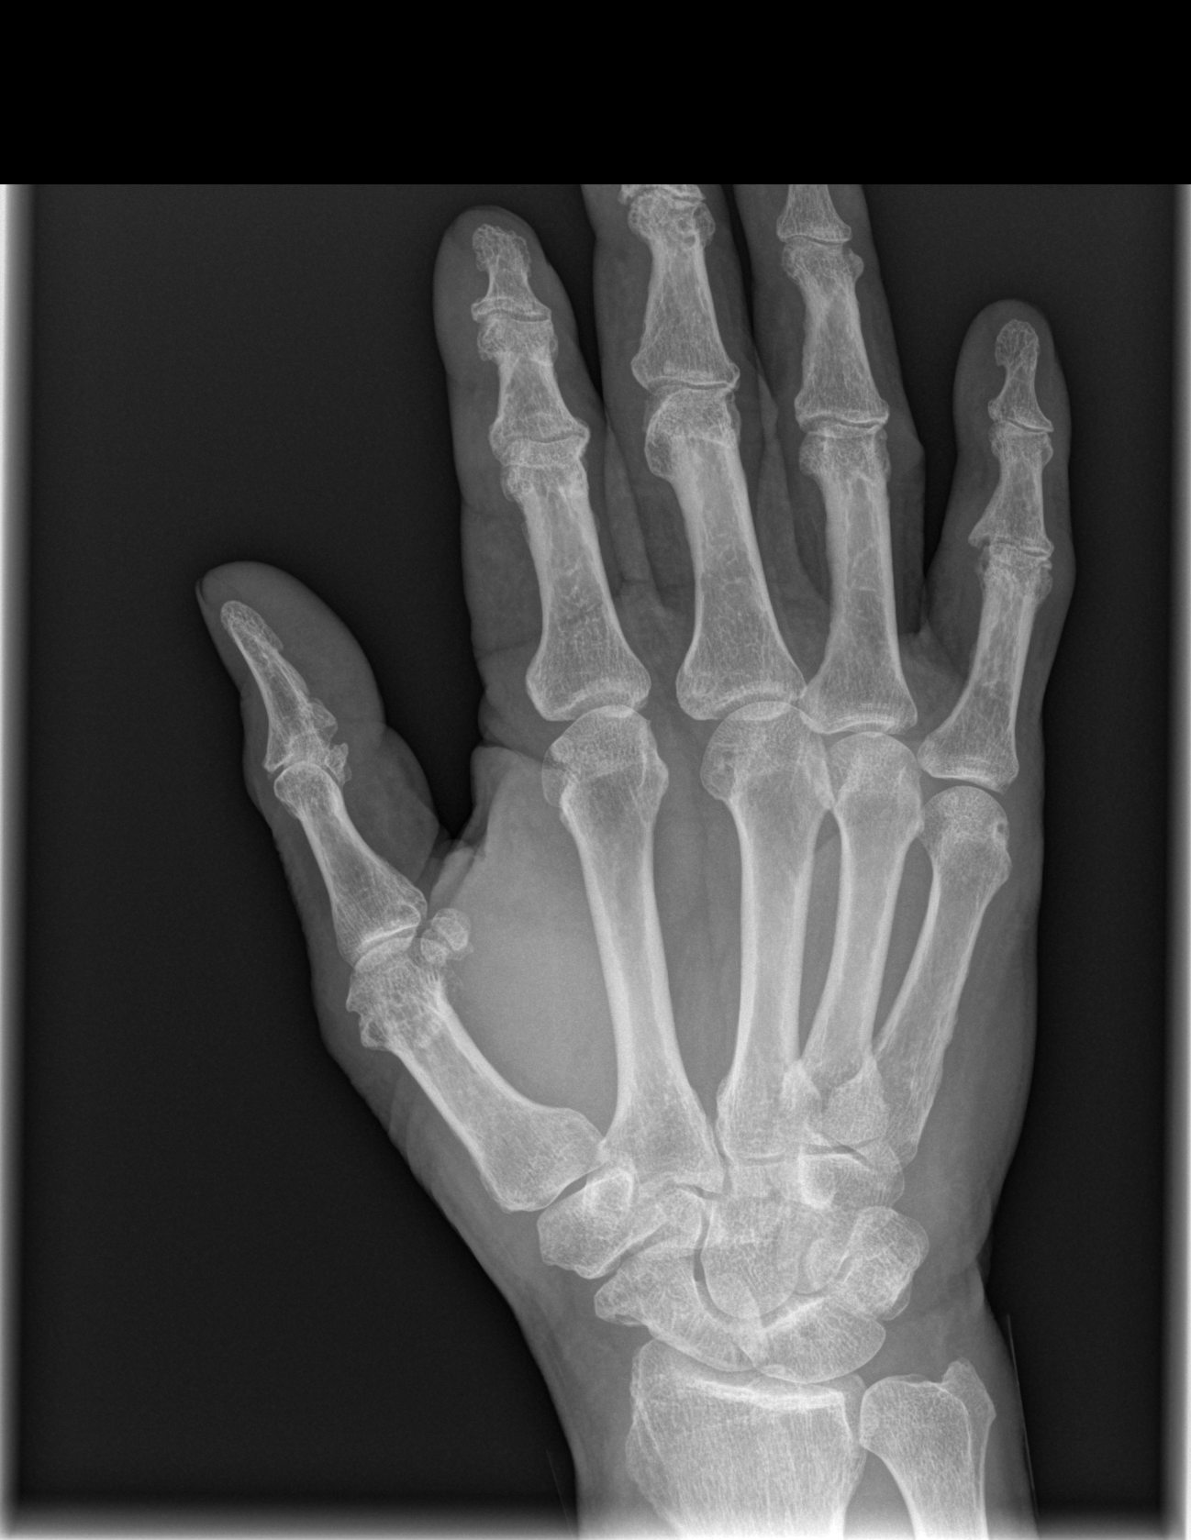

[x hand lat right]
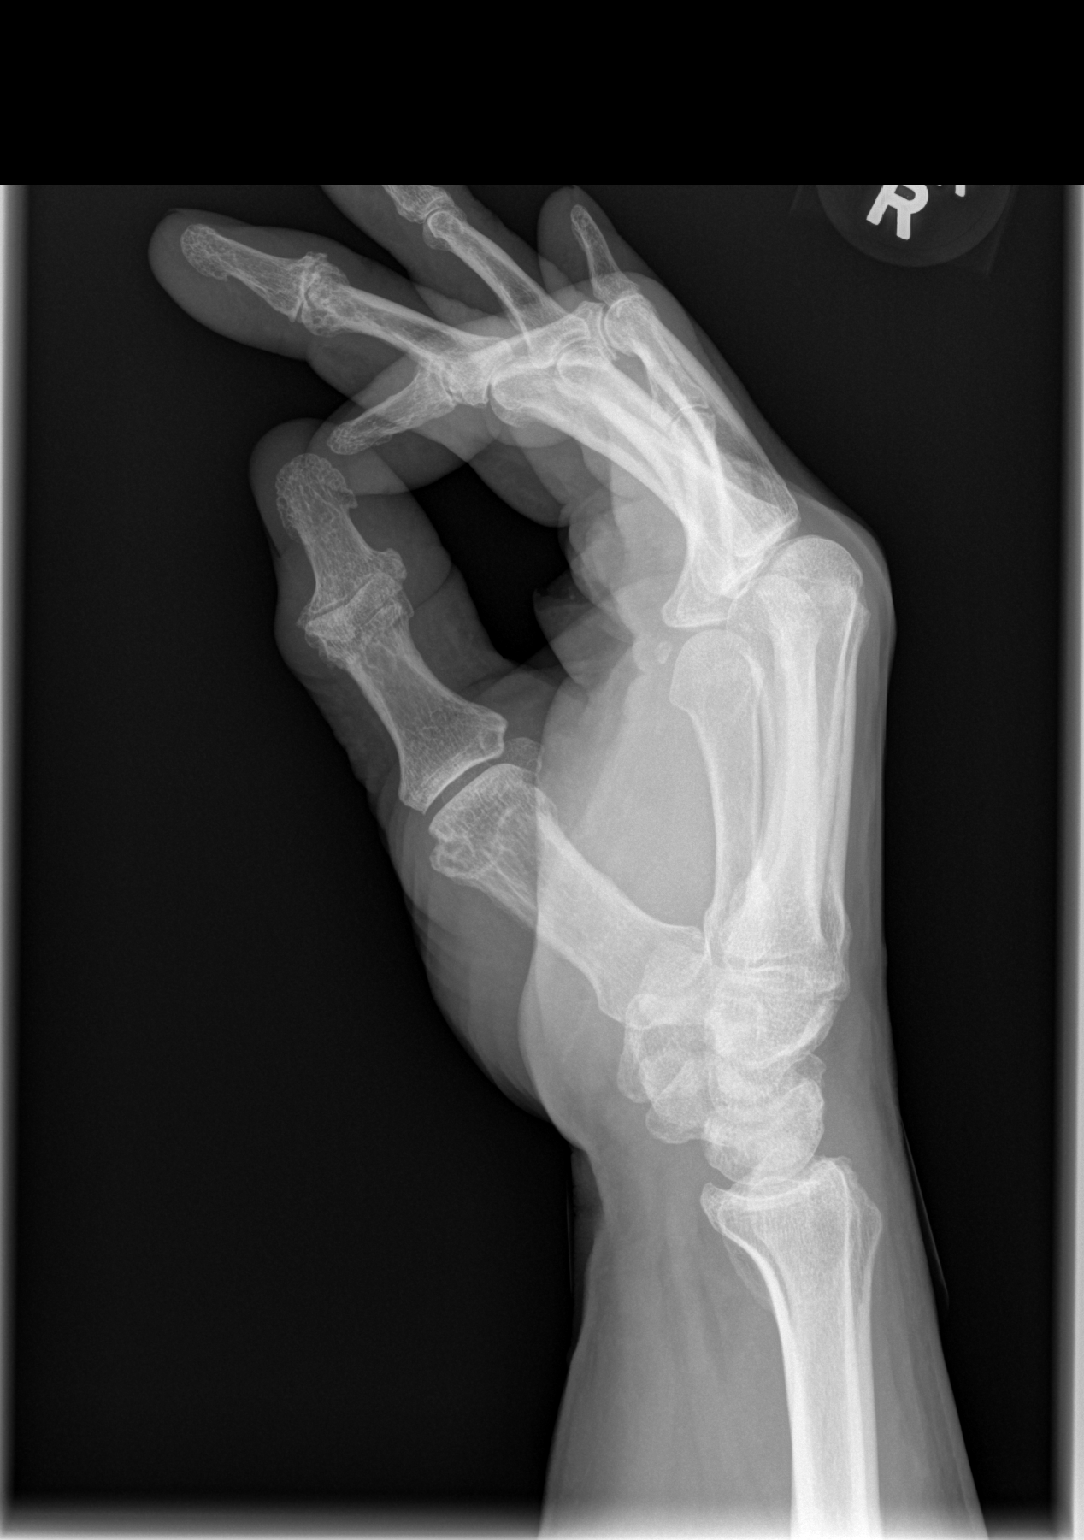

[3 of 3 positions shown; findings below may reference images not displayed]

FINDINGS: There is no acute fracture or dislocation. The bones are mildly
osteopenic. There is degenerative changes of the interphalangeal
joints. The soft tissues are grossly unremarkable.
IMPRESSION: No acute fracture or dislocation.

## 2022-02-18 ENCOUNTER — Other Ambulatory Visit: Payer: Self-pay | Admitting: Interventional Cardiology

## 2022-02-21 ENCOUNTER — Other Ambulatory Visit: Payer: Self-pay | Admitting: Interventional Cardiology

## 2022-02-25 ENCOUNTER — Other Ambulatory Visit: Payer: Self-pay | Admitting: Interventional Cardiology

## 2022-03-21 ENCOUNTER — Other Ambulatory Visit: Payer: Self-pay | Admitting: Interventional Cardiology

## 2022-03-21 LAB — LIPID PANEL
Cholesterol: 93 (ref 0–200)
HDL: 31 — AB (ref 35–70)
LDL Cholesterol: 44
LDl/HDL Ratio: 3
Triglycerides: 95 (ref 40–160)

## 2022-03-25 DIAGNOSIS — K31A0 Gastric intestinal metaplasia, unspecified: Secondary | ICD-10-CM | POA: Insufficient documentation

## 2022-03-25 DIAGNOSIS — Z860100 Personal history of colon polyps, unspecified: Secondary | ICD-10-CM | POA: Insufficient documentation

## 2022-03-25 DIAGNOSIS — K317 Polyp of stomach and duodenum: Secondary | ICD-10-CM | POA: Insufficient documentation

## 2022-07-09 ENCOUNTER — Other Ambulatory Visit: Payer: Self-pay | Admitting: Interventional Cardiology

## 2022-07-16 ENCOUNTER — Encounter: Payer: Self-pay | Admitting: Family Medicine

## 2022-07-16 ENCOUNTER — Ambulatory Visit: Payer: Medicare PPO | Admitting: Family Medicine

## 2022-07-16 VITALS — BP 130/84 | HR 74 | Temp 97.5°F | Ht 67.5 in | Wt 196.4 lb

## 2022-07-16 DIAGNOSIS — E119 Type 2 diabetes mellitus without complications: Secondary | ICD-10-CM

## 2022-07-16 DIAGNOSIS — I10 Essential (primary) hypertension: Secondary | ICD-10-CM

## 2022-07-16 DIAGNOSIS — I509 Heart failure, unspecified: Secondary | ICD-10-CM | POA: Diagnosis not present

## 2022-07-16 DIAGNOSIS — D649 Anemia, unspecified: Secondary | ICD-10-CM | POA: Diagnosis not present

## 2022-07-16 DIAGNOSIS — Z8719 Personal history of other diseases of the digestive system: Secondary | ICD-10-CM

## 2022-07-16 DIAGNOSIS — E785 Hyperlipidemia, unspecified: Secondary | ICD-10-CM

## 2022-07-16 DIAGNOSIS — E1169 Type 2 diabetes mellitus with other specified complication: Secondary | ICD-10-CM

## 2022-07-16 NOTE — Assessment & Plan Note (Signed)
Stable on current regimen Continue medications Follow-up in 2 weeks for restratification labs

## 2022-07-16 NOTE — Assessment & Plan Note (Signed)
Currently taking rosuvastatin

## 2022-07-16 NOTE — Progress Notes (Signed)
Assessment/Plan:  Spent 50 minutes reviewing patient's chart and discussing medical conditions Problem List Items Addressed This Visit       Cardiovascular and Mediastinum   Essential hypertension    Stable on current regimen Continue medications Follow-up in 2 weeks for restratification labs      Chronic congestive heart failure ([place])    Follows with [facility] cardiology No evidence of volume overload        Endocrine   Type 2 diabetes mellitus without complication, without long-term current use of insulin (HCC)    Currently taking metformin, glipizide, Farxiga No polyuria or polydipsia Follow-up in 2 weeks for recheck, and medication adjustment pending results      Relevant Medications   glipiZIDE (GLUCOTROL) 10 MG tablet   FARXIGA 10 MG TABS tablet   Hyperlipidemia associated with type 2 diabetes mellitus (HCC)    Currently taking rosuvastatin      Relevant Medications   glipiZIDE (GLUCOTROL) 10 MG tablet   FARXIGA 10 MG TABS tablet   Other Visit Diagnoses     Anemia, unspecified type    -  Primary   History of gastric polyp              Subjective:  HPI:  Michael Hudson is a 81 y.o. male who has Left-sided weakness; CVA (cerebral infarction); Essential hypertension; Suspected cerebrovascular accident (CVA); Type 2 diabetes mellitus without complication, without long-term current use of insulin ([place]); TIA (transient ischemic attack); Abnormal EKG; DM type 2, goal A1c below 7; Hyperlipidemia associated with type 2 diabetes mellitus ([place]); and Chronic congestive heart failure ([place]) on their problem list..   He  has a past medical history of Abnormal EKG (03/25/2016), Benign tumor of skin, CVA (cerebral infarction) (03/24/2016), Diabetes mellitus without complication ([place]), Enlarged prostate, Hypercholesterolemia, Hyperlipidemia, Hypertension, Hypertrophy of prostate, Hypokalemia (03/25/2016), Left-sided weakness (03/24/2016), LVH (left ventricular hypertrophy),  Primary osteoarthritis involving multiple joints, TIA (transient ischemic attack) (03/24/2016), and Type 2 diabetes mellitus without complication, without long-term current use of insulin ([place]).Marland Kitchen   He presents with chief complaint of Establish Care (Scheduled to have Endoscopy on 08/17/2022. ) .    Patient here to establish care.  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  He practice general wall including Nurse, adult.  It was a child for several years.  Most recently retired from judicial services in 2022. Patient declines any refills of medications.  DIABETES Type II, established problem,  Medications: Farxiga, glipizide, metformin, Reports taking and tolerating without side effects. Blood Sugars per patient: Ranging from 120s to 210   ROS: Denies Polyuria,Polydipsia, Denies  Hypoglycemia symptoms (palpitations, tremors, anxiousness)   Hypertension, established problem,Stable BP Readings from Last 3 Encounters:  07/16/22 130/84  08/26/21 140/72  02/10/21 128/68    Current Medications: Losartan, metoprolol, compliant without side effects. Interim History:   ROS: Denies any chest pain, shortness of breath, dyspnea on exertion, leg edema.   Iron deficiency anemia.  Currently taking iron supplements and vitamin C.   Denies symptoms of fatigue, vomiting, hematochezia, melena.  History of gastric and colonic polyps.  Also associated with erosive gastritis.  Patient with history of H. pylori status posttreatment with quadruple therapy.  Denies any abdominal pain.  [facility].  CHF.  Patient with history of congestive heart failure.  Echocardiogram in 2017 showed EF of 65  to 70%.  Patient follows with [facility]ology.    Lab Results  Component Value Date   HGBA1C 8.2 09/26/2021   Lab Results   Component Value Date   NA 141 09/26/2021   K 4.1 09/26/2021   CO2 26 (A) 09/26/2021   GLUCOSE 121 (H) 07/03/2021   BUN 14 09/26/2021   CREATININE 1.6 (A) 09/26/2021   CALCIUM 9.4 09/26/2021   EGFR 39 (L) 07/03/2021   GFRNONAA 42 (L) 08/07/2020   Last CBC Lab Results  Component Value Date   WBC 5.3 09/26/2021   HGB 11.7 (A) 09/26/2021   HCT 36 (A) 09/26/2021   MCV 84.0 03/26/2016   MCH 28.0 03/26/2016   RDW 13.7 03/26/2016   PLT 245 09/26/2021   Lab Results  Component Value Date   IRON 47 06/17/2019   FERRITIN 20 09/26/2021      History reviewed. No pertinent surgical history.  Outpatient Medications Prior to Visit  Medication Sig Dispense Refill   amLODipine (NORVASC) 5 MG tablet TAKE 1.5 TABLETS BY MOUTH DAILY. 135 tablet 2   aspirin EC 81 MG tablet Take 81 mg by mouth daily.     Cyanocobalamin 2500 MCG CHEW Chew 2,500 mcg by mouth daily.     FARXIGA 10 MG TABS tablet Take 10 mg by mouth every morning.     Ferrous Sulfate (IRON) 325 (65 Fe) MG TABS Take 1 tablet by mouth daily.     furosemide (LASIX) 20 MG tablet Take 1 tablet (20 mg total) by mouth daily. Pt needs to make appt with provider by Nov to get further refills 90 tablet 0   glipiZIDE (GLUCOTROL) 10 MG tablet Take 10 mg by mouth 2 (two) times daily.     losartan (COZAAR) 100 MG tablet Take 100 mg by mouth daily.     metFORMIN (GLUMETZA) 1000 MG (MOD) 24 hr tablet Take 1,000 mg by mouth 2 (two) times daily with a meal.      metoprolol succinate (TOPROL-XL) 100 MG 24 hr tablet Take 150 mg by mouth daily. Take with or immediately following a meal.     potassium chloride (KLOR-CON) 10 MEQ tablet TAKE 2 TABLETS BY MOUTH DAILY 180 tablet 1   rosuvastatin (CRESTOR) 20 MG tablet TAKE 1 TABLET (20 MG TOTAL) BY MOUTH DAILY. PLEASE KEEP UPCOMING APPOINTMENT IN MAY 2022 FOR FUTURE REFILLS. THANK YOU 90 tablet 2   FARXIGA 5 MG TABS tablet TAKE 1 TABLET BY MOUTH DAILY BEFORE BREAKFAST. 90 tablet 1   No  facility-administered medications prior to visit.    Family History  Problem Relation Age of Onset   Other Mother        natural cases   Other Father        hx unkown   Healthy Sister    Healthy Brother    Healthy Brother     Social History   Socioeconomic History   Marital status: Married    Spouse name: Not on file   Number of children: Not on file   Years of education: Not on file   Highest education level: Not on file  Occupational History   Not on file  Tobacco Use   Smoking status: Never    Passive exposure: Never   Smokeless tobacco: Never  Vaping Use   Vaping Use: Never used  Substance and Sexual Activity   Alcohol use: No   Drug use: No   Sexual activity: Not on file    Comment: married  Other  Topics Concern   Not on file  Social History Narrative   Not on file   Social Determinants of Health   Financial Resource Strain: Not on file  Food Insecurity: Not on file  Transportation Needs: Not on file  Physical Activity: Not on file  Stress: Not on file  Social Connections: Not on file  Intimate Partner Violence: Not on file                                                                                                 Objective:  Physical Exam: BP 130/84 (BP Location: Left Arm, Patient Position: Sitting, Cuff Size: Large)   Pulse 74   Temp (!) 97.5 F (36.4 C) (Temporal)   Ht 5' 7.5" (1.715 m)   Wt 196 lb 6.4 oz (89.1 kg)   SpO2 97%   BMI 30.31 kg/m    General: No acute distress. Awake and conversant.  Eyes: Normal conjunctiva, anicteric. Round symmetric pupils.  ENT: Hearing grossly intact. No nasal discharge.  Neck: Neck is supple. No masses or thyromegaly.  Respiratory: Respirations are non-labored. No auditory wheezing.  Skin: Warm. No rashes or ulcers.  Psych: Alert and oriented. Cooperative, Appropriate mood and affect, Normal judgment.  CV: No cyanosis or JVD MSK: Normal ambulation. No clubbing  Neuro: Sensation and CN II-XII  grossly normal.        Alesia Banda, MD, MS

## 2022-07-16 NOTE — Assessment & Plan Note (Signed)
Follows with [facility] cardiology No evidence of volume overload

## 2022-07-16 NOTE — Assessment & Plan Note (Signed)
Currently taking metformin, glipizide, Farxiga No polyuria or polydipsia Follow-up in 2 weeks for recheck, and medication adjustment pending results

## 2022-07-16 NOTE — Patient Instructions (Signed)
   It was a pleasure to meet you today.  

## 2022-07-27 ENCOUNTER — Other Ambulatory Visit: Payer: Self-pay

## 2022-07-27 ENCOUNTER — Other Ambulatory Visit (INDEPENDENT_AMBULATORY_CARE_PROVIDER_SITE_OTHER): Payer: Medicare PPO

## 2022-07-27 DIAGNOSIS — E1169 Type 2 diabetes mellitus with other specified complication: Secondary | ICD-10-CM

## 2022-07-27 DIAGNOSIS — E119 Type 2 diabetes mellitus without complications: Secondary | ICD-10-CM

## 2022-07-27 DIAGNOSIS — I10 Essential (primary) hypertension: Secondary | ICD-10-CM

## 2022-07-27 DIAGNOSIS — E785 Hyperlipidemia, unspecified: Secondary | ICD-10-CM | POA: Diagnosis not present

## 2022-07-27 LAB — CBC WITH DIFFERENTIAL/PLATELET
Basophils Absolute: 0 10*3/uL (ref 0.0–0.1)
Basophils Relative: 0.6 % (ref 0.0–3.0)
Eosinophils Absolute: 0.4 10*3/uL (ref 0.0–0.7)
Eosinophils Relative: 7.1 % — ABNORMAL HIGH (ref 0.0–5.0)
HCT: 38.3 % — ABNORMAL LOW (ref 39.0–52.0)
Hemoglobin: 12.4 g/dL — ABNORMAL LOW (ref 13.0–17.0)
Lymphocytes Relative: 33.3 % (ref 12.0–46.0)
Lymphs Abs: 2.1 10*3/uL (ref 0.7–4.0)
MCHC: 32.5 g/dL (ref 30.0–36.0)
MCV: 86.6 fl (ref 78.0–100.0)
Monocytes Absolute: 0.4 10*3/uL (ref 0.1–1.0)
Monocytes Relative: 6.8 % (ref 3.0–12.0)
Neutro Abs: 3.3 10*3/uL (ref 1.4–7.7)
Neutrophils Relative %: 52.2 % (ref 43.0–77.0)
Platelets: 240 10*3/uL (ref 150.0–400.0)
RBC: 4.43 Mil/uL (ref 4.22–5.81)
RDW: 15.3 % (ref 11.5–15.5)
WBC: 6.2 10*3/uL (ref 4.0–10.5)

## 2022-07-27 LAB — COMPREHENSIVE METABOLIC PANEL
ALT: 21 U/L (ref 0–53)
AST: 17 U/L (ref 0–37)
Albumin: 4.4 g/dL (ref 3.5–5.2)
Alkaline Phosphatase: 69 U/L (ref 39–117)
BUN: 17 mg/dL (ref 6–23)
CO2: 25 mEq/L (ref 19–32)
Calcium: 9 mg/dL (ref 8.4–10.5)
Chloride: 104 mEq/L (ref 96–112)
Creatinine, Ser: 1.62 mg/dL — ABNORMAL HIGH (ref 0.40–1.50)
GFR: 39.74 mL/min — ABNORMAL LOW (ref >60.00)
Glucose, Bld: 124 mg/dL — ABNORMAL HIGH (ref 70–99)
Potassium: 3.6 mEq/L (ref 3.5–5.1)
Sodium: 138 mEq/L (ref 135–145)
Total Bilirubin: 0.5 mg/dL (ref 0.2–1.2)
Total Protein: 7.2 g/dL (ref 6.0–8.3)

## 2022-07-27 LAB — BASIC METABOLIC PANEL
BUN: 17 mg/dL (ref 6–23)
CO2: 25 mEq/L (ref 19–32)
Calcium: 9 mg/dL (ref 8.4–10.5)
Chloride: 104 mEq/L (ref 96–112)
Creatinine, Ser: 1.62 mg/dL — ABNORMAL HIGH (ref 0.40–1.50)
GFR: 39.74 mL/min — ABNORMAL LOW (ref >60.00)
Glucose, Bld: 124 mg/dL — ABNORMAL HIGH (ref 70–99)
Potassium: 3.6 mEq/L (ref 3.5–5.1)
Sodium: 138 mEq/L (ref 135–145)

## 2022-07-27 LAB — LIPID PANEL
Cholesterol: 104 mg/dL (ref 0–200)
HDL: 36 mg/dL — ABNORMAL LOW (ref >39.00)
LDL Cholesterol: 46 mg/dL (ref 0–99)
NonHDL: 67.95
Total CHOL/HDL Ratio: 3
Triglycerides: 110 mg/dL (ref 0.0–149.0)
VLDL: 22 mg/dL (ref 0.0–40.0)

## 2022-07-27 LAB — HEMOGLOBIN A1C: Hgb A1c MFr Bld: 7.6 % — ABNORMAL HIGH (ref 4.6–6.5)

## 2022-07-27 NOTE — Progress Notes (Signed)
Patient arrived with no future labs ordered. Ordered CBC, CMP, BMP, LIPID AND A1C from recommendations of AVS from previous visit.

## 2022-07-28 ENCOUNTER — Ambulatory Visit: Payer: Medicare PPO | Admitting: Family Medicine

## 2022-08-14 ENCOUNTER — Telehealth: Payer: Self-pay | Admitting: Interventional Cardiology

## 2022-08-14 ENCOUNTER — Telehealth: Payer: Self-pay

## 2022-08-14 MED ORDER — FUROSEMIDE 20 MG PO TABS: 20.0000 mg | ORAL_TABLET | Freq: Every day | ORAL | 0 refills | Status: DC

## 2022-08-14 MED ORDER — METFORMIN HCL ER (MOD) 1000 MG PO TB24: 1000.0000 mg | ORAL_TABLET | Freq: Two times a day (BID) | ORAL | 0 refills | Status: DC

## 2022-08-14 MED ORDER — ROSUVASTATIN CALCIUM 20 MG PO TABS: 20.0000 mg | ORAL_TABLET | Freq: Every day | ORAL | 0 refills | Status: DC

## 2022-08-14 MED ORDER — LOSARTAN POTASSIUM 100 MG PO TABS: 100.0000 mg | ORAL_TABLET | Freq: Every day | ORAL | 0 refills | Status: DC

## 2022-08-14 MED ORDER — AMLODIPINE BESYLATE 5 MG PO TABS: ORAL_TABLET | ORAL | 0 refills | Status: DC

## 2022-08-14 MED ORDER — METOPROLOL SUCCINATE ER 100 MG PO TB24: 150.0000 mg | ORAL_TABLET | Freq: Every day | ORAL | 0 refills | Status: DC

## 2022-08-14 NOTE — Telephone Encounter (Signed)
Pt scheduled to see Ambrose Pancoast, NP, in December.  [facility]'s [facility].

## 2022-08-14 NOTE — Telephone Encounter (Signed)
*  STAT* If patient is at the pharmacy, call can be transferred to refill team.   1. Which medications need to be refilled? (please list name of each medication and dose if known)   furosemide (LASIX) 20 MG tablet    amLODipine (NORVASC) 5 MG tablet  rosuvastatin (CRESTOR) 20 MG tablet   2. Which pharmacy/location (including street and city if local pharmacy) is medication to be sent to?   [id number] [place], [place] - [address] AT [address]    3. Do they need a 30 day or 90 day supply? 90   Pt has an appt on 08/28/22 with EJaquelyn Bitter NP.

## 2022-08-14 NOTE — Telephone Encounter (Addendum)
Pts wife called requesting refills for medications Dr Grandville Silos has not prescribed. Pt had a visit on 10/19/203. Pt needs metFORMIN (GLUMETZA) 1000 MG   metoprolol succinate (TOPROL-XL) 100 MG 24  hr tablet   losartan (COZAAR) 100 MG tablet   [facility] [address]  I let patient know Dr Grandville Silos is out today. Pt has enough medication for the weekend. I also advised patient may need to schedule an appt for these refills

## 2022-08-14 NOTE — Telephone Encounter (Signed)
Chart supports rx. Last OV: 07/16/2022 Next OV: 10/29/2022  Contacted patient's wife and advised her [facility] [address]. She verbalized understanding.

## 2022-08-17 ENCOUNTER — Telehealth: Payer: Self-pay

## 2022-08-17 DIAGNOSIS — E119 Type 2 diabetes mellitus without complications: Secondary | ICD-10-CM

## 2022-08-17 HISTORY — PX: UPPER GI ENDOSCOPY: SHX6162

## 2022-08-17 MED ORDER — METFORMIN HCL ER (OSM) 1000 MG PO TB24: 1000.0000 mg | ORAL_TABLET | Freq: Two times a day (BID) | ORAL | 2 refills | Status: DC

## 2022-08-17 NOTE — Addendum Note (Signed)
Addended by: Josephine Igo B on: 08/17/2022 04:54 PM   Modules accepted: Orders

## 2022-08-17 NOTE — Telephone Encounter (Signed)
Please advise. Patient's wife, Olin Hauser, called in and stated that she spoke with pharmacy about metformin that was refilled on 08/14/2022 and was informed that it would cost $300.0 for rx. She inquired it was the same as the XR or if there was any changes from the metformin the patient was previously on?

## 2022-08-18 ENCOUNTER — Telehealth: Payer: Self-pay | Admitting: Family Medicine

## 2022-08-18 NOTE — Telephone Encounter (Signed)
Bryson Ha that medication was sent out to pharmacy at 4:54pm yesterday.

## 2022-08-18 NOTE — Telephone Encounter (Signed)
Left patient a detailed voice message advising them that I spoke with the pharmacy and the pharmacist canceled the rx from 08/14/2022 and the new metformin rx crossed over. The rx should be ready for pick up today.

## 2022-08-18 NOTE — Telephone Encounter (Signed)
Caller Name: pt wife Call back phone #: [phone]   MEDICATION(S):  metformin (FORTAMET) 1000 MG (OSM) 24 hr tablet [[id number]]   Days of Med Remaining:   Has the patient contacted their pharmacy (YES/NO)? no What did pharmacy advise?   [facility]erred Pharmacy:  [id number] [place], [place] - [address]: [phone]     ~~~Please advise patient/caregiver to allow 2-3 business days to process RX refills.

## 2022-08-18 NOTE — Telephone Encounter (Signed)
Pt was told this by [id number] [place], [place] - [id number]: [phone] Fax: [phone] That they did not have this script. I let her know I could see    E-Prescribing Status: Receipt confirmed by pharmacy (08/17/2022  4:54 PM EST)

## 2022-08-24 ENCOUNTER — Other Ambulatory Visit: Payer: Self-pay | Admitting: Family Medicine

## 2022-08-25 ENCOUNTER — Ambulatory Visit: Payer: Medicare PPO

## 2022-08-27 NOTE — Progress Notes (Signed)
Office Visit    Patient Name: Michael Hudson Date of Encounter: 08/28/2022  Primary Care Provider:  Bonnita Hollow, MD Primary Cardiologist:  None Primary Electrophysiologist: None  Chief Complaint    Michael Hudson is a 81 y.o. male with PMH of DM type II HTN, HLD, CKD stage III, HFpEF, CVA (2017) who presents today for 1 year follow-up.  Past Medical History    Past Medical History:  Diagnosis Date   Abnormal EKG 03/25/2016   Benign tumor of skin    CVA (cerebral infarction) 03/24/2016   Diabetes mellitus without complication (HCC)    Enlarged prostate    Hypercholesterolemia    Hyperlipidemia    Hypertension    Hypertrophy of prostate    with urinary obstruction and other lower urinary tract   Hypokalemia 03/25/2016   Left-sided weakness 03/24/2016   LVH (left ventricular hypertrophy)    Primary osteoarthritis involving multiple joints    TIA (transient ischemic attack) 03/24/2016   Type 2 diabetes mellitus without complication, without long-term current use of insulin (HCC)    Past Surgical History:  Procedure Laterality Date   UPPER GI ENDOSCOPY  08/17/2022   See scanned document    Allergies  Allergies  Allergen Reactions   Shellfish Allergy Nausea And Vomiting   Neosporin [Neomycin-Bacitracin Zn-Polymyx] Rash   Zinc Rash    History of Present Illness    Michael Hudson  is a 81 year old male with the above mention past medical history who presents today for 1 year follow-up of coronary artery disease.  [name] was initially seen by Dr. Tamala Julian in 2019 for complaint of irregular heartbeat.  He is EKG revealed LVH and patient noted increased heart rate of 125 bpm.  He was noted to become near syncopal while attending a graduation at [employer].  He was also found to have poor blood pressure control and was started on amlodipine 5 mg and HCTZ 12.5 mg.  2D echo completed 02/2016 with EF of 60-65%, LVH, mild AV regurg, mild MV regurg and moderately dilated LA.   He was seen in 2019 and Aldactone 12.5 mg was added to blood pressure regimen and amlodipine was increased to 10 mg.  He underwent renal ultrasound to rule out renal artery stenosis that were both normal.  He was started on SGLT2 inhibitor for vascular and renal protection.  He was last seen by Dr. Tamala Julian 07/2021 for follow-up.  During his visit he had no complaints and denied any chest pain or shortness of breath.  He was euvolemic on exam and new blood pressure goal was 140/80  [name] resents today with his wife for 1 year follow-up.  Since last being seen in the office patient reports he has been doing well with no new cardiac complaints.  He is compliant with his current medications and denies any adverse reactions.  His blood pressure today was well-controlled at 122/70 and heart rate was 71 bpm.  He is euvolemic on exam today and abstains from excess salt in his diet.  He is active and exercises 4 times a week at a local fitness center near his home.  During our visit we discussed the importance of primary and secondary prevention and preventing the progression of coronary artery disease.  Patient denies chest pain, palpitations, dyspnea, PND, orthopnea, nausea, vomiting, dizziness, syncope, edema, weight gain, or early satiety.  Home Medications    Current Outpatient Medications  Medication Sig Dispense Refill   amLODipine (  NORVASC) 5 MG tablet TAKE 1.5 TABLETS BY MOUTH DAILY. 135 tablet 0   Ascorbic Acid (VITAMIN C PO) Take by mouth. daily     aspirin EC 81 MG tablet Take 81 mg by mouth daily.     Cyanocobalamin 2500 MCG CHEW Chew 2,500 mcg by mouth daily.     FARXIGA 10 MG TABS tablet Take 10 mg by mouth every morning.     Ferrous Sulfate (IRON) 325 (65 Fe) MG TABS Take 1 tablet by mouth daily.     furosemide (LASIX) 20 MG tablet Take 1 tablet (20 mg total) by mouth daily. Pt needs to make appt with provider by Nov to get further refills 90 tablet 0   glipiZIDE (GLUCOTROL) 10 MG tablet  Take 10 mg by mouth 2 (two) times daily.     losartan (COZAAR) 100 MG tablet Take 1 tablet (100 mg total) by mouth daily. 90 tablet 0   metformin (FORTAMET) 1000 MG (OSM) 24 hr tablet Take 1 tablet (1,000 mg total) by mouth 2 (two) times daily with a meal. 60 tablet 2   metoprolol succinate (TOPROL-XL) 100 MG 24 hr tablet TAKE 1.5 TABLETS (150 MG TOTAL) BY MOUTH DAILY. TAKE WITH OR IMMEDIATELY FOLLOWING A MEAL. 135 tablet 0   potassium chloride (KLOR-CON) 10 MEQ tablet TAKE 2 TABLETS BY MOUTH DAILY 180 tablet 1   rosuvastatin (CRESTOR) 20 MG tablet Take 1 tablet (20 mg total) by mouth daily. Please keep upcoming appointment in May 2022 for future refills. Thank you 90 tablet 0   No current facility-administered medications for this visit.     Review of Systems  Please see the history of present illness.      All other systems reviewed and are otherwise negative except as noted above.  Physical Exam    Wt Readings from Last 3 Encounters:  08/28/22 193 lb (87.5 kg)  07/16/22 196 lb 6.4 oz (89.1 kg)  08/26/21 191 lb 8 oz (86.9 kg)   VS: Vitals:   08/28/22 1529  BP: 122/70  Pulse: 71  SpO2: 91%  ,Body mass index is 29.78 kg/m.  Constitutional:      Appearance: Healthy appearance. Not in distress.  Neck:     Vascular: JVD normal.  Pulmonary:     Effort: Pulmonary effort is normal.     Breath sounds: No wheezing. No rales. Diminished in the bases Cardiovascular:     Normal rate. Regular rhythm. Normal S1. Normal S2.      Murmurs: There is no murmur.  Edema:    Peripheral edema absent.  Abdominal:     Palpations: Abdomen is soft non tender. There is no hepatomegaly.  Skin:    General: Skin is warm and dry.  Neurological:     General: No focal deficit present.     Mental Status: Alert and oriented to person, place and time.     Cranial Nerves: Cranial nerves are intact.  EKG/LABS/Other Studies Reviewed    ECG personally reviewed by me today -sinus rhythm with rate of 71  bpm and TWI in leads V4, V5, V6 consistent with previous EKG   Lab Results  Component Value Date   WBC 6.2 07/27/2022   HGB 12.4 (L) 07/27/2022   HCT 38.3 (L) 07/27/2022   MCV 86.6 07/27/2022   PLT 240.0 07/27/2022   Lab Results  Component Value Date   CREATININE 1.62 (H) 07/27/2022   CREATININE 1.62 (H) 07/27/2022   BUN 17 07/27/2022   BUN 17 07/27/2022  NA 138 07/27/2022   NA 138 07/27/2022   K 3.6 07/27/2022   K 3.6 07/27/2022   CL 104 07/27/2022   CL 104 07/27/2022   CO2 25 07/27/2022   CO2 25 07/27/2022   Lab Results  Component Value Date   ALT 21 07/27/2022   AST 17 07/27/2022   ALKPHOS 69 07/27/2022   BILITOT 0.5 07/27/2022   Lab Results  Component Value Date   CHOL 104 07/27/2022   HDL 36.00 (L) 07/27/2022   LDLCALC 46 07/27/2022   TRIG 110.0 07/27/2022   CHOLHDL 3 07/27/2022    Lab Results  Component Value Date   HGBA1C 7.6 (H) 07/27/2022    Assessment & Plan    1.  Essential hypertension: -Patient's blood pressure today was well-controlled at 122/70 -Continue current regimen of Norvasc 5 mg, losartan 100 mg, and Toprol 1.5 mg daily -Patient encouraged to abstain from excess salt in his diet and continue to participate in 150 minutes of physical activity per week.  2.  HFpEF: -02/2016 with EF of 60-65%, LVH, mild AV regurg, mild MV regurg and moderately dilated LA. -Today patient is euvolemic on exam today -Continue Toprol-XL 150 mg daily and Lasix 20 mg daily -Low sodium diet, fluid restriction <2L, and daily weights encouraged. Educated to contact our office for weight gain of 2 lbs overnight or 5 lbs in one week.   3.  History of CVA: -Patient reports no residual effects -Continue Crestor 20 mg ASA 81 mg  4.  CKD stage III: -Most recent creatinine was 1.62 -Patient was offered referral to nephrology and politely declines at this time  5.  DM type II: -Patient's last hemoglobin A1c was 7.1 -Continue Farxiga 10 mg -Continue glipizide and  metformin per PCP  Disposition: Follow-up with None or APP in 12 months    Medication Adjustments/Labs and Tests Ordered: Current medicines are reviewed at length with the patient today.  Concerns regarding medicines are outlined above.   Signed, Mable Fill, Marissa Nestle, NP 08/28/2022, 4:32 PM [facility]  Note:  This document was prepared using Dragon voice recognition software and may include unintentional dictation errors.

## 2022-08-28 ENCOUNTER — Ambulatory Visit: Payer: Medicare PPO | Attending: Nurse Practitioner | Admitting: Nurse Practitioner

## 2022-08-28 ENCOUNTER — Encounter: Payer: Self-pay | Admitting: Nurse Practitioner

## 2022-08-28 VITALS — BP 122/70 | HR 71 | Ht 67.5 in | Wt 193.0 lb

## 2022-08-28 DIAGNOSIS — I5032 Chronic diastolic (congestive) heart failure: Secondary | ICD-10-CM | POA: Diagnosis not present

## 2022-08-28 DIAGNOSIS — N1832 Chronic kidney disease, stage 3b: Secondary | ICD-10-CM

## 2022-08-28 DIAGNOSIS — E119 Type 2 diabetes mellitus without complications: Secondary | ICD-10-CM | POA: Diagnosis not present

## 2022-08-28 DIAGNOSIS — I10 Essential (primary) hypertension: Secondary | ICD-10-CM | POA: Diagnosis not present

## 2022-08-28 DIAGNOSIS — G459 Transient cerebral ischemic attack, unspecified: Secondary | ICD-10-CM

## 2022-08-28 NOTE — Patient Instructions (Signed)
Medication Instructions:  Your physician recommends that you continue on your current medications as directed. Please refer to the Current Medication list given to you today. *If you need a refill on your cardiac medications before your next appointment, please call your pharmacy*   Lab Work: None ordered   Testing/Procedures: None ordered   Follow-Up: At [facility], you and your health needs are our priority.  As part of our continuing mission to provide you with exceptional heart care, we have created designated Provider Care Teams.  These Care Teams include your primary Cardiologist (physician) and Advanced Practice Providers (APPs -  Physician Assistants and Nurse Practitioners) who all work together to provide you with the care you need, when you need it.  We recommend signing up for the patient portal called "MyChart".  Sign up information is provided on this After Visit Summary.  MyChart is used to connect with patients for Virtual Visits (Telemedicine).  Patients are able to view lab/test results, encounter notes, upcoming appointments, etc.  Non-urgent messages can be sent to your provider as well.   To learn more about what you can do with MyChart, go to [email or web address].    Your next appointment:   12 month(s)  The format for your next appointment:   In Person  Provider:   Berniece Salines, MD Other Instructions   Important Information About Sugar

## 2022-08-31 NOTE — Addendum Note (Signed)
Addended by: Michelle Nasuti on: 08/31/2022 04:11 PM   Modules accepted: Orders

## 2022-09-01 ENCOUNTER — Ambulatory Visit (INDEPENDENT_AMBULATORY_CARE_PROVIDER_SITE_OTHER): Payer: Medicare PPO

## 2022-09-01 VITALS — BP 130/62 | HR 75 | Temp 98.3°F | Ht 67.5 in | Wt 196.2 lb

## 2022-09-01 DIAGNOSIS — Z0000 Encounter for general adult medical examination without abnormal findings: Secondary | ICD-10-CM

## 2022-09-01 NOTE — Progress Notes (Signed)
Subjective:   Michael Hudson is a 81 y.o. male who presents for an Initial Medicare Annual Wellness Visit.  Review of Systems     Cardiac Risk Factors include: advanced age (>46mn, >>75women);dyslipidemia;diabetes mellitus;hypertension;male gender;obesity (BMI >30kg/m2)     Objective:    Today's Vitals   09/01/22 1331  BP: 130/62  Pulse: 75  Temp: 98.3 F (36.8 C)  TempSrc: Oral  SpO2: 97%  Weight: 196 lb 3.2 oz (89 kg)  Height: 5' 7.5" (1.715 m)   Body mass index is 30.28 kg/m.     09/01/2022    1:42 PM 03/16/2020    8:25 PM 03/24/2016    9:05 PM  Advanced Directives  Does Patient Have a Medical Advance Directive? No No No  Would patient like information on creating a medical advance directive? No - Patient declined  No - patient declined information    Current Medications (verified) Outpatient Encounter Medications as of 09/01/2022  Medication Sig   amLODipine (NORVASC) 5 MG tablet TAKE 1.5 TABLETS BY MOUTH DAILY.   Ascorbic Acid (VITAMIN C PO) Take by mouth. daily   aspirin EC 81 MG tablet Take 81 mg by mouth daily.   Cyanocobalamin 2500 MCG CHEW Chew 2,500 mcg by mouth daily.   FARXIGA 10 MG TABS tablet Take 10 mg by mouth every morning.   Ferrous Sulfate (IRON) 325 (65 Fe) MG TABS Take 1 tablet by mouth daily.   furosemide (LASIX) 20 MG tablet Take 1 tablet (20 mg total) by mouth daily. Pt needs to make appt with provider by Nov to get further refills   glipiZIDE (GLUCOTROL) 10 MG tablet Take 10 mg by mouth 2 (two) times daily.   losartan (COZAAR) 100 MG tablet Take 1 tablet (100 mg total) by mouth daily.   metformin (FORTAMET) 1000 MG (OSM) 24 hr tablet Take 1 tablet (1,000 mg total) by mouth 2 (two) times daily with a meal.   metoprolol succinate (TOPROL-XL) 100 MG 24 hr tablet TAKE 1.5 TABLETS (150 MG TOTAL) BY MOUTH DAILY. TAKE WITH OR IMMEDIATELY FOLLOWING A MEAL.   potassium chloride (KLOR-CON) 10 MEQ tablet TAKE 2 TABLETS BY MOUTH DAILY   rosuvastatin  (CRESTOR) 20 MG tablet Take 1 tablet (20 mg total) by mouth daily. Please keep upcoming appointment in May 2022 for future refills. Thank you   No facility-administered encounter medications on file as of 09/01/2022.    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   Diabetes mellitus without complication (HCC)    Enlarged prostate    Hypercholesterolemia    Hyperlipidemia    Hypertension    Hypertrophy of prostate    with urinary obstruction and other lower urinary tract   Hypokalemia 03/25/2016   Left-sided weakness 03/24/2016   LVH (left ventricular hypertrophy)    Primary osteoarthritis involving multiple joints    TIA (transient ischemic attack) 03/24/2016   Type 2 diabetes mellitus without complication, without long-term current use of insulin (HCC)    Past Surgical History:  Procedure Laterality Date   UPPER GI ENDOSCOPY  08/17/2022   See scanned document   Family History  Problem Relation Age of Onset   Other Mother        natural cases   Other Father        hx unkown   Healthy  Sister    Healthy Brother    Healthy Brother    Social History   Socioeconomic History   Marital status: Married    Spouse name: Not on file   Number of children: Not on file   Years of education: Not on file   Highest education level: Not on file  Occupational History   Not on file  Tobacco Use   Smoking status: Never    Passive exposure: Never   Smokeless tobacco: Never  Vaping Use   Vaping Use: Never used  Substance and Sexual Activity   Alcohol use: No   Drug use: No   Sexual activity: Not on file    Comment: married  Other Topics Concern   Not on file  Social History Narrative   Not on file   Social Determinants of Health   Financial Resource Strain: Low Risk  (09/01/2022)   Overall  Financial Resource Strain (CARDIA)    Difficulty of Paying Living Expenses: Not hard at all  Food Insecurity: No Food Insecurity (09/01/2022)   Hunger Vital Sign    Worried About Running Out of Food in the Last Year: Never true    [place] in the Last Year: Never true  Transportation Needs: No Transportation Needs (09/01/2022)   PRAPARE - Hydrologist (Medical): No    Lack of Transportation (Non-Medical): No  Physical Activity: Inactive (09/01/2022)   Exercise Vital Sign    Days of Exercise per Week: 0 days    Minutes of Exercise per Session: 0 min  Stress: No Stress Concern Present (09/01/2022)   [place]    Feeling of Stress : Not at all  Social Connections: Not on file    Tobacco Counseling Counseling given: Not Answered   Clinical Intake:  Pre-visit preparation completed: Yes  Pain : No/denies pain     Nutritional Status: BMI > 30  Obese Nutritional Risks: None Diabetes: Yes  How often do you need to have someone help you when you read instructions, pamphlets, or other written materials from your doctor or pharmacy?: 1 - Never  Diabetic? Yes Nutrition Risk Assessment:  Has the patient had any N/V/D within the last 2 months?  No  Does the patient have any non-healing wounds?  No  Has the patient had any unintentional weight loss or weight gain?  No   Diabetes:  Is the patient diabetic?  Yes  If diabetic, was a CBG obtained today?  No  Did the patient bring in their glucometer from home?  No  How often do you monitor your CBG's? Three times daily.   Financial Strains and Diabetes Management:  Are you having any financial strains with the device, your supplies or your medication? No .  Does the patient want to be seen by Chronic Care Management for management of their diabetes?  No  Would the patient like to be referred to a Nutritionist or for Diabetic  Management?  No   Diabetic Exams:  Diabetic Eye Exam: Overdue for diabetic eye exam. Pt has been advised about the importance in completing this exam. Patient advised to call and schedule an eye exam. Diabetic Foot Exam: Overdue, Pt has been advised about the importance in completing this exam. Pt is scheduled for diabetic foot exam on next appointment.   Interpreter Needed?: No  Information entered by :: NAllen LPN   Activities of Daily Living    09/01/2022  1:43 PM  In your present state of health, do you have any difficulty performing the following activities:  Hearing? 0  Vision? 0  Difficulty concentrating or making decisions? 0  Walking or climbing stairs? 0  Dressing or bathing? 0  Doing errands, shopping? 0  Preparing Food and eating ? N  Using the Toilet? N  In the past six months, have you accidently leaked urine? N  Do you have problems with loss of bowel control? N  Managing your Medications? Y  Managing your Finances? N  Housekeeping or managing your Housekeeping? N    Patient Care Team: Bonnita Hollow, MD as PCP - General (Family Medicine) Berniece Salines, DO as Consulting Physician (Cardiology)  Indicate any recent Medical Services you may have received from other than [facility] providers in the past year (date may be approximate).     Assessment:   This is a routine wellness examination for Michael Hudson.  Hearing/Vision screen Vision Screening - Comments:: No regular eye exams,   Dietary issues and exercise activities discussed: Current Exercise Habits: The patient does not participate in regular exercise at present   Goals Addressed             This Visit's Progress    Patient Stated       09/01/2022, wants to stay well       Depression Screen    09/01/2022    1:43 PM  PHQ 2/9 Scores  PHQ - 2 Score 0    Fall Risk    09/01/2022    1:43 PM  [place] in the past year? 0  Number falls in past yr: 0  Injury with Fall? 0  Risk for  fall due to : Medication side effect  Follow up Falls prevention discussed;Education provided;Falls evaluation completed    FALL RISK PREVENTION PERTAINING TO THE HOME:  Any stairs in or around the home? Yes  If so, are there any without handrails? No  Home free of loose throw rugs in walkways, pet beds, electrical cords, etc? Yes  Adequate lighting in your home to reduce risk of falls? Yes   ASSISTIVE DEVICES UTILIZED TO PREVENT FALLS:  Life alert? No  Use of a cane, walker or w/c? No  Grab bars in the bathroom? No  Shower chair or bench in shower? Yes  Elevated toilet seat or a handicapped toilet? Yes   TIMED UP AND GO:  Was the test performed? Yes .  Length of time to ambulate 10 feet: 8 sec.   Gait slow and steady without use of assistive device  Cognitive Function:        09/01/2022    1:44 PM  6CIT Screen  What Year? 0 points  What month? 0 points  What time? 0 points  Count back from 20 0 points  Months in reverse 0 points  Repeat phrase 2 points  Total Score 2 points    Immunizations Immunization History  Administered Date(s) Administered   Fluad Quad(high Dose 65+) 07/29/2019   Influenza, High Dose Seasonal PF 07/28/2018   PFIZER Comirnaty(Gray Top)Covid-19 Tri-Sucrose Vaccine 01/07/2021   PFIZER(Purple Top)SARS-COV-2 Vaccination 07/22/2020   Tdap 03/16/2020    TDAP status: Up to date  Flu Vaccine status: Up to date  Pneumococcal vaccine status: Due, Education has been provided regarding the importance of this vaccine. Advised may receive this vaccine at local pharmacy or Health Dept. Aware to provide a copy of the vaccination record if obtained from local pharmacy  or Health Dept. Verbalized acceptance and understanding.  Covid-19 vaccine status: Completed vaccines  Qualifies for Shingles Vaccine? Yes   Zostavax completed No   Shingrix Completed?: No.    Education has been provided regarding the importance of this vaccine. Patient has been advised  to call insurance company to determine out of pocket expense if they have not yet received this vaccine. Advised may also receive vaccine at local pharmacy or Health Dept. Verbalized acceptance and understanding.  Screening Tests Health Maintenance  Topic Date Due   Medicare Annual Wellness (AWV)  Never done   FOOT EXAM  Never done   OPHTHALMOLOGY EXAM  Never done   Zoster Vaccines- Shingrix (1 of 2) Never done   Pneumonia Vaccine 25+ Years old (1 - PCV) Never done   Diabetic kidney evaluation - Urine ACR  03/21/2022   COVID-19 Vaccine (3 - 2023-24 season) 05/29/2022   INFLUENZA VACCINE  12/27/2022 (Originally 04/28/2022)   HEMOGLOBIN A1C  01/26/2023   Diabetic kidney evaluation - GFR measurement  07/28/2023   DTaP/Tdap/Td (2 - Td or Tdap) 03/16/2030   HPV VACCINES  Aged Out    Health Maintenance  Health Maintenance Due  Topic Date Due   Medicare Annual Wellness (AWV)  Never done   FOOT EXAM  Never done   OPHTHALMOLOGY EXAM  Never done   Zoster Vaccines- Shingrix (1 of 2) Never done   Pneumonia Vaccine 51+ Years old (1 - PCV) Never done   Diabetic kidney evaluation - Urine ACR  03/21/2022   COVID-19 Vaccine (3 - 2023-24 season) 05/29/2022    Colorectal cancer screening: No longer required.   Lung Cancer Screening: (Low Dose CT Chest recommended if Age 61-80 years, 30 pack-year currently smoking OR have quit w/in 15years.) does not qualify.   Lung Cancer Screening Referral: no  Additional Screening:  Hepatitis C Screening: does not qualify;   Vision Screening: Recommended annual ophthalmology exams for early detection of glaucoma and other disorders of the eye. Is the patient up to date with their annual eye exam?  No  Who is the provider or what is the name of the office in which the patient attends annual eye exams? In [facility] If pt is not established with a provider, would they like to be referred to a provider to establish care? No .   Dental Screening: Recommended  annual dental exams for proper oral hygiene  Community Resource Referral / Chronic Care Management: CRR required this visit?  No   CCM required this visit?  No      Plan:     I have personally reviewed and noted the following in the patient's chart:   Medical and social history Use of alcohol, tobacco or illicit drugs  Current medications and supplements including opioid prescriptions. Patient is not currently taking opioid prescriptions. Functional ability and status Nutritional status Physical activity Advanced directives List of other physicians Hospitalizations, surgeries, and ER visits in previous 12 months Vitals Screenings to include cognitive, depression, and falls Referrals and appointments  In addition, I have reviewed and discussed with patient certain preventive protocols, quality metrics, and best practice recommendations. A written personalized care plan for preventive services as well as general preventive health recommendations were provided to patient.     Kellie Simmering, LPN   [id number]   Nurse Notes: none

## 2022-09-01 NOTE — Patient Instructions (Addendum)
[name] , Thank you for taking time to come for your Medicare Wellness Visit. I appreciate your ongoing commitment to your health goals. Please review the following plan we discussed and let me know if I can assist you in the future.   These are the goals we discussed:  Goals      Patient Stated     09/01/2022, wants to stay well        This is a list of the screening recommended for you and due dates:  Health Maintenance  Topic Date Due   Complete foot exam   Never done   Eye exam for diabetics  Never done   Pneumonia Vaccine (1 - PCV) Never done   Yearly kidney health urinalysis for diabetes  03/21/2022   COVID-19 Vaccine (4 - 2023-24 season) 08/29/2022   Zoster (Shingles) Vaccine (1 of 2) 12/01/2022*   Hemoglobin A1C  01/26/2023   Yearly kidney function blood test for diabetes  07/28/2023   Medicare Annual Wellness Visit  09/02/2023   DTaP/Tdap/Td vaccine (2 - Td or Tdap) 03/16/2030   Flu Shot  Completed   HPV Vaccine  Aged Out  *Topic was postponed. The date shown is not the original due date.    Advanced directives: Advance directive discussed with you today. Even though you declined this today please call our office should you change your mind and we can give you the proper paperwork for you to fill out.  Conditions/risks identified: none  Next appointment: Follow up in one year for your annual wellness visit.   Preventive Care 23 Years and Older, Male  Preventive care refers to lifestyle choices and visits with your health care provider that can promote health and wellness. What does preventive care include? A yearly physical exam. This is also called an annual well check. Dental exams once or twice a year. Routine eye exams. Ask your health care provider how often you should have your eyes checked. Personal lifestyle choices, including: Daily care of your teeth and gums. Regular physical activity. Eating a healthy diet. Avoiding tobacco and drug use. Limiting  alcohol use. Practicing safe sex. Taking low doses of aspirin every day. Taking vitamin and mineral supplements as recommended by your health care provider. What happens during an annual well check? The services and screenings done by your health care provider during your annual well check will depend on your age, overall health, lifestyle risk factors, and family history of disease. Counseling  Your health care provider may ask you questions about your: Alcohol use. Tobacco use. Drug use. Emotional well-being. Home and relationship well-being. Sexual activity. Eating habits. History of falls. Memory and ability to understand (cognition). Work and work Statistician. Screening  You may have the following tests or measurements: Height, weight, and BMI. Blood pressure. Lipid and cholesterol levels. These may be checked every 5 years, or more frequently if you are over 7 years old. Skin check. Lung cancer screening. You may have this screening every year starting at age 23 if you have a 30-pack-year history of smoking and currently smoke or have quit within the past 15 years. Fecal occult blood test (FOBT) of the stool. You may have this test every year starting at age 58. Flexible sigmoidoscopy or colonoscopy. You may have a sigmoidoscopy every 5 years or a colonoscopy every 10 years starting at age 93. Prostate cancer screening. Recommendations will vary depending on your family history and other risks. Hepatitis C blood test. Hepatitis B blood test. Sexually  transmitted disease (STD) testing. Diabetes screening. This is done by checking your blood sugar (glucose) after you have not eaten for a while (fasting). You may have this done every 1-3 years. Abdominal aortic aneurysm (AAA) screening. You may need this if you are a current or former smoker. Osteoporosis. You may be screened starting at age 67 if you are at high risk. Talk with your health care provider about your test results,  treatment options, and if necessary, the need for more tests. Vaccines  Your health care provider may recommend certain vaccines, such as: Influenza vaccine. This is recommended every year. Tetanus, diphtheria, and acellular pertussis (Tdap, Td) vaccine. You may need a Td booster every 10 years. Zoster vaccine. You may need this after age 103. Pneumococcal 13-valent conjugate (PCV13) vaccine. One dose is recommended after age 69. Pneumococcal polysaccharide (PPSV23) vaccine. One dose is recommended after age 36. Talk to your health care provider about which screenings and vaccines you need and how often you need them. This information is not intended to replace advice given to you by your health care provider. Make sure you discuss any questions you have with your health care provider. Document Released: 10/11/2015 Document Revised: 06/03/2016 Document Reviewed: 07/16/2015 Elsevier Interactive Patient Education  2017 [place] Prevention in the Home Falls can cause injuries. They can happen to people of all ages. There are many things you can do to make your home safe and to help prevent falls. What can I do on the outside of my home? Regularly fix the edges of walkways and driveways and fix any cracks. Remove anything that might make you trip as you walk through a door, such as a raised step or threshold. Trim any bushes or trees on the path to your home. Use bright outdoor lighting. Clear any walking paths of anything that might make someone trip, such as rocks or tools. Regularly check to see if handrails are loose or broken. Make sure that both sides of any steps have handrails. Any raised decks and porches should have guardrails on the edges. Have any leaves, snow, or ice cleared regularly. Use sand or salt on walking paths during winter. Clean up any spills in your garage right away. This includes oil or grease spills. What can I do in the bathroom? Use night  lights. Install grab bars by the toilet and in the tub and shower. Do not use towel bars as grab bars. Use non-skid mats or decals in the tub or shower. If you need to sit down in the shower, use a plastic, non-slip stool. Keep the floor dry. Clean up any water that spills on the floor as soon as it happens. Remove soap buildup in the tub or shower regularly. Attach bath mats securely with double-sided non-slip rug tape. Do not have throw rugs and other things on the floor that can make you trip. What can I do in the bedroom? Use night lights. Make sure that you have a light by your bed that is easy to reach. Do not use any sheets or blankets that are too big for your bed. They should not hang down onto the floor. Have a firm chair that has side arms. You can use this for support while you get dressed. Do not have throw rugs and other things on the floor that can make you trip. What can I do in the kitchen? Clean up any spills right away. Avoid walking on wet floors. Keep items that you use  a lot in easy-to-reach places. If you need to reach something above you, use a strong step stool that has a grab bar. Keep electrical cords out of the way. Do not use floor polish or wax that makes floors slippery. If you must use wax, use non-skid floor wax. Do not have throw rugs and other things on the floor that can make you trip. What can I do with my stairs? Do not leave any items on the stairs. Make sure that there are handrails on both sides of the stairs and use them. Fix handrails that are broken or loose. Make sure that handrails are as long as the stairways. Check any carpeting to make sure that it is firmly attached to the stairs. Fix any carpet that is loose or worn. Avoid having throw rugs at the top or bottom of the stairs. If you do have throw rugs, attach them to the floor with carpet tape. Make sure that you have a light switch at the top of the stairs and the bottom of the stairs. If  you do not have them, ask someone to add them for you. What else can I do to help prevent falls? Wear shoes that: Do not have high heels. Have rubber bottoms. Are comfortable and fit you well. Are closed at the toe. Do not wear sandals. If you use a stepladder: Make sure that it is fully opened. Do not climb a closed stepladder. Make sure that both sides of the stepladder are locked into place. Ask someone to hold it for you, if possible. Clearly mark and make sure that you can see: Any grab bars or handrails. First and last steps. Where the edge of each step is. Use tools that help you move around (mobility aids) if they are needed. These include: Canes. Walkers. Scooters. Crutches. Turn on the lights when you go into a dark area. Replace any light bulbs as soon as they burn out. Set up your furniture so you have a clear path. Avoid moving your furniture around. If any of your floors are uneven, fix them. If there are any pets around you, be aware of where they are. Review your medicines with your doctor. Some medicines can make you feel dizzy. This can increase your chance of falling. Ask your doctor what other things that you can do to help prevent falls. This information is not intended to replace advice given to you by your health care provider. Make sure you discuss any questions you have with your health care provider. Document Released: 07/11/2009 Document Revised: 02/20/2016 Document Reviewed: 10/19/2014 Elsevier Interactive Patient Education  2017 [employer].

## 2022-09-04 ENCOUNTER — Telehealth: Payer: Self-pay | Admitting: Family Medicine

## 2022-09-04 DIAGNOSIS — E119 Type 2 diabetes mellitus without complications: Secondary | ICD-10-CM

## 2022-09-04 MED ORDER — ONETOUCH VERIO VI STRP: 1.0000 | ORAL_STRIP | Freq: Three times a day (TID) | 3 refills | Status: DC

## 2022-09-04 NOTE — Telephone Encounter (Signed)
Caller Name: Ziair Penson Call back phone #: [phone]  Reason for Call: Needs One touch verio strips sent in. They test pt 3x a day. Please send to  [facility] [address] [place], [place] [address]

## 2022-09-07 ENCOUNTER — Other Ambulatory Visit: Payer: Self-pay

## 2022-09-07 DIAGNOSIS — E119 Type 2 diabetes mellitus without complications: Secondary | ICD-10-CM

## 2022-09-07 MED ORDER — ONETOUCH VERIO VI STRP: 1.0000 | ORAL_STRIP | Freq: Three times a day (TID) | 3 refills | Status: DC

## 2022-09-07 NOTE — Telephone Encounter (Signed)
Updated pharmacy to [facility] [address]. Contacted patient's spouse, Olin Hauser, and advised her and she verbalized understanding.

## 2022-09-07 NOTE — Telephone Encounter (Signed)
Spoke with patient's wife, Olin Hauser and she verbalized understanding.

## 2022-09-14 ENCOUNTER — Telehealth: Payer: Self-pay | Admitting: Family Medicine

## 2022-09-14 NOTE — Telephone Encounter (Signed)
Spoke with pharmacist and they advised that patient can pick up rx.   Contacted Olin Hauser, patient's spouse, and advised her and she verbalized understanding. Stated that she is on the phone with [facility] regarding the medication and it has been resolved.

## 2022-09-14 NOTE — Telephone Encounter (Signed)
PT called and want to know if you can call and verify that glucose blood (ONETOUCH VERIO) test strip [[id number]]   Order Details  IS at the pharmacy because the pharmacy said no order has been put in

## 2022-10-03 ENCOUNTER — Other Ambulatory Visit: Payer: Self-pay | Admitting: Interventional Cardiology

## 2022-10-05 ENCOUNTER — Other Ambulatory Visit: Payer: Self-pay | Admitting: Family Medicine

## 2022-10-05 ENCOUNTER — Telehealth: Payer: Self-pay | Admitting: Family Medicine

## 2022-10-05 ENCOUNTER — Other Ambulatory Visit: Payer: Self-pay

## 2022-10-05 MED ORDER — FUROSEMIDE 20 MG PO TABS: 20.0000 mg | ORAL_TABLET | Freq: Every day | ORAL | 3 refills | Status: DC

## 2022-10-05 MED ORDER — AMLODIPINE BESYLATE 5 MG PO TABS: ORAL_TABLET | ORAL | 3 refills | Status: DC

## 2022-10-05 MED ORDER — ROSUVASTATIN CALCIUM 20 MG PO TABS: 20.0000 mg | ORAL_TABLET | Freq: Every day | ORAL | 3 refills | Status: DC

## 2022-10-05 MED ORDER — POTASSIUM CHLORIDE ER 10 MEQ PO TBCR: 20.0000 meq | EXTENDED_RELEASE_TABLET | Freq: Every day | ORAL | 3 refills | Status: DC

## 2022-10-05 NOTE — Telephone Encounter (Signed)
Pt's medications were sent to pt's pharmacy as requested. Confirmation received.  

## 2022-10-05 NOTE — Telephone Encounter (Signed)
Caller Name: pt wife Call back phone #: [phone]   MEDICATION(S):  FARXIGA 10 MG TABS tablet [[id number]]   Days of Med Remaining:   Has the patient contacted their pharmacy (YES/NO)? yes What did pharmacy advise? It was not ordered  Preferred Pharmacy:  [id number] [place], [place] - [address]: [phone] Fax: [phone]     ~~~Please advise patient/caregiver to allow 2-3 business days to process RX refills.

## 2022-10-06 ENCOUNTER — Other Ambulatory Visit: Payer: Self-pay | Admitting: Family Medicine

## 2022-10-29 ENCOUNTER — Telehealth: Payer: Self-pay

## 2022-10-29 ENCOUNTER — Encounter: Payer: Self-pay | Admitting: Family Medicine

## 2022-10-29 ENCOUNTER — Other Ambulatory Visit: Payer: Self-pay | Admitting: Family Medicine

## 2022-10-29 ENCOUNTER — Ambulatory Visit: Payer: Medicare PPO | Admitting: Family Medicine

## 2022-10-29 VITALS — BP 142/84 | HR 70 | Temp 97.2°F | Wt 193.6 lb

## 2022-10-29 DIAGNOSIS — E118 Type 2 diabetes mellitus with unspecified complications: Secondary | ICD-10-CM | POA: Insufficient documentation

## 2022-10-29 DIAGNOSIS — E1140 Type 2 diabetes mellitus with diabetic neuropathy, unspecified: Secondary | ICD-10-CM | POA: Diagnosis not present

## 2022-10-29 DIAGNOSIS — S91201A Unspecified open wound of right great toe with damage to nail, initial encounter: Secondary | ICD-10-CM

## 2022-10-29 DIAGNOSIS — E1122 Type 2 diabetes mellitus with diabetic chronic kidney disease: Secondary | ICD-10-CM | POA: Diagnosis not present

## 2022-10-29 DIAGNOSIS — I129 Hypertensive chronic kidney disease with stage 1 through stage 4 chronic kidney disease, or unspecified chronic kidney disease: Secondary | ICD-10-CM | POA: Diagnosis not present

## 2022-10-29 DIAGNOSIS — I2510 Atherosclerotic heart disease of native coronary artery without angina pectoris: Secondary | ICD-10-CM | POA: Diagnosis not present

## 2022-10-29 DIAGNOSIS — L602 Onychogryphosis: Secondary | ICD-10-CM | POA: Diagnosis not present

## 2022-10-29 DIAGNOSIS — E1142 Type 2 diabetes mellitus with diabetic polyneuropathy: Secondary | ICD-10-CM | POA: Diagnosis not present

## 2022-10-29 DIAGNOSIS — S91109A Unspecified open wound of unspecified toe(s) without damage to nail, initial encounter: Secondary | ICD-10-CM

## 2022-10-29 DIAGNOSIS — S99929A Unspecified injury of unspecified foot, initial encounter: Secondary | ICD-10-CM

## 2022-10-29 DIAGNOSIS — N183 Chronic kidney disease, stage 3 unspecified: Secondary | ICD-10-CM

## 2022-10-29 LAB — POCT GLYCOSYLATED HEMOGLOBIN (HGB A1C): Hemoglobin A1C: 6.7 % — AB (ref 4.0–5.6)

## 2022-10-29 LAB — MICROALBUMIN / CREATININE URINE RATIO
Creatinine,U: 196.3 mg/dL
Microalb Creat Ratio: 6.2 mg/g (ref 0.0–30.0)
Microalb, Ur: 12.2 mg/dL — ABNORMAL HIGH (ref 0.0–1.9)

## 2022-10-29 MED ORDER — MUPIROCIN CALCIUM 2 % EX CREA: 1.0000 | TOPICAL_CREAM | Freq: Two times a day (BID) | CUTANEOUS | 0 refills | Status: DC

## 2022-10-29 NOTE — Assessment & Plan Note (Signed)
The patient's diabetes appears to be well-managed    Plan: - Continue current diabetes management regimen. - Monitor blood sugars closely and maintain diabetes management education. - Recheck blood pressure on follow-up visit and manage accordingly.

## 2022-10-29 NOTE — Telephone Encounter (Signed)
-----  Message from Bonnita Hollow, MD sent at 10/29/2022 12:46 PM EST ----- Patient has multiple risk factors for peripheral artery disease, and that is new onset toe wound, peripheral arterial disease could complicate wound healing especially in setting of diabetes.  I am placing an order for arterial brachial index.  Please update family this the office will call to schedule.

## 2022-10-29 NOTE — Progress Notes (Signed)
Assessment/Plan:   Problem List Items Addressed This Visit       Cardiovascular and Mediastinum   Coronary artery disease involving native heart without angina pectoris     Endocrine   Diabetes ([place]) - Primary    The patient's diabetes appears to be well-managed    Plan: - Continue current diabetes management regimen. - Monitor blood sugars closely and maintain diabetes management education. - Recheck blood pressure on follow-up visit and manage accordingly.      Relevant Medications   mupirocin cream (BACTROBAN) 2 %   Other Relevant Orders   Microalbumin / creatinine urine ratio (Completed)   Ambulatory referral to Podiatry   POCT glycosylated hemoglobin (Hb A1C) (Completed)   Hyperlipidemia associated with type 2 diabetes mellitus ([place])   Diabetic foot ([place])    The patient has diabetes-related neuropathy with loss of sensation in the feet and possible increased risk of infection due to unperceived injury.  As evidenced by onychogryphosis and mild bleeding at the site of the lateral right great toe along the nail border  Plan: - Close monitoring of the feet, with a reevaluation as soon as possible, to ensure no progression of infection. - Refer to a podiatrist for comprehensive foot care and possibly for diabetic shoes to decrease the risk of further trauma. -Topical mupirocin and wound care management - Education on the importance of daily foot exams at home. - Consider imaging studies if any signs of worsening or infection occur. - Instruct to avoid pedicures at nonspecialist settings to prevent infection.      Relevant Orders   US ARTERIAL ABI (SCREENING LOWER EXTREMITY)   Other Visit Diagnoses     Open wound of toe, initial encounter       Relevant Medications   mupirocin cream (BACTROBAN) 2 %   Other Relevant Orders   Microalbumin / creatinine urine ratio (Completed)   Ambulatory referral to Podiatry   POCT glycosylated hemoglobin (Hb A1C) (Completed)   US  ARTERIAL ABI (SCREENING LOWER EXTREMITY)   Onychogryposis of toenail       Relevant Orders   Microalbumin / creatinine urine ratio (Completed)   Ambulatory referral to Podiatry   POCT glycosylated hemoglobin (Hb A1C) (Completed)   CKD stage 3 due to type 2 diabetes mellitus (HCC)       Relevant Orders   Microalbumin / creatinine urine ratio (Completed)   Ambulatory referral to Podiatry   POCT glycosylated hemoglobin (Hb A1C) (Completed)   Injury of nail bed of toe          High risk for PAD, ordering ABIs There are no discontinued medications.    Subjective:  HPI: Encounter date: 10/29/2022  RAYHAAN HUSTER is a 82 y.o. male who has Left-sided weakness; Hypertension associated with type 2 diabetes mellitus ([place]); Diabetes ([place]); Hyperlipidemia associated with type 2 diabetes mellitus ([place]); Chronic congestive heart failure ([place]); Coronary artery disease involving native heart without angina pectoris; and Diabetic foot ([place]) on their problem list..   He  has a past medical history of Abnormal EKG (03/25/2016), Benign tumor of skin, CVA (cerebral infarction) (03/24/2016), Diabetes mellitus without complication ([place]), Enlarged prostate, Hypercholesterolemia, Hyperlipidemia, Hypertension, Hypertrophy of prostate, Hypokalemia (03/25/2016), Left-sided weakness (03/24/2016), LVH (left ventricular hypertrophy), Primary osteoarthritis involving multiple joints, TIA (transient ischemic attack) (03/24/2016), and Type 2 diabetes mellitus without complication, without long-term current use of insulin ([place])..   CHIEF COMPLAINT: The patient presents with concerns about diabetic management and foot issues.  HISTORY OF PRESENT ILLNESS:  Problem 1: The patient reports having diabetes, with a recent blood sugar reading of 119 mg/dL fasting, and averages over the past 30 days are in the 150s mg/dL. He has a known past medical history of an A1c around 7.5, good LDL cholesterol levels, and stable chronic  kidney disease with a GFR around 39, indicating CKD stage 3b. No recent significant change in management or new symptoms reported.  Problem 2: The patient also presents with concerns about his feet, specifically related to diabetic neuropathy and potential infection. He has a history of decreased sensation, as evidenced by neuropathy during the foot exam, and there is concern for a mild infection due to slightly increased risk of bleeding without pain during examination. No active infection was observed, but close monitoring was recommended.  REVIEW OF SYSTEMS: - No reports of excessive thirst or urination were mentioned, which could indicate poor diabetes control. - No low blood sugars where the patient felt shaky, with readings maybe below 60 were reported. - No signs of chest pain or shortness of breath. - Blood pressure was mildly elevated.  Past Surgical History:  Procedure Laterality Date   UPPER GI ENDOSCOPY  08/17/2022   See scanned document    Outpatient Medications Prior to Visit  Medication Sig Dispense Refill   amLODipine (NORVASC) 5 MG tablet TAKE 1.5 TABLETS BY MOUTH DAILY. 135 tablet 3   Ascorbic Acid (VITAMIN C PO) Take by mouth. daily     aspirin EC 81 MG tablet Take 81 mg by mouth daily.     Cyanocobalamin 2500 MCG CHEW Chew 2,500 mcg by mouth daily.     dapagliflozin propanediol (FARXIGA) 10 MG TABS tablet TAKE 1 TABLET BY MOUTH EVERY DAY IN THE MORNING 90 tablet 0   Ferrous Sulfate (IRON) 325 (65 Fe) MG TABS Take 1 tablet by mouth daily.     furosemide (LASIX) 20 MG tablet Take 1 tablet (20 mg total) by mouth daily. 90 tablet 3   glipiZIDE (GLUCOTROL) 10 MG tablet Take 10 mg by mouth 2 (two) times daily.     glucose blood (ONETOUCH VERIO) test strip 1 each by Other route 3 (three) times daily. Use as instructed 270 each 3   losartan (COZAAR) 100 MG tablet Take 1 tablet (100 mg total) by mouth daily. 90 tablet 0   metformin (FORTAMET) 1000 MG (OSM) 24 hr tablet Take 1  tablet (1,000 mg total) by mouth 2 (two) times daily with a meal. 60 tablet 2   metoprolol succinate (TOPROL-XL) 100 MG 24 hr tablet TAKE 1.5 TABLETS (150 MG TOTAL) BY MOUTH DAILY. TAKE WITH OR IMMEDIATELY FOLLOWING A MEAL. 135 tablet 0   potassium chloride (KLOR-CON) 10 MEQ tablet Take 2 tablets (20 mEq total) by mouth daily. 180 tablet 3   rosuvastatin (CRESTOR) 20 MG tablet Take 1 tablet (20 mg total) by mouth daily. 90 tablet 3   No facility-administered medications prior to visit.    Family History  Problem Relation Age of Onset   Other Mother        natural cases   Other Father        hx unkown   Healthy Sister    Healthy Brother    Healthy Brother     Social History   Socioeconomic History   Marital status: Married    Spouse name: Not on file   Number of children: Not on file   Years of education: Not on file   Highest education level: Not on file  Occupational History   Not on file  Tobacco Use   Smoking status: Never    Passive exposure: Never   Smokeless tobacco: Never  Vaping Use   Vaping Use: Never used  Substance and Sexual Activity   Alcohol use: No   Drug use: No   Sexual activity: Not on file    Comment: married  Other Topics Concern   Not on file  Social History Narrative   Not on file   Social Determinants of Health   Financial Resource Strain: Low Risk  (09/01/2022)   Overall Financial Resource Strain (CARDIA)    Difficulty of Paying Living Expenses: Not hard at all  Food Insecurity: No Food Insecurity (09/01/2022)   Hunger Vital Sign    Worried About Running Out of Food in the Last Year: Never true    Ran Out of Food in the Last Year: Never true  Transportation Needs: No Transportation Needs (09/01/2022)   PRAPARE - Hydrologist (Medical): No    Lack of Transportation (Non-Medical): No  Physical Activity: Inactive (09/01/2022)   Exercise Vital Sign    Days of Exercise per Week: 0 days    Minutes of Exercise per  Session: 0 min  Stress: No Stress Concern Present (09/01/2022)   [place]    Feeling of Stress : Not at all  Social Connections: Not on file  Intimate Partner Violence: Not on file                                                                                                 Objective:  Physical Exam: BP (!) 142/84 (BP Location: Left Arm, Patient Position: Sitting, Cuff Size: Large)   Pulse 70   Temp (!) 97.2 F (36.2 C) (Temporal)   Wt 193 lb 9.6 oz (87.8 kg)   SpO2 97%   BMI 29.87 kg/m    General: No acute distress. Awake and conversant.  Eyes: Normal conjunctiva, anicteric. Round symmetric pupils.  ENT: Hearing grossly intact. No nasal discharge.  Neck: Neck is supple. No masses or thyromegaly.  Respiratory: Respirations are non-labored. No auditory wheezing.  Skin: Warm. No rashes or ulcers.  Psych: Alert and oriented. Cooperative, Appropriate mood and affect, Normal judgment.  CV: No cyanosis or JVD MSK: Normal ambulation. No clubbing  Neuro: Sensation and CN II-XII grossly normal.   Labs: A1c was around 7.5 in the past but improved today. Cholesterol levels were within acceptable ranges with low HDL and fantastic LDL. Kidney function tests done approximately 3 months ago showed a creatinine of around 1.6 and a GFR of about 39. Current blood glucose levels were noted with no other labs provided.  Results for orders placed or performed in visit on 10/29/22  Microalbumin / creatinine urine ratio  Result Value Ref Range   Microalb, Ur 12.2 (H) 0.0 - 1.9 mg/dL   Creatinine,U 196.3 mg/dL   Microalb Creat Ratio 6.2 0.0 - 30.0 mg/g  POCT glycosylated hemoglobin (Hb A1C)  Result Value Ref Range   Hemoglobin A1C 6.7 (A) 4.0 -  5.6 %   HbA1c POC (<> result, manual entry)     HbA1c, POC (prediabetic range)     HbA1c, POC (controlled diabetic range)      Diabetic Foot Exam - Simple   Simple Foot  Form Diabetic Foot exam was performed with the following findings: Yes 10/29/2022 11:02 AM  Visual Inspection See comments: Yes Sensation Testing See comments: Yes Pulse Check Posterior Tibialis and Dorsalis pulse intact bilaterally: Yes Comments Patient with loss of sensation to all touch points her toe and distal forefoot.  Patient also has chronic signs of nail bed injury including onychogryphosis thickening of the nails there is also mild bleeding to the right great toe lateral surface where patient said that he got a manicure, no gross signs of infection such as gangrenous appearing wound, drainage, tenderness, erythema just mild tinge of blood         Alesia Banda, MD, MS

## 2022-10-29 NOTE — Assessment & Plan Note (Signed)
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  As evidenced by onychogryphosis and mild bleeding at the site of the lateral right great toe along the nail border  Plan: - Close monitoring of the feet, with a reevaluation as soon as possible, to ensure no progression of infection. - Refer to a podiatrist for comprehensive foot care and possibly for diabetic shoes to decrease the risk of further trauma. -Topical mupirocin and wound care management - Education on the importance of daily foot exams at home. - Consider imaging studies if any signs of worsening or infection occur. - Instruct to avoid pedicures at nonspecialist settings to prevent infection.

## 2022-10-29 NOTE — Telephone Encounter (Signed)
Left patient a detailed voice message regarding annotation below.  

## 2022-10-29 NOTE — Patient Instructions (Signed)
Please monitor for signs of infection of the toe including redness, drainage, pain, bleeding, fevers, chills.  We are urgently referring to podiatry.  Please apply mupirocin ointment.  Keep area clean and dry.  Please apply nonadhesive bandages without causing significant constriction.

## 2022-10-30 NOTE — Telephone Encounter (Signed)
Patient called and said that the insurance siad they wont cover the cream but that they will cover the ointment. Patient asked for the ointment to be sent in instead

## 2022-11-02 ENCOUNTER — Ambulatory Visit: Payer: Medicare PPO | Admitting: Family Medicine

## 2022-11-06 ENCOUNTER — Telehealth: Payer: Self-pay | Admitting: Family Medicine

## 2022-11-07 ENCOUNTER — Other Ambulatory Visit: Payer: Self-pay | Admitting: Family Medicine

## 2022-11-09 ENCOUNTER — Ambulatory Visit: Payer: Medicare PPO | Admitting: Family Medicine

## 2022-11-24 ENCOUNTER — Ambulatory Visit: Payer: Medicare PPO | Admitting: Podiatry

## 2022-12-27 ENCOUNTER — Other Ambulatory Visit: Payer: Self-pay | Admitting: Family Medicine

## 2022-12-28 NOTE — Telephone Encounter (Signed)
Chart supports rx. Last OV: 10/29/2022

## 2022-12-31 NOTE — Telephone Encounter (Signed)
error 

## 2023-01-30 ENCOUNTER — Other Ambulatory Visit: Payer: Self-pay | Admitting: Family Medicine

## 2023-02-01 NOTE — Telephone Encounter (Signed)
Chart supports rx. Last OV: 11/06/2022 Next OV: 09/06/2023

## 2023-02-04 ENCOUNTER — Other Ambulatory Visit: Payer: Self-pay | Admitting: Family Medicine

## 2023-02-04 NOTE — Telephone Encounter (Signed)
Chart supports rx. Last OV: 10/29/2022   

## 2023-03-25 ENCOUNTER — Other Ambulatory Visit: Payer: Self-pay | Admitting: Family Medicine

## 2023-04-27 ENCOUNTER — Other Ambulatory Visit: Payer: Self-pay | Admitting: Family Medicine

## 2023-06-16 ENCOUNTER — Telehealth: Payer: Self-pay | Admitting: Family Medicine

## 2023-06-16 DIAGNOSIS — D649 Anemia, unspecified: Secondary | ICD-10-CM

## 2023-06-16 DIAGNOSIS — I509 Heart failure, unspecified: Secondary | ICD-10-CM

## 2023-06-16 DIAGNOSIS — E1142 Type 2 diabetes mellitus with diabetic polyneuropathy: Secondary | ICD-10-CM

## 2023-06-16 DIAGNOSIS — E1169 Type 2 diabetes mellitus with other specified complication: Secondary | ICD-10-CM

## 2023-06-16 DIAGNOSIS — E1159 Type 2 diabetes mellitus with other circulatory complications: Secondary | ICD-10-CM

## 2023-06-16 NOTE — Telephone Encounter (Signed)
Pt thought he remembered that he needs a lab appt. I see orders from 10/2022. When does he need this and does he need a OV?  Please call his wife at [phone] can leave a VM as she will be in an appt.

## 2023-06-16 NOTE — Addendum Note (Signed)
Addended by: Fanny Bien B on: 06/16/2023 04:25 PM   Modules accepted: Orders

## 2023-06-16 NOTE — Telephone Encounter (Signed)
I will call him in the morning

## 2023-06-17 ENCOUNTER — Ambulatory Visit: Payer: Medicare PPO | Admitting: Family Medicine

## 2023-06-17 ENCOUNTER — Other Ambulatory Visit (INDEPENDENT_AMBULATORY_CARE_PROVIDER_SITE_OTHER): Payer: Medicare PPO

## 2023-06-17 DIAGNOSIS — E1169 Type 2 diabetes mellitus with other specified complication: Secondary | ICD-10-CM | POA: Diagnosis not present

## 2023-06-17 DIAGNOSIS — E785 Hyperlipidemia, unspecified: Secondary | ICD-10-CM | POA: Diagnosis not present

## 2023-06-17 DIAGNOSIS — D649 Anemia, unspecified: Secondary | ICD-10-CM

## 2023-06-17 DIAGNOSIS — E1159 Type 2 diabetes mellitus with other circulatory complications: Secondary | ICD-10-CM | POA: Diagnosis not present

## 2023-06-17 DIAGNOSIS — I509 Heart failure, unspecified: Secondary | ICD-10-CM | POA: Diagnosis not present

## 2023-06-17 DIAGNOSIS — E1142 Type 2 diabetes mellitus with diabetic polyneuropathy: Secondary | ICD-10-CM | POA: Diagnosis not present

## 2023-06-17 DIAGNOSIS — I152 Hypertension secondary to endocrine disorders: Secondary | ICD-10-CM | POA: Diagnosis not present

## 2023-06-17 DIAGNOSIS — R8271 Bacteriuria: Secondary | ICD-10-CM

## 2023-06-17 LAB — MICROALBUMIN / CREATININE URINE RATIO
Creatinine,U: 101.2 mg/dL
Microalb Creat Ratio: 5.6 mg/g (ref 0.0–30.0)
Microalb, Ur: 5.7 mg/dL — ABNORMAL HIGH (ref 0.0–1.9)

## 2023-06-17 LAB — COMPREHENSIVE METABOLIC PANEL
ALT: 14 U/L (ref 0–53)
AST: 13 U/L (ref 0–37)
Albumin: 4.2 g/dL (ref 3.5–5.2)
Alkaline Phosphatase: 65 U/L (ref 39–117)
BUN: 15 mg/dL (ref 6–23)
CO2: 27 mEq/L (ref 19–32)
Calcium: 9.5 mg/dL (ref 8.4–10.5)
Chloride: 103 mEq/L (ref 96–112)
Creatinine, Ser: 1.53 mg/dL — ABNORMAL HIGH (ref 0.40–1.50)
GFR: 42.3 mL/min — ABNORMAL LOW (ref >60.00)
Glucose, Bld: 129 mg/dL — ABNORMAL HIGH (ref 70–99)
Potassium: 4.1 mEq/L (ref 3.5–5.1)
Sodium: 140 mEq/L (ref 135–145)
Total Bilirubin: 0.4 mg/dL (ref 0.2–1.2)
Total Protein: 7.2 g/dL (ref 6.0–8.3)

## 2023-06-17 LAB — TSH: TSH: 2.51 u[IU]/mL (ref 0.35–5.50)

## 2023-06-17 LAB — CBC WITH DIFFERENTIAL/PLATELET
Basophils Absolute: 0.1 10*3/uL (ref 0.0–0.1)
Basophils Relative: 0.8 % (ref 0.0–3.0)
Eosinophils Absolute: 0.5 10*3/uL (ref 0.0–0.7)
Eosinophils Relative: 6.8 % — ABNORMAL HIGH (ref 0.0–5.0)
HCT: 33.6 % — ABNORMAL LOW (ref 39.0–52.0)
Hemoglobin: 10.7 g/dL — ABNORMAL LOW (ref 13.0–17.0)
Lymphocytes Relative: 35 % (ref 12.0–46.0)
Lymphs Abs: 2.4 10*3/uL (ref 0.7–4.0)
MCHC: 32 g/dL (ref 30.0–36.0)
MCV: 88.8 fl (ref 78.0–100.0)
Monocytes Absolute: 0.4 10*3/uL (ref 0.1–1.0)
Monocytes Relative: 6.6 % (ref 3.0–12.0)
Neutro Abs: 3.5 10*3/uL (ref 1.4–7.7)
Neutrophils Relative %: 50.8 % (ref 43.0–77.0)
Platelets: 284 10*3/uL (ref 150.0–400.0)
RBC: 3.78 Mil/uL — ABNORMAL LOW (ref 4.22–5.81)
RDW: 16.1 % — ABNORMAL HIGH (ref 11.5–15.5)
WBC: 6.8 10*3/uL (ref 4.0–10.5)

## 2023-06-17 LAB — URINALYSIS, ROUTINE W REFLEX MICROSCOPIC
Bilirubin Urine: NEGATIVE
Ketones, ur: NEGATIVE
Nitrite: NEGATIVE
RBC / HPF: NONE SEEN (ref 0–?)
Specific Gravity, Urine: 1.02 (ref 1.000–1.030)
Urine Glucose: >=1000 — AB
Urobilinogen, UA: 0.2 (ref 0.0–1.0)
pH: 6 (ref 5.0–8.0)

## 2023-06-17 LAB — LIPID PANEL
Cholesterol: 99 mg/dL (ref 0–200)
HDL: 41.9 mg/dL (ref >39.00)
LDL Cholesterol: 36 mg/dL (ref 0–99)
NonHDL: 57.17
Total CHOL/HDL Ratio: 2
Triglycerides: 108 mg/dL (ref 0.0–149.0)
VLDL: 21.6 mg/dL (ref 0.0–40.0)

## 2023-06-17 LAB — HEMOGLOBIN A1C: Hgb A1c MFr Bld: 6.8 % — ABNORMAL HIGH (ref 4.6–6.5)

## 2023-06-17 NOTE — Addendum Note (Signed)
Addended by: Fanny Bien B on: 06/17/2023 04:53 PM   Modules accepted: Orders

## 2023-06-17 NOTE — Telephone Encounter (Signed)
Pt made the appt from mychart. I let him know what Dr Janee Morn said his labs are scheduled for today at 10:20 and he will wait to hear from Dr Janee Morn for an OV.

## 2023-06-17 NOTE — Telephone Encounter (Signed)
I will call them now

## 2023-06-18 ENCOUNTER — Ambulatory Visit: Payer: Medicare PPO | Admitting: Physician Assistant

## 2023-06-18 ENCOUNTER — Encounter: Payer: Self-pay | Admitting: Physician Assistant

## 2023-06-18 ENCOUNTER — Telehealth: Payer: Self-pay | Admitting: Family Medicine

## 2023-06-18 VITALS — BP 128/72 | HR 75 | Temp 98.0°F | Resp 18 | Wt 189.0 lb

## 2023-06-18 DIAGNOSIS — N3000 Acute cystitis without hematuria: Secondary | ICD-10-CM

## 2023-06-18 DIAGNOSIS — D649 Anemia, unspecified: Secondary | ICD-10-CM

## 2023-06-18 MED ORDER — SULFAMETHOXAZOLE-TRIMETHOPRIM 800-160 MG PO TABS
1.0000 | ORAL_TABLET | Freq: Two times a day (BID) | ORAL | 0 refills | Status: DC
Start: 2023-06-18 — End: 2023-11-09

## 2023-06-18 NOTE — Telephone Encounter (Signed)
Pts wife Rinaldo Cloud is concerned about his results she sees on mychart. I sent her to [facility] for assistance.

## 2023-06-18 NOTE — Telephone Encounter (Signed)
Was able to get pt scheduled for a visit at [facility] with Alfredia Ferguson, PA at 2:40 for abnormal UA as well as repeat labs. Lab orders are in future.

## 2023-06-18 NOTE — Progress Notes (Signed)
Established patient visit   Patient: Michael Hudson   DOB: [birth year]   82 y.o. Male  MRN: [id number] Visit Date: 06/18/2023  Today's healthcare provider: Alfredia Ferguson, PA-C   Cc. Follow up labs  Subjective    HPI  Pt was recently seen for labwork and a UA at his regular pcp office, and had some questions about results.  He was found to be anemic, and a UA revealed significant bacteria in his urine. Reports urinary frequency and a color change to his urine. Denies dysuria, fatigue, fever.  Medications: Outpatient Medications Prior to Visit  Medication Sig   amLODipine (NORVASC) 5 MG tablet TAKE 1.5 TABLETS BY MOUTH DAILY.   Ascorbic Acid (VITAMIN C PO) Take by mouth. daily   aspirin EC 81 MG tablet Take 81 mg by mouth daily.   Cyanocobalamin 2500 MCG CHEW Chew 2,500 mcg by mouth daily.   FARXIGA 10 MG TABS tablet TAKE 1 TABLET BY MOUTH EVERY DAY IN THE MORNING   Ferrous Sulfate (IRON) 325 (65 Fe) MG TABS Take 1 tablet by mouth daily.   furosemide (LASIX) 20 MG tablet Take 1 tablet (20 mg total) by mouth daily.   gentamicin ointment (GARAMYCIN) 0.1 % Apply 1 Application topically 3 (three) times daily.   glipiZIDE (GLUCOTROL) 10 MG tablet Take 10 mg by mouth 2 (two) times daily.   glucose blood (ONETOUCH VERIO) test strip 1 each by Other route 3 (three) times daily. Use as instructed   losartan (COZAAR) 100 MG tablet TAKE 1 TABLET BY MOUTH EVERY DAY   metoprolol succinate (TOPROL-XL) 100 MG 24 hr tablet TAKE 1 AND 1/2 TABLETS BY MOUTH EVERY DAY TAKE WITH OR IMMEDIATELY FOLLOWING A MEAL   potassium chloride (KLOR-CON) 10 MEQ tablet Take 2 tablets (20 mEq total) by mouth daily.   rosuvastatin (CRESTOR) 20 MG tablet Take 1 tablet (20 mg total) by mouth daily.   metformin (FORTAMET) 1000 MG (OSM) 24 hr tablet Take 1 tablet (1,000 mg total) by mouth 2 (two) times daily with a meal.   No facility-administered medications prior to visit.    Review of Systems   Constitutional:  Negative for fatigue and fever.  Respiratory:  Negative for cough and shortness of breath.   Cardiovascular:  Negative for chest pain, palpitations and leg swelling.  Genitourinary:  Positive for frequency.  Neurological:  Negative for dizziness and headaches.      Objective    BP 128/72 (BP Location: Left Arm, Patient Position: Sitting, Cuff Size: Normal)   Pulse 75   Temp 98 F (36.7 C) (Oral)   Resp 18   Wt 189 lb (85.7 kg)   SpO2 99%   BMI 29.16 kg/m   Physical Exam Constitutional:      General: He is awake.     Appearance: He is well-developed.  HENT:     Head: Normocephalic.  Eyes:     Conjunctiva/sclera: Conjunctivae normal.  Cardiovascular:     Rate and Rhythm: Normal rate and regular rhythm.     Heart sounds: Normal heart sounds.  Pulmonary:     Effort: Pulmonary effort is normal.     Breath sounds: Normal breath sounds.  Skin:    General: Skin is warm.  Neurological:     Mental Status: He is alert and oriented to person, place, and time.  Psychiatric:        Attention and Perception: Attention normal.        Mood and Affect:  Mood normal.        Speech: Speech normal.        Behavior: Behavior is cooperative.      No results found for any visits on 06/18/23.  Assessment & Plan     1. Anemia, unspecified type Appears to be worsening.  Ordered labs below to eval for causes Will forward results to pcp  - Vitamin B12 - Folate - Iron, TIBC and Ferritin Panel   2. Acute cystitis without hematuria UA showed significant bacteria'  Will send for culture and tx with bactrim   - Urine Culture - sulfamethoxazole-trimethoprim (BACTRIM DS) 800-160 MG tablet; Take 1 tablet by mouth 2 (two) times daily.  Dispense: 10 tablet; Refill: 0   Return if symptoms worsen or fail to improve.     I, Alfredia Ferguson, PA-C have reviewed all documentation for this visit. The documentation on  06/18/23   for the exam, diagnosis, procedures, and orders  are all accurate and complete.    Alfredia Ferguson, PA-C  [facility] at [facility] [phone] (phone) [phone] (fax)  [facility]

## 2023-06-19 LAB — IRON,TIBC AND FERRITIN PANEL
%SAT: 48 % (calc) (ref 20–48)
Ferritin: 14 ng/mL — ABNORMAL LOW (ref 24–380)
Iron: 167 ug/dL (ref 50–180)
TIBC: 349 mcg/dL (calc) (ref 250–425)

## 2023-06-19 LAB — URINE CULTURE
MICRO NUMBER:: 15495395
Result:: NO GROWTH
SPECIMEN QUALITY:: ADEQUATE

## 2023-06-19 LAB — FOLATE: Folate: 12.9 ng/mL

## 2023-06-19 LAB — VITAMIN B12: Vitamin B-12: >2000 pg/mL — ABNORMAL HIGH (ref 200–1100)

## 2023-06-25 ENCOUNTER — Other Ambulatory Visit: Payer: Self-pay | Admitting: Family Medicine

## 2023-07-05 ENCOUNTER — Ambulatory Visit: Payer: Medicare PPO | Admitting: Podiatry

## 2023-07-05 DIAGNOSIS — L401 Generalized pustular psoriasis: Secondary | ICD-10-CM

## 2023-07-05 MED ORDER — CLOBETASOL PROPIONATE 0.05 % EX OINT: 1.0000 | TOPICAL_OINTMENT | Freq: Two times a day (BID) | CUTANEOUS | 0 refills | Status: DC

## 2023-07-05 NOTE — Progress Notes (Signed)
Subjective:  Patient ID: Michael Hudson, male    DOB: Feb 22, 1941,  MRN: [id number]  Chief Complaint  Patient presents with   Foot Problem    Black spot on RT foot for a year.  Has pink pale discharge for a week.  Does not recall anything falling or hitting foot.    Discussed the use of AI scribe software for clinical note transcription with the patient, who gave verbal consent to proceed.  History of Present Illness   The patient, with a history of diabetes and psoriasis, presents with a dark, raised spot on his foot that has been present for about a year. He denies any known inciting event or trauma. The lesion has been draining a clear, pale yellow fluid. He has been managing it at home with sensitive Dove soap, witch hazel, and over-the-counter creams. He has a history of recurrent athlete's foot, but this lesion is not in the typical location for a fungal infection. He has good circulation in the foot, with a strong pulse. He also has a smaller, similar lesion on the same foot.          Objective:    Physical Exam   SKIN: Large hyperpigmented rash on dorsum of right foot with small pustules and serous drainage. Warm and well perfused foot. Palpable posterior tibial and dorsalis pedis pulse.           Results          Assessment:   1. Pustular psoriasis      Plan:  Patient was evaluated and treated and all questions answered.  Assessment and Plan    Pustular Psoriasis   The hyperpigmented rash with small pustules and serous drainage on the dorsum of his right foot, not showing signs of infection or poor circulation, is likely related to his history of psoriasis. We will start Temovate, a prescription-strength topical steroid, twice daily. He should leave the area open to air when possible and bandage it when wearing shoes. A follow-up in four weeks for reevaluation is scheduled. If there is no improvement, we will consider a skin biopsy.          Return in  about 4 weeks (around 08/02/2023) for follow up right foot rash.

## 2023-07-05 NOTE — Patient Instructions (Signed)
VISIT SUMMARY:  During your visit, we discussed your concerns about the dark, raised spot on your foot that has been draining a clear, pale yellow fluid. We also talked about your history of diabetes and psoriasis  YOUR PLAN:  -PUSTULAR PSORIASIS: The dark spot on your foot is likely related to your history of psoriasis, a condition that causes skin cells to build up and form scales and itchy, dry patches. We will start you on a prescription-strength topical steroid called Temovate, which you should apply twice daily. Try to leave the area open to air when possible and bandage it when wearing shoes. We will reevaluate the spot in four weeks. If there is no improvement, we may consider a skin biopsy.    INSTRUCTIONS:  Please start applying Temovate to the spot on your foot twice daily. Leave the area open to air when possible and bandage it when wearing shoes.  We will see you again in four weeks to reevaluate the spot on your foot.

## 2023-07-10 ENCOUNTER — Other Ambulatory Visit: Payer: Self-pay | Admitting: Family Medicine

## 2023-07-12 ENCOUNTER — Telehealth: Payer: Self-pay | Admitting: Family Medicine

## 2023-07-12 DIAGNOSIS — E119 Type 2 diabetes mellitus without complications: Secondary | ICD-10-CM

## 2023-07-12 MED ORDER — METFORMIN HCL ER (OSM) 1000 MG PO TB24: 1000.0000 mg | ORAL_TABLET | Freq: Two times a day (BID) | ORAL | 2 refills | Status: DC

## 2023-07-12 NOTE — Telephone Encounter (Signed)
Rx sent in to pharmacy. 

## 2023-07-12 NOTE — Telephone Encounter (Signed)
Prescription Request  07/12/2023  LOV: 10/29/2022  What is the name of the medication or equipment? metformin (FORTAMET) 1000 MG (OSM) 24 hr tablet [[id number]]   Have you contacted your pharmacy to request a refill? Yes   Which pharmacy would you like this sent to?  [id number] - [place], [place] - [address]OSS [address] [place] [address] Phone: [phone] Fax: [phone]      Patient notified that their request is being sent to the clinical staff for review and that they should receive a response within 2 business days.   Please advise at [facility] [phone]

## 2023-07-13 ENCOUNTER — Other Ambulatory Visit: Payer: Self-pay | Admitting: Family Medicine

## 2023-07-13 DIAGNOSIS — E119 Type 2 diabetes mellitus without complications: Secondary | ICD-10-CM

## 2023-07-14 ENCOUNTER — Telehealth: Payer: Self-pay | Admitting: Family Medicine

## 2023-07-14 DIAGNOSIS — E1142 Type 2 diabetes mellitus with diabetic polyneuropathy: Secondary | ICD-10-CM

## 2023-07-14 MED ORDER — METFORMIN HCL 500 MG PO TABS
1000.0000 mg | ORAL_TABLET | Freq: Two times a day (BID) | ORAL | 3 refills | Status: DC
Start: 2023-07-14 — End: 2023-10-19

## 2023-07-14 NOTE — Addendum Note (Signed)
Addended by: Fanny Bien B on: 07/14/2023 03:51 PM   Modules accepted: Orders

## 2023-07-14 NOTE — Telephone Encounter (Signed)
Patients wife is aware and verbalized understanding.

## 2023-07-14 NOTE — Telephone Encounter (Signed)
Please give the patient wife-caregiver a call back about metformin medicaation

## 2023-07-14 NOTE — Telephone Encounter (Signed)
Patient's wife called in to inquire what the difference is between the metformin patient was originally prescribed to patient  and the extended release. Which would be more beneficial for the patient?   Rinaldo Cloud also stated that she was hold with the insurance company regarding the price of metformin and the insurance company will contact us with anything they need from our office. I also scheduled pt for f/u visit on 10/21 at 2:20pm with Dr. Janee Morn.

## 2023-07-19 ENCOUNTER — Encounter: Payer: Self-pay | Admitting: Family Medicine

## 2023-07-19 ENCOUNTER — Ambulatory Visit: Payer: Medicare PPO | Admitting: Family Medicine

## 2023-07-19 VITALS — BP 130/80 | HR 84 | Temp 97.5°F | Wt 191.0 lb

## 2023-07-19 DIAGNOSIS — N183 Chronic kidney disease, stage 3 unspecified: Secondary | ICD-10-CM

## 2023-07-19 DIAGNOSIS — D649 Anemia, unspecified: Secondary | ICD-10-CM | POA: Diagnosis not present

## 2023-07-19 DIAGNOSIS — L409 Psoriasis, unspecified: Secondary | ICD-10-CM | POA: Diagnosis not present

## 2023-07-19 DIAGNOSIS — E1122 Type 2 diabetes mellitus with diabetic chronic kidney disease: Secondary | ICD-10-CM | POA: Diagnosis not present

## 2023-07-19 MED ORDER — CLOBETASOL PROPIONATE 0.05 % EX OINT
1.0000 | TOPICAL_OINTMENT | Freq: Two times a day (BID) | CUTANEOUS | 0 refills | Status: DC
Start: 2023-07-19 — End: 2023-12-02

## 2023-07-19 NOTE — Patient Instructions (Signed)
We are rechecking blood work to assess for inflammation and anemia.  Continue using the clobetasol ointment on both your foot and any other affected areas, such as your elbows, to help reduce inflammation and itching. Pay attention to any new or worsening symptoms, including signs of bleeding like blood in your stool, dark stools, or any other unusual occurrences.

## 2023-07-19 NOTE — Progress Notes (Unsigned)
Assessment/Plan:   Problem List Items Addressed This Visit   None   There are no discontinued medications.  No follow-ups on file.    Subjective:   Encounter date: 07/19/2023  Michael Hudson is a 82 y.o. male who has Left-sided weakness; Hypertension associated with type 2 diabetes mellitus (HCC); Diabetes (HCC); Hyperlipidemia associated with type 2 diabetes mellitus (HCC); Chronic congestive heart failure (HCC); Coronary artery disease involving native heart without angina pectoris; and Diabetic foot (HCC) on their problem list..   He  has a past medical history of Abnormal EKG (03/25/2016), Benign tumor of skin, CVA (cerebral infarction) (03/24/2016), Diabetes mellitus without complication (HCC), Enlarged prostate, Hypercholesterolemia, Hyperlipidemia, Hypertension, Hypertrophy of prostate, Hypokalemia (03/25/2016), Left-sided weakness (03/24/2016), LVH (left ventricular hypertrophy), Primary osteoarthritis involving multiple joints, TIA (transient ischemic attack) (03/24/2016), and Type 2 diabetes mellitus without complication, without long-term current use of insulin (HCC).Marland Kitchen   He presents with chief complaint of Medical Management of Chronic Issues (F/u from previous labs. Requesting eye exam from Dr. Casimiro Needle Tepedino's office. Flu shot completed at [facility]. ) .   HPI:   ROS  Past Surgical History:  Procedure Laterality Date   UPPER GI ENDOSCOPY  08/17/2022   See scanned document    Outpatient Medications Prior to Visit  Medication Sig Dispense Refill   amLODipine (NORVASC) 5 MG tablet TAKE 1.5 TABLETS BY MOUTH DAILY. 135 tablet 3   Ascorbic Acid (VITAMIN C PO) Take by mouth. daily     aspirin EC 81 MG tablet Take 81 mg by mouth daily.     clobetasol ointment (TEMOVATE) 0.05 % Apply 1 Application topically 2 (two) times daily. 30 g 0   Cyanocobalamin 2500 MCG CHEW Chew 2,500 mcg by mouth daily.     FARXIGA 10 MG TABS tablet TAKE 1 TABLET BY MOUTH EVERY DAY IN THE  MORNING 90 tablet 0   Ferrous Sulfate (IRON) 325 (65 Fe) MG TABS Take 1 tablet by mouth daily.     furosemide (LASIX) 20 MG tablet Take 1 tablet (20 mg total) by mouth daily. 90 tablet 3   glipiZIDE (GLUCOTROL) 10 MG tablet Take 10 mg by mouth 2 (two) times daily.     glucose blood (ONETOUCH VERIO) test strip 1 each by Other route 3 (three) times daily. Use as instructed 270 each 3   losartan (COZAAR) 100 MG tablet TAKE 1 TABLET BY MOUTH EVERY DAY 90 tablet 0   metFORMIN (GLUCOPHAGE) 500 MG tablet Take 2 tablets (1,000 mg total) by mouth 2 (two) times daily with a meal. 180 tablet 3   metoprolol succinate (TOPROL-XL) 100 MG 24 hr tablet TAKE 1 AND 1/2 TABLETS BY MOUTH EVERY DAY TAKE WITH OR IMMEDIATELY FOLLOWING A MEAL 135 tablet 0   potassium chloride (KLOR-CON) 10 MEQ tablet Take 2 tablets (20 mEq total) by mouth daily. 180 tablet 3   rosuvastatin (CRESTOR) 20 MG tablet Take 1 tablet (20 mg total) by mouth daily. 90 tablet 3   gentamicin ointment (GARAMYCIN) 0.1 % Apply 1 Application topically 3 (three) times daily. (Patient not taking: Reported on 07/05/2023) 15 g 0   sulfamethoxazole-trimethoprim (BACTRIM DS) 800-160 MG tablet Take 1 tablet by mouth 2 (two) times daily. (Patient not taking: Reported on 07/05/2023) 10 tablet 0   No facility-administered medications prior to visit.    Family History  Problem Relation Age of Onset   Other Mother        natural cases   Other Father  hx unkown   Healthy Sister    Healthy Brother    Healthy Brother     Social History   Socioeconomic History   Marital status: Married    Spouse name: Not on file   Number of children: Not on file   Years of education: Not on file   Highest education level: Not on file  Occupational History   Not on file  Tobacco Use   Smoking status: Never    Passive exposure: Never   Smokeless tobacco: Never  Vaping Use   Vaping status: Never Used  Substance and Sexual Activity   Alcohol use: No   Drug  use: No   Sexual activity: Not on file    Comment: married  Other Topics Concern   Not on file  Social History Narrative   Not on file   Social Determinants of Health   Financial Resource Strain: Low Risk  (09/01/2022)   Overall Financial Resource Strain (CARDIA)    Difficulty of Paying Living Expenses: Not hard at all  Food Insecurity: No Food Insecurity (09/01/2022)   Hunger Vital Sign    Worried About Running Out of Food in the Last Year: Never true    Ran Out of Food in the Last Year: Never true  Transportation Needs: No Transportation Needs (09/01/2022)   PRAPARE - Administrator, Civil Service (Medical): No    Lack of Transportation (Non-Medical): No  Physical Activity: Inactive (09/01/2022)   Exercise Vital Sign    Days of Exercise per Week: 0 days    Minutes of Exercise per Session: 0 min  Stress: No Stress Concern Present (09/01/2022)   [employer] of Occupational Health - Occupational Stress Questionnaire    Feeling of Stress : Not at all  Social Connections: Not on file  Intimate Partner Violence: Not on file                                                                                                  Objective:  Physical Exam: BP 130/80 (BP Location: Left Arm, Patient Position: Sitting, Cuff Size: Large)   Pulse 84   Temp (!) 97.5 F (36.4 C) (Temporal)   Wt 191 lb (86.6 kg)   SpO2 100%   BMI 29.47 kg/m     Physical Exam  No results found.  Recent Results (from the past 2160 hour(s))  Comprehensive metabolic panel     Status: Abnormal   Collection Time: 06/17/23 10:34 AM  Result Value Ref Range   Sodium 140 135 - 145 mEq/L   Potassium 4.1 3.5 - 5.1 mEq/L   Chloride 103 96 - 112 mEq/L   CO2 27 19 - 32 mEq/L   Glucose, Bld 129 (H) 70 - 99 mg/dL   BUN 15 6 - 23 mg/dL   Creatinine, Ser 5.57 (H) 0.40 - 1.50 mg/dL   Total Bilirubin 0.4 0.2 - 1.2 mg/dL   Alkaline Phosphatase 65 39 - 117 U/L   AST 13 0 - 37 U/L   ALT 14 0 - 53 U/L    Total Protein 7.2  6.0 - 8.3 g/dL   Albumin 4.2 3.5 - 5.2 g/dL   GFR 82.95 (L) >62.13 mL/min    Comment: Calculated using the CKD-EPI Creatinine Equation (2021)   Calcium 9.5 8.4 - 10.5 mg/dL  CBC with Differential/Platelet     Status: Abnormal   Collection Time: 06/17/23 10:34 AM  Result Value Ref Range   WBC 6.8 4.0 - 10.5 K/uL   RBC 3.78 (L) 4.22 - 5.81 Mil/uL   Hemoglobin 10.7 (L) 13.0 - 17.0 g/dL   HCT 08.6 (L) 57.8 - 46.9 %   MCV 88.8 78.0 - 100.0 fl   MCHC 32.0 30.0 - 36.0 g/dL   RDW 62.9 (H) 52.8 - 41.3 %   Platelets 284.0 150.0 - 400.0 K/uL   Neutrophils Relative % 50.8 43.0 - 77.0 %   Lymphocytes Relative 35.0 12.0 - 46.0 %   Monocytes Relative 6.6 3.0 - 12.0 %   Eosinophils Relative 6.8 (H) 0.0 - 5.0 %   Basophils Relative 0.8 0.0 - 3.0 %   Neutro Abs 3.5 1.4 - 7.7 K/uL   Lymphs Abs 2.4 0.7 - 4.0 K/uL   Monocytes Absolute 0.4 0.1 - 1.0 K/uL   Eosinophils Absolute 0.5 0.0 - 0.7 K/uL   Basophils Absolute 0.1 0.0 - 0.1 K/uL  Urinalysis, Routine w reflex microscopic     Status: Abnormal   Collection Time: 06/17/23 10:34 AM  Result Value Ref Range   Color, Urine YELLOW Yellow;Lt. Yellow;Straw;Dark Yellow;Amber;Green;Red;Brown   APPearance Cloudy (A) Clear;Turbid;Slightly Cloudy;Cloudy   Specific Gravity, Urine 1.020 1.000 - 1.030   pH 6.0 5.0 - 8.0   Total Protein, Urine TRACE (A) Negative   Urine Glucose >=1000 (A) Negative   Ketones, ur NEGATIVE Negative   Bilirubin Urine NEGATIVE Negative   Hgb urine dipstick TRACE-LYSED (A) Negative   Urobilinogen, UA 0.2 0.0 - 1.0   Leukocytes,Ua MODERATE (A) Negative   Nitrite NEGATIVE Negative   WBC, UA TNTC(>50/hpf) (A) 0-2/hpf   RBC / HPF none seen 0-2/hpf   Mucus, UA Presence of (A) None   Squamous Epithelial / HPF Rare(0-4/hpf) Rare(0-4/hpf)   Bacteria, UA Many(>50/hpf) (A) None  Microalbumin / creatinine urine ratio     Status: Abnormal   Collection Time: 06/17/23 10:34 AM  Result Value Ref Range   Microalb, Ur 5.7  (H) 0.0 - 1.9 mg/dL   Creatinine,U 244.0 mg/dL   Microalb Creat Ratio 5.6 0.0 - 30.0 mg/g  Hemoglobin A1c     Status: Abnormal   Collection Time: 06/17/23 10:34 AM  Result Value Ref Range   Hgb A1c MFr Bld 6.8 (H) 4.6 - 6.5 %    Comment: Glycemic Control Guidelines for People with Diabetes:Non Diabetic:  <6%Goal of Therapy: <7%Additional Action Suggested:  >8%   Lipid panel     Status: None   Collection Time: 06/17/23 10:34 AM  Result Value Ref Range   Cholesterol 99 0 - 200 mg/dL    Comment: ATP III Classification       Desirable:  < 200 mg/dL               Borderline High:  200 - 239 mg/dL          High:  > = 102 mg/dL   Triglycerides 725.3 0.0 - 149.0 mg/dL    Comment: Normal:  <664 mg/dLBorderline High:  150 - 199 mg/dL   HDL 40.34 >74.25 mg/dL   VLDL 95.6 0.0 - 38.7 mg/dL   LDL Cholesterol 36 0 - 99 mg/dL  Total CHOL/HDL Ratio 2     Comment:                Men          Women1/2 Average Risk     3.4          3.3Average Risk          5.0          4.42X Average Risk          9.6          7.13X Average Risk          15.0          11.0                       NonHDL 57.17     Comment: NOTE:  Non-HDL goal should be 30 mg/dL higher than patient's LDL goal (i.e. LDL goal of < 70 mg/dL, would have non-HDL goal of < 100 mg/dL)  TSH     Status: None   Collection Time: 06/17/23 10:34 AM  Result Value Ref Range   TSH 2.51 0.35 - 5.50 uIU/mL  Urine Culture     Status: None   Collection Time: 06/18/23  3:00 PM   Specimen: Urine  Result Value Ref Range   MICRO NUMBER: [id number]    SPECIMEN QUALITY: Adequate    Sample Source URINE    STATUS: FINAL    Result: No Growth   Folate     Status: None   Collection Time: 06/18/23  3:00 PM  Result Value Ref Range   Folate 12.9 ng/mL    Comment:                            Reference Range                            Low:           <3.4                            Borderline:    3.4-5.4                            Normal:        >5.4 .   Iron, TIBC  and Ferritin Panel     Status: Abnormal   Collection Time: 06/18/23  3:00 PM  Result Value Ref Range   Iron 167 50 - 180 mcg/dL   TIBC 952 841 - 324 mcg/dL (calc)   %SAT 48 20 - 48 % (calc)   Ferritin 14 (L) 24 - 380 ng/mL  Vitamin B12     Status: Abnormal   Collection Time: 06/18/23  3:00 PM  Result Value Ref Range   Vitamin B-12 >2,000 (H) 200 - 1,100 pg/mL        Garner Nash, MD, MS

## 2023-07-20 DIAGNOSIS — L409 Psoriasis, unspecified: Secondary | ICD-10-CM | POA: Insufficient documentation

## 2023-07-20 DIAGNOSIS — N183 Chronic kidney disease, stage 3 unspecified: Secondary | ICD-10-CM | POA: Insufficient documentation

## 2023-07-20 DIAGNOSIS — E1122 Type 2 diabetes mellitus with diabetic chronic kidney disease: Secondary | ICD-10-CM | POA: Insufficient documentation

## 2023-07-20 DIAGNOSIS — D649 Anemia, unspecified: Secondary | ICD-10-CM | POA: Insufficient documentation

## 2023-07-20 NOTE — Assessment & Plan Note (Addendum)
Hemoglobin decreased from 12.4 to 10.7 over ten months. Etiology of anemia is not clear despite normal B12, folate, and iron studies with slightly low ferritin.  Without clear GI bleeding sources, possibly anemia of chronic disease CKD Or psoriasis.  Plan: Repeat CBC. Review negative for potential bleeding sources. Reinforce no NSAID use and monitor aspirin intake. Follow-Up: 4 weeks to review results and determine the next steps.

## 2023-07-20 NOTE — Assessment & Plan Note (Signed)
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 Consider referral to dermatology if no improvement. (Declined today) Evaluate inflammatory markers (ESR, CRP). Follow-Up: Return as needed to assess the effectiveness of treatment and adjust management as necessary.

## 2023-07-22 ENCOUNTER — Encounter: Payer: Self-pay | Admitting: Family Medicine

## 2023-07-22 DIAGNOSIS — D6489 Other specified anemias: Secondary | ICD-10-CM

## 2023-07-22 DIAGNOSIS — E1122 Type 2 diabetes mellitus with diabetic chronic kidney disease: Secondary | ICD-10-CM

## 2023-07-23 ENCOUNTER — Other Ambulatory Visit (INDEPENDENT_AMBULATORY_CARE_PROVIDER_SITE_OTHER): Payer: Medicare PPO

## 2023-07-23 DIAGNOSIS — D649 Anemia, unspecified: Secondary | ICD-10-CM

## 2023-07-23 LAB — CBC WITH DIFFERENTIAL/PLATELET
Basophils Absolute: 0.1 10*3/uL (ref 0.0–0.1)
Basophils Relative: 1 % (ref 0.0–3.0)
Eosinophils Absolute: 0.4 10*3/uL (ref 0.0–0.7)
Eosinophils Relative: 8.2 % — ABNORMAL HIGH (ref 0.0–5.0)
HCT: 36.7 % — ABNORMAL LOW (ref 39.0–52.0)
Hemoglobin: 11.5 g/dL — ABNORMAL LOW (ref 13.0–17.0)
Lymphocytes Relative: 27.7 % (ref 12.0–46.0)
Lymphs Abs: 1.4 10*3/uL (ref 0.7–4.0)
MCHC: 31.3 g/dL (ref 30.0–36.0)
MCV: 88.8 fL (ref 78.0–100.0)
Monocytes Absolute: 0.4 10*3/uL (ref 0.1–1.0)
Monocytes Relative: 7.3 % (ref 3.0–12.0)
Neutro Abs: 2.8 10*3/uL (ref 1.4–7.7)
Neutrophils Relative %: 55.8 % (ref 43.0–77.0)
Platelets: 250 10*3/uL (ref 150.0–400.0)
RBC: 4.14 Mil/uL — ABNORMAL LOW (ref 4.22–5.81)
RDW: 15.1 % (ref 11.5–15.5)
WBC: 5 10*3/uL (ref 4.0–10.5)

## 2023-07-23 LAB — C-REACTIVE PROTEIN: CRP: <1 mg/dL (ref 0.5–20.0)

## 2023-07-23 LAB — SEDIMENTATION RATE: Sed Rate: 31 mm/h — ABNORMAL HIGH (ref 0–20)

## 2023-07-25 ENCOUNTER — Other Ambulatory Visit: Payer: Self-pay | Admitting: Family Medicine

## 2023-08-02 ENCOUNTER — Ambulatory Visit: Payer: Medicare PPO | Admitting: Podiatry

## 2023-08-02 ENCOUNTER — Encounter: Payer: Self-pay | Admitting: Podiatry

## 2023-08-02 DIAGNOSIS — L401 Generalized pustular psoriasis: Secondary | ICD-10-CM | POA: Diagnosis not present

## 2023-08-03 NOTE — Progress Notes (Signed)
  Subjective:  Patient ID: Michael Hudson, male    DOB: 1940/10/08,  MRN: [id number]  Chief Complaint  Patient presents with   Foot Problem    Black spot on RT foot for a year.  Has pink pale discharge for a week.  Does not recall anything falling or hitting foot.    Discussed the use of AI scribe software for clinical note transcription with the patient, who gave verbal consent to proceed.  History of Present Illness   He returns for follow-up he has been using the Temovate ointment once a day and notes that it is improving         Objective:    Physical Exam   SKIN: Rash continues to improve with decrease surface area and intensity of hyperpigmentation there is clearing on the margins          Results          Assessment:   1. Pustular psoriasis      Plan:  Patient was evaluated and treated and all questions answered.  Assessment and Plan    Pustular Psoriasis   Improving using the Temovate ointment recommend he continue to use this and use it twice daily.  Improving so at this point I do not think that a biopsy is necessary.  If it does not improve or worsens at any point they will return to see me for further follow-up.    Return in about 4 weeks (around 08/02/2023) for follow up right foot rash.

## 2023-09-06 ENCOUNTER — Encounter: Payer: Self-pay | Admitting: Cardiology

## 2023-09-06 ENCOUNTER — Ambulatory Visit: Payer: Medicare PPO | Attending: Cardiology | Admitting: Cardiology

## 2023-09-06 ENCOUNTER — Ambulatory Visit (INDEPENDENT_AMBULATORY_CARE_PROVIDER_SITE_OTHER): Payer: Medicare PPO

## 2023-09-06 VITALS — BP 120/80 | HR 87 | Ht 69.0 in | Wt 190.2 lb

## 2023-09-06 DIAGNOSIS — I5032 Chronic diastolic (congestive) heart failure: Secondary | ICD-10-CM | POA: Diagnosis not present

## 2023-09-06 DIAGNOSIS — E1159 Type 2 diabetes mellitus with other circulatory complications: Secondary | ICD-10-CM

## 2023-09-06 DIAGNOSIS — N183 Chronic kidney disease, stage 3 unspecified: Secondary | ICD-10-CM | POA: Diagnosis not present

## 2023-09-06 DIAGNOSIS — Z0000 Encounter for general adult medical examination without abnormal findings: Secondary | ICD-10-CM | POA: Diagnosis not present

## 2023-09-06 DIAGNOSIS — E1122 Type 2 diabetes mellitus with diabetic chronic kidney disease: Secondary | ICD-10-CM

## 2023-09-06 DIAGNOSIS — I10 Essential (primary) hypertension: Secondary | ICD-10-CM | POA: Diagnosis not present

## 2023-09-06 DIAGNOSIS — E0800 Diabetes mellitus due to underlying condition with hyperosmolarity without nonketotic hyperglycemic-hyperosmolar coma (NKHHC): Secondary | ICD-10-CM

## 2023-09-06 DIAGNOSIS — I152 Hypertension secondary to endocrine disorders: Secondary | ICD-10-CM | POA: Diagnosis not present

## 2023-09-06 NOTE — Progress Notes (Signed)
Subjective:   Michael Hudson is a 82 y.o. male who presents for Medicare Annual/Subsequent preventive examination.  Visit Complete: Virtual I connected with  [name] on 09/06/23 by a audio enabled telemedicine application and verified that I am speaking with the correct person using two identifiers.  Patient Location: Home  Provider Location: Office/Clinic  I discussed the limitations of evaluation and management by telemedicine. The patient expressed understanding and agreed to proceed.  Vital Signs: Because this visit was a virtual/telehealth visit, some criteria may be missing or patient reported. Any vitals not documented were not able to be obtained and vitals that have been documented are patient reported.    Cardiac Risk Factors include: advanced age (>25men, >65 women);diabetes mellitus;dyslipidemia;hypertension;male gender     Objective:    Today's Vitals   There is no height or weight on file to calculate BMI.     09/06/2023    2:36 PM 09/01/2022    1:42 PM 03/16/2020    8:25 PM 03/24/2016    9:05 PM  Advanced Directives  Does Patient Have a Medical Advance Directive? No No No No  Would patient like information on creating a medical advance directive?  No - Patient declined  No - patient declined information    Current Medications (verified) Outpatient Encounter Medications as of 09/06/2023  Medication Sig   amLODipine (NORVASC) 5 MG tablet TAKE 1.5 TABLETS BY MOUTH DAILY.   Ascorbic Acid (VITAMIN C PO) Take by mouth. daily   aspirin EC 81 MG tablet Take 81 mg by mouth daily.   clobetasol ointment (TEMOVATE) 0.05 % Apply 1 Application topically 2 (two) times daily.   Cyanocobalamin 2500 MCG CHEW Chew 2,500 mcg by mouth daily.   FARXIGA 10 MG TABS tablet TAKE 1 TABLET BY MOUTH EVERY DAY IN THE MORNING   Ferrous Sulfate (IRON) 325 (65 Fe) MG TABS Take 1 tablet by mouth daily.   furosemide (LASIX) 20 MG tablet Take 1 tablet (20 mg total) by mouth daily.    glipiZIDE (GLUCOTROL) 10 MG tablet Take 10 mg by mouth 2 (two) times daily.   losartan (COZAAR) 100 MG tablet TAKE 1 TABLET BY MOUTH EVERY DAY   metFORMIN (GLUCOPHAGE) 500 MG tablet Take 2 tablets (1,000 mg total) by mouth 2 (two) times daily with a meal.   metoprolol succinate (TOPROL-XL) 100 MG 24 hr tablet TAKE 1 AND 1/2 TABLETS BY MOUTH EVERY DAY TAKE WITH OR IMMEDIATELY FOLLOWING A MEAL   potassium chloride (KLOR-CON) 10 MEQ tablet Take 2 tablets (20 mEq total) by mouth daily.   rosuvastatin (CRESTOR) 20 MG tablet Take 1 tablet (20 mg total) by mouth daily.   gentamicin ointment (GARAMYCIN) 0.1 % Apply 1 Application topically 3 (three) times daily. (Patient not taking: Reported on 07/05/2023)   sulfamethoxazole-trimethoprim (BACTRIM DS) 800-160 MG tablet Take 1 tablet by mouth 2 (two) times daily. (Patient not taking: Reported on 07/05/2023)   No facility-administered encounter medications on file as of 09/06/2023.    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   Diabetes mellitus without complication (HCC)    Enlarged prostate    Hypercholesterolemia    Hyperlipidemia    Hypertension    Hypertrophy of prostate    with urinary obstruction and other lower urinary tract   Hypokalemia 03/25/2016   Left-sided weakness  03/24/2016   LVH (left ventricular hypertrophy)    Primary osteoarthritis involving multiple joints    TIA (transient ischemic attack) 03/24/2016   Type 2 diabetes mellitus without complication, without long-term current use of insulin (HCC)    Past Surgical History:  Procedure Laterality Date   UPPER GI ENDOSCOPY  08/17/2022   See scanned document   Family History  Problem Relation Age of Onset   Other Mother        natural cases   Other Father        hx unkown   Healthy  Sister    Healthy Brother    Healthy Brother    Social History   Socioeconomic History   Marital status: Married    Spouse name: Not on file   Number of children: Not on file   Years of education: Not on file   Highest education level: Not on file  Occupational History   Not on file  Tobacco Use   Smoking status: Never    Passive exposure: Never   Smokeless tobacco: Never  Vaping Use   Vaping status: Never Used  Substance and Sexual Activity   Alcohol use: No   Drug use: No   Sexual activity: Not on file    Comment: married  Other Topics Concern   Not on file  Social History Narrative   Not on file   Social Determinants of Health   Financial Resource Strain: Low Risk  (09/06/2023)   Overall Financial Resource Strain (CARDIA)    Difficulty of Paying Living Expenses: Not hard at all  Food Insecurity: No Food Insecurity (09/06/2023)   Hunger Vital Sign    Worried About Running Out of Food in the Last Year: Never true    Ran Out of Food in the Last Year: Never true  Transportation Needs: No Transportation Needs (09/06/2023)   PRAPARE - Administrator, Civil Service (Medical): No    Lack of Transportation (Non-Medical): No  Physical Activity: Insufficiently Active (09/06/2023)   Exercise Vital Sign    Days of Exercise per Week: 3 days    Minutes of Exercise per Session: 30 min  Stress: No Stress Concern Present (09/06/2023)   [employer] of Occupational Health - Occupational Stress Questionnaire    Feeling of Stress : Not at all  Social Connections: Moderately Integrated (09/06/2023)   Social Connection and Isolation Panel [NHANES]    Frequency of Communication with Friends and Family: More than three times a week    Frequency of Social Gatherings with Friends and Family: Not on file    Attends Religious Services: More than 4 times per year    Active Member of [employer] or Organizations: No    Attends Engineer, structural: Never    Marital Status:  Married    Tobacco Counseling Counseling given: Not Answered   Clinical Intake:  Pre-visit preparation completed: Yes  Pain : No/denies pain     Nutritional Risks: None Diabetes: Yes CBG done?: No Did pt. bring in CBG monitor from home?: No  How often do you need to have someone help you when you read instructions, pamphlets, or other written materials from your doctor or pharmacy?: 1 - Never  Interpreter Needed?: No  Information entered by :: NAllen LPN   Activities of Daily Living    09/06/2023    2:32 PM  In your present state of health, do you have any difficulty performing the following activities:  Hearing? 0  Vision?  0  Difficulty concentrating or making decisions? 0  Walking or climbing stairs? 0  Dressing or bathing? 0  Doing errands, shopping? 0  Preparing Food and eating ? N  Using the Toilet? N  In the past six months, have you accidently leaked urine? N  Do you have problems with loss of bowel control? N  Managing your Medications? N  Managing your Finances? N  Housekeeping or managing your Housekeeping? N    Patient Care Team: Garnette Gunner, MD as PCP - General (Family Medicine) Thomasene Ripple, DO as Consulting Physician (Cardiology)  Indicate any recent Medical Services you may have received from other than [facility] providers in the past year (date may be approximate).     Assessment:   This is a routine wellness examination for [place]  Hearing/Vision screen Hearing Screening - Comments:: Denies hearing issues Vision Screening - Comments:: Regular eye exams, Dr. Meribeth Mattes   Goals Addressed             This Visit's Progress    Patient Stated       09/06/2023, be healthy       Depression Screen    09/06/2023    2:37 PM 09/01/2022    1:43 PM  PHQ 2/9 Scores  PHQ - 2 Score 0 0    Fall Risk    09/06/2023    2:36 PM 07/19/2023    2:30 PM 09/01/2022    1:43 PM  Fall Risk   Falls in the past year? 0 0 0  Number falls in past  yr: 0 0 0  Injury with Fall? 0 0 0  Risk for fall due to : No Fall Risks;Medication side effect No Fall Risks Medication side effect  Follow up Falls prevention discussed;Falls evaluation completed Falls evaluation completed Falls prevention discussed;Education provided;Falls evaluation completed    MEDICARE RISK AT HOME: Medicare Risk at Home Any stairs in or around the home?: Yes If so, are there any without handrails?: No Home free of loose throw rugs in walkways, pet beds, electrical cords, etc?: Yes Adequate lighting in your home to reduce risk of falls?: Yes Life alert?: No Use of a cane, walker or w/c?: No Grab bars in the bathroom?: Yes Shower chair or bench in shower?: Yes Elevated toilet seat or a handicapped toilet?: Yes  TIMED UP AND GO:  Was the test performed?  No    Cognitive Function:        09/06/2023    2:37 PM 09/01/2022    1:44 PM  6CIT Screen  What Year? 0 points 0 points  What month? 0 points 0 points  What time? 0 points 0 points  Count back from 20 0 points 0 points  Months in reverse 0 points 0 points  Repeat phrase 0 points 2 points  Total Score 0 points 2 points    Immunizations Immunization History  Administered Date(s) Administered   Fluad Quad(high Dose 65+) 07/29/2019, 07/04/2022   Influenza, High Dose Seasonal PF 07/28/2018   Influenza-Unspecified 06/04/2023   PFIZER Comirnaty(Gray Top)Covid-19 Tri-Sucrose Vaccine 01/07/2021   PFIZER(Purple Top)SARS-COV-2 Vaccination 07/22/2020   Tdap 03/16/2020   Unspecified SARS-COV-2 Vaccination 07/04/2022, 06/04/2023    TDAP status: Up to date  Flu Vaccine status: Up to date  Pneumococcal vaccine status: Due, Education has been provided regarding the importance of this vaccine. Advised may receive this vaccine at local pharmacy or Health Dept. Aware to provide a copy of the vaccination record if obtained from local pharmacy or  Health Dept. Verbalized acceptance and understanding.  Covid-19  vaccine status: Information provided on how to obtain vaccines.   Qualifies for Shingles Vaccine? Yes   Zostavax completed No   Shingrix Completed?: No.    Education has been provided regarding the importance of this vaccine. Patient has been advised to call insurance company to determine out of pocket expense if they have not yet received this vaccine. Advised may also receive vaccine at local pharmacy or Health Dept. Verbalized acceptance and understanding.  Screening Tests Health Maintenance  Topic Date Due   Pneumonia Vaccine 40+ Years old (1 of 2 - PCV) Never done   Zoster Vaccines- Shingrix (1 of 2) Never done   OPHTHALMOLOGY EXAM  05/27/2022   FOOT EXAM  10/30/2023   HEMOGLOBIN A1C  12/15/2023   Diabetic kidney evaluation - eGFR measurement  06/16/2024   Diabetic kidney evaluation - Urine ACR  06/16/2024   Medicare Annual Wellness (AWV)  09/05/2024   DTaP/Tdap/Td (2 - Td or Tdap) 03/16/2030   INFLUENZA VACCINE  Completed   COVID-19 Vaccine  Completed   HPV VACCINES  Aged Out    Health Maintenance  Health Maintenance Due  Topic Date Due   Pneumonia Vaccine 43+ Years old (1 of 2 - PCV) Never done   Zoster Vaccines- Shingrix (1 of 2) Never done   OPHTHALMOLOGY EXAM  05/27/2022    Colorectal cancer screening: No longer required.   Lung Cancer Screening: (Low Dose CT Chest recommended if Age 53-80 years, 20 pack-year currently smoking OR have quit w/in 15years.) does not qualify.   Lung Cancer Screening Referral: no  Additional Screening:  Hepatitis C Screening: does not qualify;   Vision Screening: Recommended annual ophthalmology exams for early detection of glaucoma and other disorders of the eye. Is the patient up to date with their annual eye exam?  No  Who is the provider or what is the name of the office in which the patient attends annual eye exams? Dr. Meribeth Mattes If pt is not established with a provider, would they like to be referred to a provider to establish  care? No .   Dental Screening: Recommended annual dental exams for proper oral hygiene  Diabetic Foot Exam: Diabetic Foot Exam: Completed 10/29/2022  Community Resource Referral / Chronic Care Management: CRR required this visit?  No   CCM required this visit?  No     Plan:     I have personally reviewed and noted the following in the patient's chart:   Medical and social history Use of alcohol, tobacco or illicit drugs  Current medications and supplements including opioid prescriptions. Patient is not currently taking opioid prescriptions. Functional ability and status Nutritional status Physical activity Advanced directives List of other physicians Hospitalizations, surgeries, and ER visits in previous 12 months Vitals Screenings to include cognitive, depression, and falls Referrals and appointments  In addition, I have reviewed and discussed with patient certain preventive protocols, quality metrics, and best practice recommendations. A written personalized care plan for preventive services as well as general preventive health recommendations were provided to patient.     Barb Merino, LPN   56/10/1306   After Visit Summary: (Pick Up) Due to this being a telephonic visit, with patients personalized plan was offered to patient and patient has requested to Pick up at office.  Nurse Notes: none

## 2023-09-06 NOTE — Patient Instructions (Signed)
 Medication Instructions:  Your physician recommends that you continue on your current medications as directed. Please refer to the Current Medication list given to you today.  *If you need a refill on your cardiac medications before your next appointment, please call your pharmacy*   Follow-Up: At [facility], you and your health needs are our priority.  As part of our continuing mission to provide you with exceptional heart care, we have created designated Provider Care Teams.  These Care Teams include your primary Cardiologist (physician) and Advanced Practice Providers (APPs -  Physician Assistants and Nurse Practitioners) who all work together to provide you with the care you need, when you need it.   Your next appointment:   1 year(s)  Provider:   Thomasene Ripple, DO

## 2023-09-06 NOTE — Patient Instructions (Addendum)
[name] , Thank you for taking time to come for your Medicare Wellness Visit. I appreciate your ongoing commitment to your health goals. Please review the following plan we discussed and let me know if I can assist you in the future.   Referrals/Orders/Follow-Ups/Clinician Recommendations: none  This is a list of the screening recommended for you and due dates:  Health Maintenance  Topic Date Due   Pneumonia Vaccine (1 of 2 - PCV) Never done   Zoster (Shingles) Vaccine (1 of 2) Never done   Eye exam for diabetics  05/27/2022   Complete foot exam   10/30/2023   Hemoglobin A1C  12/15/2023   Yearly kidney function blood test for diabetes  06/16/2024   Yearly kidney health urinalysis for diabetes  06/16/2024   Medicare Annual Wellness Visit  09/05/2024   DTaP/Tdap/Td vaccine (2 - Td or Tdap) 03/16/2030   Flu Shot  Completed   COVID-19 Vaccine  Completed   HPV Vaccine  Aged Out    Advanced directives: (ACP Link)Information on Advanced Care Planning can be found at [facility] Advance Health Care Directives Advance Health Care Directives ([email or web address]   Next Medicare Annual Wellness Visit scheduled for next year: No, will schedule next year  insert Preventive Care attachment Insert FALL PREVENTION attachment if needed

## 2023-09-06 NOTE — Progress Notes (Signed)
Cardiology Office Note:    Date:  09/06/2023   ID:  LANDEN AMANTE, DOB [birth year], MRN [id number]  PCP:  Garnette Gunner, MD  Cardiologist:  Thomasene Ripple, DO  Electrophysiologist:  None   Referring MD: Garnette Gunner, MD   " I am ok"  History of Present Illness:    Michael Hudson is a 82 y.o. male with a hx of DM type II HTN, HLD, CKD stage III, HFpEF, CVA (2017).   The patient's wife, who is actively involved in his care, expresses concerns about the number of medications the patient is taking, particularly Metoprolol. The patient is currently on Amlodipine, Aspirin, Lasix, Losartan, Toprol XL, and Crestor for his cardiovascular conditions. His diabetes is managed by his primary care physician with Farxiga, Glipizide, and Metformin. The patient does not report any new symptoms or changes in his condition. He denies experiencing lightheadedness or dizziness and describes himself as fairly active. The patient's wife monitors his blood sugar levels at home and adjusts his diabetes medication dosage based on the readings.  No complaints.   Past Medical History:  Diagnosis Date   Abnormal EKG 03/25/2016   Benign tumor of skin    CVA (cerebral infarction) 03/24/2016   Diabetes mellitus without complication (HCC)    Enlarged prostate    Hypercholesterolemia    Hyperlipidemia    Hypertension    Hypertrophy of prostate    with urinary obstruction and other lower urinary tract   Hypokalemia 03/25/2016   Left-sided weakness 03/24/2016   LVH (left ventricular hypertrophy)    Primary osteoarthritis involving multiple joints    TIA (transient ischemic attack) 03/24/2016   Type 2 diabetes mellitus without complication, without long-term current use of insulin [facility])     Past Surgical History:  Procedure Laterality Date   UPPER GI ENDOSCOPY  08/17/2022   See scanned document    Current Medications: No outpatient medications have been marked as taking for the 09/06/23 encounter  (Office Visit) with Thomasene Ripple, DO.     Allergies:   Shellfish allergy, Neosporin [neomycin-bacitracin zn-polymyx], and Zinc   Social History   Socioeconomic History   Marital status: Married    Spouse name: Not on file   Number of children: Not on file   Years of education: Not on file   Highest education level: Not on file  Occupational History   Not on file  Tobacco Use   Smoking status: Never    Passive exposure: Never   Smokeless tobacco: Never  Vaping Use   Vaping status: Never Used  Substance and Sexual Activity   Alcohol use: No   Drug use: No   Sexual activity: Not on file    Comment: married  Other Topics Concern   Not on file  Social History Narrative   Not on file   Social Determinants of Health   Financial Resource Strain: Low Risk  (09/06/2023)   Overall Financial Resource Strain (CARDIA)    Difficulty of Paying Living Expenses: Not hard at all  Food Insecurity: No Food Insecurity (09/06/2023)   Hunger Vital Sign    Worried About Running Out of Food in the Last Year: Never true    Ran Out of Food in the Last Year: Never true  Transportation Needs: No Transportation Needs (09/06/2023)   PRAPARE - Administrator, Civil Service (Medical): No    Lack of Transportation (Non-Medical): No  Physical Activity: Insufficiently Active (09/06/2023)   Exercise Vital Sign  Days of Exercise per Week: 3 days    Minutes of Exercise per Session: 30 min  Stress: No Stress Concern Present (09/06/2023)   [employer] of Occupational Health - Occupational Stress Questionnaire    Feeling of Stress : Not at all  Social Connections: Moderately Integrated (09/06/2023)   Social Connection and Isolation Panel [NHANES]    Frequency of Communication with Friends and Family: More than three times a week    Frequency of Social Gatherings with Friends and Family: Not on file    Attends Religious Services: More than 4 times per year    Active Member of [employer] or  Organizations: No    Attends Banker Meetings: Never    Marital Status: Married     Family History: The patient's family history includes Healthy in his brother, brother, and sister; Other in his father and mother.  ROS:   Review of Systems  Constitution: Negative for decreased appetite, fever and weight gain.  HENT: Negative for congestion, ear discharge, hoarse voice and sore throat.   Eyes: Negative for discharge, redness, vision loss in right eye and visual halos.  Cardiovascular: Negative for chest pain, dyspnea on exertion, leg swelling, orthopnea and palpitations.  Respiratory: Negative for cough, hemoptysis, shortness of breath and snoring.   Endocrine: Negative for heat intolerance and polyphagia.  Hematologic/Lymphatic: Negative for bleeding problem. Does not bruise/bleed easily.  Skin: Negative for flushing, nail changes, rash and suspicious lesions.  Musculoskeletal: Negative for arthritis, joint pain, muscle cramps, myalgias, neck pain and stiffness.  Gastrointestinal: Negative for abdominal pain, bowel incontinence, diarrhea and excessive appetite.  Genitourinary: Negative for decreased libido, genital sores and incomplete emptying.  Neurological: Negative for brief paralysis, focal weakness, headaches and loss of balance.  Psychiatric/Behavioral: Negative for altered mental status, depression and suicidal ideas.  Allergic/Immunologic: Negative for HIV exposure and persistent infections.    EKGs/Labs/Other Studies Reviewed:    The following studies were reviewed today:   EKG:  The ekg ordered today demonstrates   Recent Labs: 06/17/2023: ALT 14; BUN 15; Creatinine, Ser 1.53; Potassium 4.1; Sodium 140; TSH 2.51 07/23/2023: Hemoglobin 11.5; Platelets 250.0  Recent Lipid Panel    Component Value Date/Time   CHOL 99 06/17/2023 1034   CHOL 103 02/10/2021 1456   TRIG 108.0 06/17/2023 1034   HDL 41.90 06/17/2023 1034   HDL 33 (L) 02/10/2021 1456    CHOLHDL 2 06/17/2023 1034   VLDL 21.6 06/17/2023 1034   LDLCALC 36 06/17/2023 1034   LDLCALC 54 02/10/2021 1456    Physical Exam:    VS:  BP 120/80   Pulse 87   Ht 5\' 9"  (1.753 m)   Wt 190 lb 3.2 oz (86.3 kg)   SpO2 97%   BMI 28.09 kg/m     Wt Readings from Last 3 Encounters:  09/06/23 190 lb 3.2 oz (86.3 kg)  07/19/23 191 lb (86.6 kg)  06/18/23 189 lb (85.7 kg)     GEN: Well nourished, well developed in no acute distress HEENT: Normal NECK: No JVD; No carotid bruits LYMPHATICS: No lymphadenopathy CARDIAC: S1S2 noted,RRR, no murmurs, rubs, gallops RESPIRATORY:  Clear to auscultation without rales, wheezing or rhonchi  ABDOMEN: Soft, non-tender, non-distended, +bowel sounds, no guarding. EXTREMITIES: No edema, No cyanosis, no clubbing MUSCULOSKELETAL:  No deformity  SKIN: Warm and dry NEUROLOGIC:  Alert and oriented x 3, non-focal PSYCHIATRIC:  Normal affect, good insight  ASSESSMENT:    1. Essential hypertension   2. Hypertension associated with type 2  diabetes mellitus (HCC)   3. Chronic heart failure with preserved ejection fraction (HCC)   4. Diabetes mellitus due to underlying condition with hyperosmolarity without coma, without long-term current use of insulin (HCC)   5. CKD stage 3 due to type 2 diabetes mellitus (HCC)    PLAN:    Hypertension Well controlled on Amlodipine, Losartan, and Toprol XL. Discussed concerns about the number of medications and the use of Toprol XL. No symptoms of lightheadedness or dizziness reported. -Continue current regimen.  Hyperlipidemia On Rosuvastatin for cholesterol management. -Continue current regimen.  Diabetes Mellitus Well controlled on Farxiga, Glipizide, and Metformin. Discussed concerns about the high dose of Glipizide. They will discussed with his pcp.  -Continue current regimen.  General Health Maintenance -Aspirin 81mg  daily for cardiovascular disease prevention can be discontinued due to lack of history of  heart attack or stroke. -Continue regular follow-up with primary care provider.   The patient is in agreement with the above plan. The patient left the office in stable condition.  The patient will follow up in   Medication Adjustments/Labs and Tests Ordered: Current medicines are reviewed at length with the patient today.  Concerns regarding medicines are outlined above.  Orders Placed This Encounter  Procedures   EKG 12-Lead   No orders of the defined types were placed in this encounter.   Patient Instructions  Medication Instructions:  Your physician recommends that you continue on your current medications as directed. Please refer to the Current Medication list given to you today.  *If you need a refill on your cardiac medications before your next appointment, please call your pharmacy*   Follow-Up: At [facility], you and your health needs are our priority.  As part of our continuing mission to provide you with exceptional heart care, we have created designated Provider Care Teams.  These Care Teams include your primary Cardiologist (physician) and Advanced Practice Providers (APPs -  Physician Assistants and Nurse Practitioners) who all work together to provide you with the care you need, when you need it.  Your next appointment:   1 year(s)  Provider:   Thomasene Ripple, DO    Adopting a Healthy Lifestyle.  Know what a healthy weight is for you (roughly BMI <25) and aim to maintain this   Aim for 7+ servings of fruits and vegetables daily   65-80+ fluid ounces of water or unsweet tea for healthy kidneys   Limit to max 1 drink of alcohol per day; avoid smoking/tobacco   Limit animal fats in diet for cholesterol and heart health - choose grass fed whenever available   Avoid highly processed foods, and foods high in saturated/trans fats   Aim for low stress - take time to unwind and care for your mental health   Aim for 150 min of moderate intensity exercise  weekly for heart health, and weights twice weekly for bone health   Aim for 7-9 hours of sleep daily   When it comes to diets, agreement about the perfect plan isnt easy to find, even among the experts. Experts at the [facility] of [employer] developed an idea known as the Healthy Eating Plate. Just imagine a plate divided into logical, healthy portions.   The emphasis is on diet quality:   Load up on vegetables and fruits - one-half of your plate: Aim for color and variety, and remember that potatoes dont count.   Go for whole grains - one-quarter of your plate: Whole wheat, barley, wheat berries,  quinoa, oats, brown rice, and foods made with them. If you want pasta, go with whole wheat pasta.   Protein power - one-quarter of your plate: Fish, chicken, beans, and nuts are all healthy, versatile protein sources. Limit red meat.   The diet, however, does go beyond the plate, offering a few other suggestions.   Use healthy plant oils, such as olive, canola, soy, corn, sunflower and peanut. Check the labels, and avoid partially hydrogenated oil, which have unhealthy trans fats.   If youre thirsty, drink water. Coffee and tea are good in moderation, but skip sugary drinks and limit milk and dairy products to one or two daily servings.   The type of carbohydrate in the diet is more important than the amount. Some sources of carbohydrates, such as vegetables, fruits, whole grains, and beans-are healthier than others.   Finally, stay active  Signed, Thomasene Ripple, DO  09/06/2023 2:51 PM    [facility]

## 2023-09-07 ENCOUNTER — Other Ambulatory Visit: Payer: Self-pay | Admitting: Family Medicine

## 2023-09-07 MED ORDER — DAPAGLIFLOZIN PROPANEDIOL 10 MG PO TABS: 10.0000 mg | ORAL_TABLET | Freq: Every day | ORAL | 0 refills | Status: DC

## 2023-09-07 MED ORDER — METOPROLOL SUCCINATE ER 100 MG PO TB24: 150.0000 mg | ORAL_TABLET | Freq: Every day | ORAL | 0 refills | Status: DC

## 2023-09-07 MED ORDER — LOSARTAN POTASSIUM 100 MG PO TABS: 100.0000 mg | ORAL_TABLET | Freq: Every day | ORAL | 0 refills | Status: DC

## 2023-09-15 ENCOUNTER — Inpatient Hospital Stay: Payer: Medicare PPO | Admitting: Hematology & Oncology

## 2023-09-15 ENCOUNTER — Inpatient Hospital Stay: Payer: Medicare PPO

## 2023-10-04 ENCOUNTER — Telehealth: Payer: Self-pay | Admitting: Family Medicine

## 2023-10-04 NOTE — Telephone Encounter (Signed)
 Copied from CRM [id number]. Topic: Referral - Question >> Oct 04, 2023  2:09 PM Adaysia C wrote: Reason for CRM: Patients wife has called on behalf of patient to request a callback from the provider about lab work that was scheduled for the patient with referred hematologist but it was cancelled by the hematologists. Patients wife would like to speak with Dr Sebastian directly about scheduling a new referral appointment. Please follow up with patients wife [phone] and she is available to speak after practice hours as well.

## 2023-10-04 NOTE — Telephone Encounter (Signed)
 KO 10/04/2023 - see patient message from 07/22/2023 re: referral  Copied from CRM [id number]. Topic: Referral - Question >> Oct 04, 2023  2:09 PM Michael Hudson wrote: Reason for CRM: Patients wife has called on behalf of patient to request a callback from the provider about lab work that was scheduled for the patient with referred hematologist but it was cancelled by the hematologists. Patients wife would like to speak with Dr Sebastian directly about scheduling a new referral appointment. Please follow up with patients wife [phone] and she is available to speak after practice hours as well.

## 2023-10-05 NOTE — Telephone Encounter (Signed)
 Spoke with patient's wife and advised her of annotation below. Patient still urged if possible if Dr. Sebastian could contact patient and urge him on how important it is to see hematology on 10/18/2023. Patient canceled once back in December. I advised Sharlet that we can discuss during an office visit. She stated that he could come in for his physical on 02/11 at 1:20pm, but would rather have a phone call if possible to urge patient to go to hematology appointment. Please advise. Are you able to contact patient by phone?

## 2023-10-06 NOTE — Telephone Encounter (Signed)
 Copied from CRM [id number]. Topic: General - Other >> Oct 06, 2023  1:43 PM [name] wrote: Reason for CRM: Patient wife called in returning Dr. Ronnell Freshwater call / would like a call back once available [phone]

## 2023-10-14 ENCOUNTER — Other Ambulatory Visit: Payer: Self-pay

## 2023-10-14 MED ORDER — POTASSIUM CHLORIDE ER 10 MEQ PO TBCR: 20.0000 meq | EXTENDED_RELEASE_TABLET | Freq: Every day | ORAL | 3 refills | Status: AC

## 2023-10-14 NOTE — Telephone Encounter (Signed)
Copied from CRM [id number]. Topic: General - Other >> Oct 14, 2023  3:12 PM Elizebeth Brooking wrote: Reason for CRM: Patients wife called in requesting to speak to Dr.Thompson or Nurse. She has stated that she has been trying to get in contact with Dr.Thompson for a week now. Her call back number is [phone]

## 2023-10-18 ENCOUNTER — Inpatient Hospital Stay: Payer: Medicare PPO | Attending: Hematology & Oncology

## 2023-10-18 ENCOUNTER — Other Ambulatory Visit: Payer: Self-pay

## 2023-10-18 ENCOUNTER — Other Ambulatory Visit: Payer: Self-pay | Admitting: Family Medicine

## 2023-10-18 ENCOUNTER — Encounter: Payer: Self-pay | Admitting: Hematology & Oncology

## 2023-10-18 ENCOUNTER — Inpatient Hospital Stay: Payer: Medicare PPO | Admitting: Hematology & Oncology

## 2023-10-18 VITALS — BP 112/60 | HR 70 | Temp 97.8°F | Resp 17 | Ht 69.5 in | Wt 191.0 lb

## 2023-10-18 DIAGNOSIS — D530 Protein deficiency anemia: Secondary | ICD-10-CM

## 2023-10-18 DIAGNOSIS — N1832 Chronic kidney disease, stage 3b: Secondary | ICD-10-CM | POA: Insufficient documentation

## 2023-10-18 DIAGNOSIS — E1142 Type 2 diabetes mellitus with diabetic polyneuropathy: Secondary | ICD-10-CM

## 2023-10-18 DIAGNOSIS — Z832 Family history of diseases of the blood and blood-forming organs and certain disorders involving the immune mechanism: Secondary | ICD-10-CM | POA: Insufficient documentation

## 2023-10-18 DIAGNOSIS — D509 Iron deficiency anemia, unspecified: Secondary | ICD-10-CM | POA: Diagnosis not present

## 2023-10-18 DIAGNOSIS — E1122 Type 2 diabetes mellitus with diabetic chronic kidney disease: Secondary | ICD-10-CM | POA: Diagnosis not present

## 2023-10-18 DIAGNOSIS — Z860100 Personal history of colon polyps, unspecified: Secondary | ICD-10-CM | POA: Insufficient documentation

## 2023-10-18 DIAGNOSIS — N183 Chronic kidney disease, stage 3 unspecified: Secondary | ICD-10-CM | POA: Diagnosis not present

## 2023-10-18 LAB — CMP (CANCER CENTER ONLY)
ALT: 18 U/L (ref 0–44)
AST: 15 U/L (ref 15–41)
Albumin: 4.3 g/dL (ref 3.5–5.0)
Alkaline Phosphatase: 76 U/L (ref 38–126)
Anion gap: 9 (ref 5–15)
BUN: 16 mg/dL (ref 8–23)
CO2: 26 mmol/L (ref 22–32)
Calcium: 9.3 mg/dL (ref 8.9–10.3)
Chloride: 102 mmol/L (ref 98–111)
Creatinine: 1.74 mg/dL — ABNORMAL HIGH (ref 0.61–1.24)
GFR, Estimated: 39 mL/min — ABNORMAL LOW (ref >60)
Glucose, Bld: 284 mg/dL — ABNORMAL HIGH (ref 70–99)
Potassium: 3.7 mmol/L (ref 3.5–5.1)
Sodium: 137 mmol/L (ref 135–145)
Total Bilirubin: 0.4 mg/dL (ref 0.0–1.2)
Total Protein: 7.4 g/dL (ref 6.5–8.1)

## 2023-10-18 LAB — CBC WITH DIFFERENTIAL (CANCER CENTER ONLY)
Abs Immature Granulocytes: 0.04 10*3/uL (ref 0.00–0.07)
Basophils Absolute: 0 10*3/uL (ref 0.0–0.1)
Basophils Relative: 1 %
Eosinophils Absolute: 0.4 10*3/uL (ref 0.0–0.5)
Eosinophils Relative: 7 %
HCT: 38.9 % — ABNORMAL LOW (ref 39.0–52.0)
Hemoglobin: 13 g/dL (ref 13.0–17.0)
Immature Granulocytes: 1 %
Lymphocytes Relative: 34 %
Lymphs Abs: 1.7 10*3/uL (ref 0.7–4.0)
MCH: 28.8 pg (ref 26.0–34.0)
MCHC: 33.4 g/dL (ref 30.0–36.0)
MCV: 86.1 fL (ref 80.0–100.0)
Monocytes Absolute: 0.4 10*3/uL (ref 0.1–1.0)
Monocytes Relative: 7 %
Neutro Abs: 2.5 10*3/uL (ref 1.7–7.7)
Neutrophils Relative %: 50 %
Platelet Count: 238 10*3/uL (ref 150–400)
RBC: 4.52 MIL/uL (ref 4.22–5.81)
RDW: 13.6 % (ref 11.5–15.5)
WBC Count: 4.9 10*3/uL (ref 4.0–10.5)
nRBC: 0 % (ref 0.0–0.2)

## 2023-10-18 LAB — VITAMIN B12: Vitamin B-12: 3498 pg/mL — ABNORMAL HIGH (ref 180–914)

## 2023-10-18 LAB — FERRITIN: Ferritin: 24 ng/mL (ref 24–336)

## 2023-10-18 LAB — RETICULOCYTES
Immature Retic Fract: 14 % (ref 2.3–15.9)
RBC.: 4.57 MIL/uL (ref 4.22–5.81)
Retic Count, Absolute: 64.9 10*3/uL (ref 19.0–186.0)
Retic Ct Pct: 1.4 % (ref 0.4–3.1)

## 2023-10-18 LAB — SAVE SMEAR(SSMR), FOR PROVIDER SLIDE REVIEW

## 2023-10-18 MED ORDER — AMLODIPINE BESYLATE 5 MG PO TABS: ORAL_TABLET | ORAL | 3 refills | Status: DC

## 2023-10-18 MED ORDER — FUROSEMIDE 20 MG PO TABS: 20.0000 mg | ORAL_TABLET | Freq: Every day | ORAL | 3 refills | Status: DC

## 2023-10-18 MED ORDER — ROSUVASTATIN CALCIUM 20 MG PO TABS: 20.0000 mg | ORAL_TABLET | Freq: Every day | ORAL | 3 refills | Status: DC

## 2023-10-18 NOTE — Addendum Note (Signed)
Addended by: Margaret Pyle D on: 10/18/2023 10:29 AM   Modules accepted: Orders

## 2023-10-18 NOTE — Telephone Encounter (Signed)
I called and spoke with patient's wife and notified her if her husband needs to be seen sooner than we can schedule an earlier appointment and she is ok with 11/09/23 appointment.

## 2023-10-18 NOTE — Progress Notes (Signed)
Referral MD  Reason for Referral: Iron deficiency anemia; anemia renal sufficiency  Chief Complaint  Patient presents with   New Patient (Initial Visit)  : I had a low blood.  HPI: Michael Hudson is a very nice 83 year old African-American male.  He is retired [employer].  It was really interesting talking to he and his wife.  He and his wife have been married for 58 years.  Overall, he is doing okay.  He did have a stroke back in 2017.  Marina Goodell has diabetes.  He has anemia.  Going back through his record, he was found to have anemia back in September.  At that time, his hemoglobin was 10.7 with hematocrit 33.6.  His MCV was 88.8.  At that time, his iron saturation was 14%.  He has been on some oral iron with vitamin C.  I was very impressed with how well he is improved.  Today, his hemoglobin is 13 with a hematocrit of 38.9%.  He has had no obvious bleeding.  His wife says last colonoscopy was in 2022.  He had multiple colonic polyps.  He has no history of sickle cell.  There is no blood in the family who has bleeding.  He does have diabetes.  Has had diabetes for quite a while.  He has had no problems with nausea or vomiting.  He is not a vegetarian.  He was referred to the [facility] for the anemia.  He has never had COVID.  He has had past surgery but nothing that is serious.  He has had no weight loss or weight gain.  There is been no rashes.  He has had no neuropathy.  Overall, I would say that his performance status is probably ECOG 1.    Past Medical History:  Diagnosis Date   Abnormal EKG 03/25/2016   Benign tumor of skin    CVA (cerebral infarction) 03/24/2016   Diabetes mellitus without complication (HCC)    Enlarged prostate    Hypercholesterolemia    Hyperlipidemia    Hypertension    Hypertrophy of prostate    with urinary obstruction and other lower urinary tract   Hypokalemia 03/25/2016   Left-sided weakness 03/24/2016   LVH  (left ventricular hypertrophy)    Primary osteoarthritis involving multiple joints    TIA (transient ischemic attack) 03/24/2016   Type 2 diabetes mellitus without complication, without long-term current use of insulin (HCC)   :   Past Surgical History:  Procedure Laterality Date   UPPER GI ENDOSCOPY  08/17/2022   See scanned document  :   Current Outpatient Medications:    amLODipine (NORVASC) 5 MG tablet, TAKE 1.5 TABLETS BY MOUTH DAILY., Disp: 135 tablet, Rfl: 3   Ascorbic Acid (VITAMIN C PO), Take by mouth. daily, Disp: , Rfl:    aspirin EC 81 MG tablet, Take 81 mg by mouth daily., Disp: , Rfl:    clobetasol ointment (TEMOVATE) 0.05 %, Apply 1 Application topically 2 (two) times daily., Disp: 30 g, Rfl: 0   Cyanocobalamin 2500 MCG CHEW, Chew 2,500 mcg by mouth daily., Disp: , Rfl:    dapagliflozin propanediol (FARXIGA) 10 MG TABS tablet, Take 1 tablet (10 mg total) by mouth daily. TAKE 1 TABLET BY MOUTH EVERY DAY IN THE MORNING, Disp: 90 tablet, Rfl: 0   Ferrous Sulfate (IRON) 325 (65 Fe) MG TABS, Take 1 tablet by mouth daily., Disp: , Rfl:    furosemide (LASIX) 20 MG tablet, Take 1 tablet (20  mg total) by mouth daily., Disp: 90 tablet, Rfl: 3   gentamicin ointment (GARAMYCIN) 0.1 %, Apply 1 Application topically 3 (three) times daily. (Patient not taking: Reported on 07/05/2023), Disp: 15 g, Rfl: 0   glipiZIDE (GLUCOTROL) 10 MG tablet, Take 10 mg by mouth 2 (two) times daily., Disp: , Rfl:    losartan (COZAAR) 100 MG tablet, Take 1 tablet (100 mg total) by mouth daily., Disp: 90 tablet, Rfl: 0   metFORMIN (GLUCOPHAGE) 500 MG tablet, Take 2 tablets (1,000 mg total) by mouth 2 (two) times daily with a meal., Disp: 180 tablet, Rfl: 3   metoprolol succinate (TOPROL-XL) 100 MG 24 hr tablet, Take 1.5 tablets (150 mg total) by mouth daily. Take with or immediately following a meal.TAKE 1 AND 1/2 TABLETS BY MOUTH EVERY DAY TAKE WITH OR IMMEDIATELY FOLLOWING A MEAL, Disp: 135 tablet, Rfl: 0    potassium chloride (KLOR-CON) 10 MEQ tablet, Take 2 tablets (20 mEq total) by mouth daily., Disp: 180 tablet, Rfl: 3   rosuvastatin (CRESTOR) 20 MG tablet, Take 1 tablet (20 mg total) by mouth daily., Disp: 90 tablet, Rfl: 3   sulfamethoxazole-trimethoprim (BACTRIM DS) 800-160 MG tablet, Take 1 tablet by mouth 2 (two) times daily. (Patient not taking: Reported on 07/05/2023), Disp: 10 tablet, Rfl: 0:  :   Allergies  Allergen Reactions   Shellfish Allergy Nausea And Vomiting   Neosporin [Neomycin-Bacitracin Zn-Polymyx] Rash   Zinc Rash  :   Family History  Problem Relation Age of Onset   Other Mother        natural cases   Other Father        hx unkown   Healthy Sister    Healthy Brother    Healthy Brother   :   Social History   Socioeconomic History   Marital status: Married    Spouse name: Not on file   Number of children: Not on file   Years of education: Not on file   Highest education level: Not on file  Occupational History   Not on file  Tobacco Use   Smoking status: Never    Passive exposure: Never   Smokeless tobacco: Never  Vaping Use   Vaping status: Never Used  Substance and Sexual Activity   Alcohol use: No   Drug use: No   Sexual activity: Not on file    Comment: married  Other Topics Concern   Not on file  Social History Narrative   Not on file   Social Drivers of Health   Financial Resource Strain: Low Risk  (09/06/2023)   Overall Financial Resource Strain (CARDIA)    Difficulty of Paying Living Expenses: Not hard at all  Food Insecurity: No Food Insecurity (09/06/2023)   Hunger Vital Sign    Worried About Running Out of Food in the Last Year: Never true    Ran Out of Food in the Last Year: Never true  Transportation Needs: No Transportation Needs (09/06/2023)   PRAPARE - Administrator, Civil Service (Medical): No    Lack of Transportation (Non-Medical): No  Physical Activity: Insufficiently Active (09/06/2023)   Exercise Vital  Sign    Days of Exercise per Week: 3 days    Minutes of Exercise per Session: 30 min  Stress: No Stress Concern Present (09/06/2023)   [employer] of Occupational Health - Occupational Stress Questionnaire    Feeling of Stress : Not at all  Social Connections: Moderately Integrated (09/06/2023)   Social Connection  and Isolation Panel [NHANES]    Frequency of Communication with Friends and Family: More than three times a week    Frequency of Social Gatherings with Friends and Family: Not on file    Attends Religious Services: More than 4 times per year    Active Member of [employer] or Organizations: No    Attends Banker Meetings: Never    Marital Status: Married  Catering manager Violence: Not At Risk (09/06/2023)   Humiliation, Afraid, Rape, and Kick questionnaire    Fear of Current or Ex-Partner: No    Emotionally Abused: No    Physically Abused: No    Sexually Abused: No  :  Pertinent items are noted in HPI.  Exam: Vital signs show temperature of 97.8.  Pulse 70.  Blood pressure 112/60.  Weight is 191 pounds.  @IPVITALS @ Physical Exam Vitals reviewed.  HENT:     Head: Normocephalic and atraumatic.  Eyes:     Pupils: Pupils are equal, round, and reactive to light.  Cardiovascular:     Rate and Rhythm: Normal rate and regular rhythm.     Heart sounds: Normal heart sounds.  Pulmonary:     Effort: Pulmonary effort is normal.     Breath sounds: Normal breath sounds.  Abdominal:     General: Bowel sounds are normal.     Palpations: Abdomen is soft.  Musculoskeletal:        General: No tenderness or deformity. Normal range of motion.     Cervical back: Normal range of motion.  Lymphadenopathy:     Cervical: No cervical adenopathy.  Skin:    General: Skin is warm and dry.     Findings: No erythema or rash.  Neurological:     Mental Status: He is alert and oriented to person, place, and time.  Psychiatric:        Behavior: Behavior normal.        Thought  Content: Thought content normal.        Judgment: Judgment normal.     Recent Labs    10/18/23 1356  WBC 4.9  HGB 13.0  HCT 38.9*  PLT 238    Recent Labs    10/18/23 1356  NA 137  K 3.7  CL 102  CO2 26  GLUCOSE 284*  BUN 16  CREATININE 1.74*  CALCIUM 9.3    Blood smear review: Normochromic normocytic population of red blood cells.  There are no nucleated red blood cells.  There are no teardrop cells.  I see no schistocytes or spherocytes.  There is no rouleaux formation.  White blood cells appear normal in morphology and maturation.  There is no immature myeloid or lymphoid forms.  I see no hypersegmented polys.  Platelets are adequate in number and size.  Pathology: None    Assessment and Plan: Michael Hudson is a very nice 83 year old African-American male.  He has resolved anemia.  I must say that he is certainly looking quite good with respect to his blood counts.  I believe that the anemia is clearly is from his iron deficiency.  Again this is much better.  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  His blood sugar today is 284.  For right now, he does not need a bone marrow biopsy.  His blood smear is unremarkable.  Given his age, he could certainly have myelodysplasia but again I really do not think we have to put him  through any additional studies.  I would like to see him back in 3 months.  We will see how his blood counts look.

## 2023-10-19 LAB — ERYTHROPOIETIN: Erythropoietin: 25.1 m[IU]/mL — ABNORMAL HIGH (ref 2.6–18.5)

## 2023-10-19 LAB — IRON AND IRON BINDING CAPACITY (CC-WL,HP ONLY)
Iron: 82 ug/dL (ref 45–182)
Saturation Ratios: 22 % (ref 17.9–39.5)
TIBC: 368 ug/dL (ref 250–450)
UIBC: 286 ug/dL (ref 117–376)

## 2023-10-31 ENCOUNTER — Other Ambulatory Visit: Payer: Self-pay | Admitting: Family Medicine

## 2023-10-31 ENCOUNTER — Other Ambulatory Visit: Payer: Self-pay | Admitting: Cardiology

## 2023-10-31 DIAGNOSIS — E119 Type 2 diabetes mellitus without complications: Secondary | ICD-10-CM

## 2023-11-03 MED ORDER — AMLODIPINE BESYLATE 5 MG PO TABS: 5.0000 mg | ORAL_TABLET | Freq: Every day | ORAL | 3 refills | Status: DC

## 2023-11-03 NOTE — Telephone Encounter (Signed)
 Pt is requesting 2.5 mg tablets, because the 5 mg tablets are too hard to cut. Please address

## 2023-11-03 NOTE — Telephone Encounter (Signed)
Refilled separately as requested

## 2023-11-09 ENCOUNTER — Ambulatory Visit: Payer: Medicare PPO | Admitting: Family Medicine

## 2023-11-09 ENCOUNTER — Encounter: Payer: Self-pay | Admitting: Family Medicine

## 2023-11-09 VITALS — BP 134/82 | HR 76 | Temp 97.5°F | Ht 69.5 in | Wt 190.0 lb

## 2023-11-09 DIAGNOSIS — E1159 Type 2 diabetes mellitus with other circulatory complications: Secondary | ICD-10-CM | POA: Diagnosis not present

## 2023-11-09 DIAGNOSIS — E1169 Type 2 diabetes mellitus with other specified complication: Secondary | ICD-10-CM | POA: Diagnosis not present

## 2023-11-09 DIAGNOSIS — I152 Hypertension secondary to endocrine disorders: Secondary | ICD-10-CM

## 2023-11-09 DIAGNOSIS — Z7984 Long term (current) use of oral hypoglycemic drugs: Secondary | ICD-10-CM | POA: Diagnosis not present

## 2023-11-09 DIAGNOSIS — E785 Hyperlipidemia, unspecified: Secondary | ICD-10-CM | POA: Diagnosis not present

## 2023-11-09 DIAGNOSIS — E1122 Type 2 diabetes mellitus with diabetic chronic kidney disease: Secondary | ICD-10-CM

## 2023-11-09 DIAGNOSIS — N1832 Chronic kidney disease, stage 3b: Secondary | ICD-10-CM

## 2023-11-09 DIAGNOSIS — L602 Onychogryphosis: Secondary | ICD-10-CM | POA: Diagnosis not present

## 2023-11-09 LAB — POCT GLYCOSYLATED HEMOGLOBIN (HGB A1C): Hemoglobin A1C: 7.5 % — AB (ref 4.0–5.6)

## 2023-11-09 NOTE — Progress Notes (Signed)
Assessment/Plan:      Type 2 Diabetes Mellitus Chronic condition with recent increase in A1c from 6.8% to 7.5% over four months. Blood glucose level was 284 mg/dL in January. Currently on dapagliflozin 10 mg daily, metformin 1000 mg twice a day, and glipizide 10 mg twice a day. Prefers to avoid additional medications and is not interested in seeing a dietician. Discussed the importance of lifestyle modifications, including diet and exercise, to manage blood sugar levels. Discussed potential addition of Rybelsus, but patient declined. Emphasized the need for at least 150 minutes of moderate exercise per week and timing of exercise to reduce postprandial blood sugar spikes. - Increase exercise to at least 150 minutes per week, including brisk walking, jogging, and weightlifting. - Encourage timing of exercise, such as walking after meals, to help reduce blood sugar spikes. - Recheck A1c in three months.  Nail Fungus Thickening and darkening of the left big toenail, with similar changes in the second and third toes. Possible differential includes diabetes-related changes, nail fungus, or psoriatic disease. Discussed potential referral to podiatry or dermatology for further evaluation and treatment. - Discuss potential referral to podiatry with patient's wife.  Hypertension Blood pressure today is 134/82 mmHg. Condition is being managed by cardiology. No current symptoms of chest pain or shortness of breath. - Continue current management with cardiology.  Hyperlipidemia Cholesterol levels are well controlled on rosuvastatin. Condition is being monitored by cardiology. - Continue current management with cardiology.  Chronic Kidney Disease Stage 3 Well-managed condition with recent lab work showing stable kidney function. Anemia previously associated with CKD has resolved. - Continue monitoring kidney function with regular lab work.  General Health Maintenance Has not had a diabetic eye exam  in the past year. Previous optometrist retired. - Schedule an appointment with a new eye doctor for a diabetic eye exam.  Follow-up - Schedule follow-up appointment in three months.        Medications Discontinued During This Encounter  Medication Reason   sulfamethoxazole-trimethoprim (BACTRIM DS) 800-160 MG tablet    gentamicin ointment (GARAMYCIN) 0.1 %     Return in about 3 months (around 02/06/2024) for DM.    Subjective:   Encounter date: 11/09/2023  Michael Hudson is a 83 y.o. male who has Left-sided weakness; Cerebral infarction [facility]); Hypertension associated with type 2 diabetes mellitus (HCC); Diabetes mellitus without complication (HCC); Hyperlipidemia associated with type 2 diabetes mellitus (HCC); Chronic congestive heart failure (HCC); Coronary artery disease involving native heart without angina pectoris; Diabetic foot (HCC); Anemia; Type 2 diabetes mellitus with stage 3b chronic kidney disease, without long-term current use of insulin (HCC); Psoriasis; B12 deficiency; Cataract; Gastric intestinal metaplasia; Gastric polyps; History of colon polyps; Open angle with borderline findings of both eyes; Primary open angle glaucoma of both eyes, mild stage; and Onychogryposis of toenail on their problem list..   He  has a past medical history of Abnormal EKG (03/25/2016), Benign tumor of skin, CVA (cerebral infarction) (03/24/2016), Diabetes mellitus without complication (HCC), Enlarged prostate, Hypercholesterolemia, Hyperlipidemia, Hypertension, Hypertrophy of prostate, Hypokalemia (03/25/2016), Left-sided weakness (03/24/2016), LVH (left ventricular hypertrophy), Primary osteoarthritis involving multiple joints, TIA (transient ischemic attack) (03/24/2016), and Type 2 diabetes mellitus without complication, without long-term current use of insulin (HCC).Marland Kitchen   He presents with chief complaint of Annual Exam (Non fasting. Will schedule eye exam) .   Discussed the use of AI scribe  software for clinical note transcription with the patient, who gave verbal consent to proceed.  History of Present Illness  Michael Hudson is an 83 year old male with diabetes, hypertension, and CKD stage 3 who presents for chronic follow-up.  He is here for a follow-up on his diabetes management. Recent lab work shows an increase in A1c from 6.8% four months ago to 7.5% today, with a blood glucose level of 284 mg/dL in January, higher than usual. He is on dapagliflozin 10 mg daily, metformin 1000 mg twice a day, and glipizide 10 mg twice a day. He prefers to avoid additional medications, stating he takes enough already.  His lifestyle includes sporadic exercise, and he has not been exercising recently. His diet consists of fruit and cereal in the morning, a light lunch, and a dinner of meat, vegetables, and starch. He occasionally consumes soda and eats rice and potatoes. He has not seen a dietician and feels his wife, who is also diabetic, prepares meals suitable for managing diabetes.  He follows with cardiology and hematology, with a current blood pressure of 134/82 mmHg. No chest pain, shortness of breath, excessive thirst, or excessive urination. His cholesterol is well-controlled on rosuvastatin. CKD stage 3 is stable, and previously related anemia has resolved.  He has not had a diabetic eye exam in the past year due to his optometrist's retirement and plans to follow up with the new provider. His wife checks his blood sugars at home three times a day, but he is unsure of the specific readings, mentioning his wife could provide more information if needed.        Past Surgical History:  Procedure Laterality Date   UPPER GI ENDOSCOPY  08/17/2022   See scanned document    Outpatient Medications Prior to Visit  Medication Sig Dispense Refill   amLODipine (NORVASC) 2.5 MG tablet Take 1 tablet (2.5 mg total) by mouth daily. To be taken with 5 mg tablet for total of 7.5 mg daily 90 tablet  3   amLODipine (NORVASC) 5 MG tablet Take 1 tablet (5 mg total) by mouth daily. 90 tablet 3   Ascorbic Acid (VITAMIN C PO) Take by mouth. daily     aspirin EC 81 MG tablet Take 81 mg by mouth daily.     clobetasol ointment (TEMOVATE) 0.05 % Apply 1 Application topically 2 (two) times daily. 30 g 0   Cyanocobalamin 2500 MCG CHEW Chew 2,500 mcg by mouth daily.     dapagliflozin propanediol (FARXIGA) 10 MG TABS tablet Take 1 tablet (10 mg total) by mouth daily. TAKE 1 TABLET BY MOUTH EVERY DAY IN THE MORNING 90 tablet 0   Ferrous Sulfate (IRON) 325 (65 Fe) MG TABS Take 1 tablet by mouth daily.     furosemide (LASIX) 20 MG tablet Take 1 tablet (20 mg total) by mouth daily. 90 tablet 3   glipiZIDE (GLUCOTROL) 10 MG tablet Take 10 mg by mouth 2 (two) times daily.     glucose blood (ONETOUCH VERIO) test strip USE 1 STRIP THREE TIMES DAILY AS DIRECTED 300 strip 0   losartan (COZAAR) 100 MG tablet Take 1 tablet (100 mg total) by mouth daily. 90 tablet 0   metFORMIN (GLUCOPHAGE) 500 MG tablet TAKE 2 TABLETS (1,000 MG TOTAL) BY MOUTH 2 (TWO) TIMES DAILY WITH A MEAL. 360 tablet 1   metoprolol succinate (TOPROL-XL) 100 MG 24 hr tablet Take 1.5 tablets (150 mg total) by mouth daily. Take with or immediately following a meal.TAKE 1 AND 1/2 TABLETS BY MOUTH EVERY DAY TAKE WITH OR IMMEDIATELY FOLLOWING A MEAL 135 tablet 0  potassium chloride (KLOR-CON) 10 MEQ tablet Take 2 tablets (20 mEq total) by mouth daily. 180 tablet 3   rosuvastatin (CRESTOR) 20 MG tablet Take 1 tablet (20 mg total) by mouth daily. 90 tablet 3   gentamicin ointment (GARAMYCIN) 0.1 % Apply 1 Application topically 3 (three) times daily. (Patient not taking: Reported on 11/09/2023) 15 g 0   sulfamethoxazole-trimethoprim (BACTRIM DS) 800-160 MG tablet Take 1 tablet by mouth 2 (two) times daily. (Patient not taking: Reported on 11/09/2023) 10 tablet 0   No facility-administered medications prior to visit.    Family History  Problem Relation  Age of Onset   Other Mother        natural cases   Other Father        hx unkown   Healthy Sister    Healthy Brother    Healthy Brother     Social History   Socioeconomic History   Marital status: Married    Spouse name: Not on file   Number of children: Not on file   Years of education: Not on file   Highest education level: Not on file  Occupational History   Not on file  Tobacco Use   Smoking status: Never    Passive exposure: Never   Smokeless tobacco: Never  Vaping Use   Vaping status: Never Used  Substance and Sexual Activity   Alcohol use: No   Drug use: No   Sexual activity: Not on file    Comment: married  Other Topics Concern   Not on file  Social History Narrative   Not on file   Social Drivers of Health   Financial Resource Strain: Low Risk  (09/06/2023)   Overall Financial Resource Strain (CARDIA)    Difficulty of Paying Living Expenses: Not hard at all  Food Insecurity: No Food Insecurity (09/06/2023)   Hunger Vital Sign    Worried About Running Out of Food in the Last Year: Never true    Ran Out of Food in the Last Year: Never true  Transportation Needs: No Transportation Needs (09/06/2023)   PRAPARE - Administrator, Civil Service (Medical): No    Lack of Transportation (Non-Medical): No  Physical Activity: Insufficiently Active (09/06/2023)   Exercise Vital Sign    Days of Exercise per Week: 3 days    Minutes of Exercise per Session: 30 min  Stress: No Stress Concern Present (09/06/2023)   [employer] of Occupational Health - Occupational Stress Questionnaire    Feeling of Stress : Not at all  Social Connections: Moderately Integrated (09/06/2023)   Social Connection and Isolation Panel [NHANES]    Frequency of Communication with Friends and Family: More than three times a week    Frequency of Social Gatherings with Friends and Family: Not on file    Attends Religious Services: More than 4 times per year    Active Member of  [employer] or Organizations: No    Attends Banker Meetings: Never    Marital Status: Married  Catering manager Violence: Not At Risk (09/06/2023)   Humiliation, Afraid, Rape, and Kick questionnaire    Fear of Current or Ex-Partner: No    Emotionally Abused: No    Physically Abused: No    Sexually Abused: No  Objective:  Physical Exam: BP 134/82   Pulse 76   Temp (!) 97.5 F (36.4 C) (Temporal)   Ht 5' 9.5" (1.765 m)   Wt 190 lb (86.2 kg)   SpO2 99%   BMI 27.66 kg/m    Wt Readings from Last 3 Encounters:  11/09/23 190 lb (86.2 kg)  10/18/23 191 lb (86.6 kg)  09/06/23 190 lb 3.2 oz (86.3 kg)    Physical Exam Constitutional:      Appearance: Normal appearance.  HENT:     Head: Normocephalic and atraumatic.     Right Ear: Hearing normal.     Left Ear: Hearing normal.     Nose: Nose normal.  Eyes:     General: No scleral icterus.       Right eye: No discharge.        Left eye: No discharge.     Extraocular Movements: Extraocular movements intact.  Cardiovascular:     Rate and Rhythm: Normal rate and regular rhythm.     Heart sounds: Normal heart sounds.  Pulmonary:     Effort: Pulmonary effort is normal.     Breath sounds: Normal breath sounds.  Abdominal:     Palpations: Abdomen is soft.     Tenderness: There is no abdominal tenderness.  Skin:    General: Skin is warm.     Findings: No rash.  Neurological:     General: No focal deficit present.     Mental Status: He is alert.     Cranial Nerves: No cranial nerve deficit.  Psychiatric:        Mood and Affect: Mood normal.        Behavior: Behavior normal.        Thought Content: Thought content normal.        Judgment: Judgment normal.    Diabetic Foot Exam - Simple   Simple Foot Form Diabetic Foot exam was performed with the following findings: Yes   Visual Inspection Sensation Testing Intact to  touch and monofilament testing bilaterally: Yes Pulse Check Posterior Tibialis and Dorsalis pulse intact bilaterally: Yes Comments Onychomicosis on multiple nails. Remainder of skin findings normal.      No results found.  Recent Results (from the past 2160 hours)  CBC with Differential (Cancer Center Only)     Status: Abnormal   Collection Time: 10/18/23  1:56 PM  Result Value Ref Range   WBC Count 4.9 4.0 - 10.5 K/uL   RBC 4.52 4.22 - 5.81 MIL/uL   Hemoglobin 13.0 13.0 - 17.0 g/dL   HCT 16.1 (L) 09.6 - 04.5 %   MCV 86.1 80.0 - 100.0 fL   MCH 28.8 26.0 - 34.0 pg   MCHC 33.4 30.0 - 36.0 g/dL   RDW 40.9 81.1 - 91.4 %   Platelet Count 238 150 - 400 K/uL   nRBC 0.0 0.0 - 0.2 %   Neutrophils Relative % 50 %   Neutro Abs 2.5 1.7 - 7.7 K/uL   Lymphocytes Relative 34 %   Lymphs Abs 1.7 0.7 - 4.0 K/uL   Monocytes Relative 7 %   Monocytes Absolute 0.4 0.1 - 1.0 K/uL   Eosinophils Relative 7 %   Eosinophils Absolute 0.4 0.0 - 0.5 K/uL   Basophils Relative 1 %   Basophils Absolute 0.0 0.0 - 0.1 K/uL   Immature Granulocytes 1 %   Abs Immature Granulocytes 0.04 0.00 - 0.07 K/uL    Comment: Performed at [facility] at  [facility], [address], [place], [place] [address]  CMP (Cancer Center only)     Status: Abnormal   Collection Time: 10/18/23  1:56 PM  Result Value Ref Range   Sodium 137 135 - 145 mmol/L   Potassium 3.7 3.5 - 5.1 mmol/L   Chloride 102 98 - 111 mmol/L   CO2 26 22 - 32 mmol/L   Glucose, Bld 284 (H) 70 - 99 mg/dL    Comment: Glucose reference range applies only to samples taken after fasting for at least 8 hours.   BUN 16 8 - 23 mg/dL   Creatinine 6.04 (H) 5.40 - 1.24 mg/dL   Calcium 9.3 8.9 - 98.1 mg/dL   Total Protein 7.4 6.5 - 8.1 g/dL   Albumin 4.3 3.5 - 5.0 g/dL   AST 15 15 - 41 U/L   ALT 18 0 - 44 U/L   Alkaline Phosphatase 76 38 - 126 U/L   Total Bilirubin 0.4 0.0 - 1.2 mg/dL   GFR, Estimated 39 (L) >60 mL/min    Comment:  (NOTE) Calculated using the CKD-EPI Creatinine Equation (2021)    Anion gap 9 5 - 15    Comment: Performed at [facility] at [facility], [address], [place], [place] [address]  Erythropoietin     Status: Abnormal   Collection Time: 10/18/23  1:56 PM  Result Value Ref Range   Erythropoietin 25.1 (H) 2.6 - 18.5 mIU/mL    Comment: (NOTE) [employer] UniCel DxI 800 Immunoassay System Values obtained with different assay methods or kits cannot be used interchangeably. Results cannot be interpreted as absolute evidence of the presence or absence of malignant disease. Performed At: [facility] [address] [place], [place] [id number] Jolene Schimke MD QM:[id number]   Save Smear for Provider Slide Review     Status: None   Collection Time: 10/18/23  1:56 PM  Result Value Ref Range   Smear Review SMEAR STAINED AND AVAILABLE FOR REVIEW     Comment: Performed at [facility] at [facility], [address], [place], [place] [address]  Reticulocytes     Status: None   Collection Time: 10/18/23  1:56 PM  Result Value Ref Range   Retic Ct Pct 1.4 0.4 - 3.1 %   RBC. 4.57 4.22 - 5.81 MIL/uL   Retic Count, Absolute 64.9 19.0 - 186.0 K/uL   Immature Retic Fract 14.0 2.3 - 15.9 %    Comment: Performed at [facility] at [facility], [address] [place], [place] [address]  Ferritin     Status: None   Collection Time: 10/18/23  1:56 PM  Result Value Ref Range   Ferritin 24 24 - 336 ng/mL    Comment: Performed at [facility], [address] [place], [place] [address]  Iron and Iron Binding Capacity ([facility] only)     Status: None   Collection Time: 10/18/23  1:56 PM  Result Value Ref Range   Iron 82 45 - 182 ug/dL   TIBC 366 440 - 347 ug/dL   Saturation Ratios 22 17.9 - 39.5 %   UIBC 286 117 - 376 ug/dL    Comment: Performed at [facility],  [address] [place], [place] [address]  Vitamin B12     Status: Abnormal   Collection Time: 10/18/23  1:56 PM  Result Value Ref Range  Vitamin B-12 3,498 (H) 180 - 914 pg/mL    Comment: RESULT CONFIRMED BY MANUAL DILUTION (NOTE) This assay is not validated for testing neonatal or myeloproliferative syndrome specimens for Vitamin B12 levels. Performed at [facility], [address] [place], [place] [address]   POCT glycosylated hemoglobin (Hb A1C)     Status: Abnormal   Collection Time: 11/09/23  1:17 PM  Result Value Ref Range   Hemoglobin A1C 7.5 (A) 4.0 - 5.6 %   HbA1c POC (<> result, manual entry)     HbA1c, POC (prediabetic range)     HbA1c, POC (controlled diabetic range)          Garner Nash, MD, MS

## 2023-11-09 NOTE — Patient Instructions (Addendum)
To manage these blood sugar, continue current medications. Also, adopt a healthy lifestyle by eating a low-carb diet rich in non-starchy vegetables, fruits, whole grains like oats and brown rice, and lean proteins such as poultry, fish, beans, and nuts. Include healthy fats from sources like avocados, nuts, and olive oil, while avoiding trans fats and limiting saturated fats. Choose whole, unprocessed foods over processed ones. Additionally, aim for at least 30 minutes of moderate cardio exercise most days and incorporate resistance training for each muscle group twice a week.   Please follow-up and get your diabetic eye exam   VISIT SUMMARY:  Today, we reviewed your diabetes management, blood pressure, cholesterol, and kidney function. We also discussed your nail condition and general health maintenance.  YOUR PLAN:  -TYPE 2 DIABETES MELLITUS: Type 2 diabetes is a chronic condition that affects the way your body processes blood sugar. Your recent lab results show an increase in A1c from 6.8% to 7.5%, indicating higher blood sugar levels. We discussed the importance of lifestyle changes, including increasing your exercise to at least 150 minutes per week and timing exercise after meals to help reduce blood sugar spikes. We will recheck your A1c in three months.  -NAIL FUNGUS: You have thickening and darkening of your left big toenail and similar changes in the second and third toes, which could be due to nail fungus or other conditions. We discussed the possibility of referring you to a podiatrist for further evaluation and treatment.  -HYPERTENSION: Hypertension, or high blood pressure, is being managed by your cardiologist. Your current blood pressure is 134/82 mmHg, and you have no symptoms like chest pain or shortness of breath. Continue with your current management plan.  -HYPERLIPIDEMIA: Hyperlipidemia means having high levels of cholesterol in your blood. Your cholesterol levels are well  controlled with rosuvastatin, and this condition is being monitored by your cardiologist. Continue with your current management plan.  -CHRONIC KIDNEY DISEASE STAGE 3: Chronic kidney disease (CKD) stage 3 means your kidneys are moderately damaged. Your condition is stable, and the anemia previously associated with CKD has resolved. Continue monitoring your kidney function with regular lab work.  -GENERAL HEALTH MAINTENANCE: You have not had a diabetic eye exam in the past year. It is important to have regular eye exams to monitor for any diabetes-related eye issues. Please schedule an appointment with a new eye doctor for a diabetic eye exam.  INSTRUCTIONS:  Please schedule a follow-up appointment in three months. Additionally, make an appointment with a new eye doctor for a diabetic eye exam.

## 2023-11-11 ENCOUNTER — Telehealth: Payer: Self-pay | Admitting: Podiatry

## 2023-11-11 NOTE — Telephone Encounter (Signed)
Pts wife called and the pcp recommended pt to get nail fungus treatment and may have told pt to call us to get a rx. Pts wife stated she was not there so she is not sure what exactly was said.

## 2023-11-11 NOTE — Telephone Encounter (Signed)
Left message for pts wife that if the pcp is not going to treat pt that he would need to see Dr Lilian Kapur for an evaluation as he has never treated pt for this. Please call to schedule an appt if pcp is not taking care of it.

## 2023-12-02 ENCOUNTER — Encounter: Payer: Self-pay | Admitting: Podiatry

## 2023-12-02 ENCOUNTER — Ambulatory Visit: Payer: Medicare PPO | Admitting: Podiatry

## 2023-12-02 DIAGNOSIS — M79675 Pain in left toe(s): Secondary | ICD-10-CM

## 2023-12-02 DIAGNOSIS — L409 Psoriasis, unspecified: Secondary | ICD-10-CM

## 2023-12-02 DIAGNOSIS — M79674 Pain in right toe(s): Secondary | ICD-10-CM

## 2023-12-02 DIAGNOSIS — B351 Tinea unguium: Secondary | ICD-10-CM | POA: Diagnosis not present

## 2023-12-02 MED ORDER — CLOBETASOL PROPIONATE 0.05 % EX OINT: 1.0000 | TOPICAL_OINTMENT | Freq: Two times a day (BID) | CUTANEOUS | 0 refills | Status: DC

## 2023-12-02 MED ORDER — CICLOPIROX 8 % EX SOLN
Freq: Every day | CUTANEOUS | 0 refills | Status: DC
Start: 2023-12-02 — End: 2024-06-28

## 2023-12-02 NOTE — Progress Notes (Signed)
  Subjective:  Patient ID: Michael Hudson, male    DOB: April 22, 1941,  MRN: [id number]  Chief Complaint  Patient presents with   Nail Problem    "Check my toenails out."  Wife stated, "He needs to get a prescription for Athlete's Feet."    83 y.o. male presents with the above complaint. History confirmed with patient.  Still treating the psoriasis with the Temovate ointment but he needs a refill  Objective:  Physical Exam: warm, good capillary refill, no trophic changes or ulcerative lesions, normal DP and PT pulses, and normal sensory exam. Left Foot: dystrophic yellowed discolored nail plates with subungual debris Right Foot: dystrophic yellowed discolored nail plates with subungual debris and dorsal psoriasis has improved quite a bit      Assessment:   1. Onychomycosis   2. Psoriasis      Plan:  Patient was evaluated and treated and all questions answered.  Discussed the etiology and treatment options for the condition in detail with the patient. Educated patient on the topical and oral and laser treatment options for mycotic nails. Recommended debridement of the nails today. Sharp and mechanical debridement performed of all painful and mycotic nails today. Nails debrided in length and thickness using a nail nipper to level of comfort. Discussed treatment options including appropriate shoe gear. Follow up as needed for painful nails.  He would like to have it treated.  He does have a history of chronic kidney disease, I discussed topical treatment with Penlac and its use.  Rx sent to pharmacy.  Psoriasis is improving.  Recommended continued treatment with Temovate.  Refill sent to pharmacy.     Return in about 6 months (around 06/03/2024), or if symptoms worsen or fail to improve, for follow up after nail fungus treatment.

## 2023-12-04 ENCOUNTER — Other Ambulatory Visit: Payer: Self-pay | Admitting: Family Medicine

## 2023-12-04 DIAGNOSIS — E119 Type 2 diabetes mellitus without complications: Secondary | ICD-10-CM

## 2023-12-20 ENCOUNTER — Other Ambulatory Visit: Payer: Self-pay | Admitting: Family Medicine

## 2023-12-21 ENCOUNTER — Other Ambulatory Visit: Payer: Self-pay

## 2023-12-21 DIAGNOSIS — E1142 Type 2 diabetes mellitus with diabetic polyneuropathy: Secondary | ICD-10-CM

## 2023-12-21 MED ORDER — METFORMIN HCL 500 MG PO TABS: 1000.0000 mg | ORAL_TABLET | Freq: Two times a day (BID) | ORAL | 1 refills | Status: DC

## 2023-12-21 NOTE — Telephone Encounter (Signed)
 Received fax from previous provider's office, Forrest Moron, MD. They received medication refill request for metformin and glipizide.

## 2024-01-01 ENCOUNTER — Other Ambulatory Visit: Payer: Self-pay | Admitting: Family Medicine

## 2024-01-03 MED ORDER — EMPAGLIFLOZIN 25 MG PO TABS: 25.0000 mg | ORAL_TABLET | Freq: Every day | ORAL | 0 refills | Status: DC

## 2024-01-04 ENCOUNTER — Telehealth: Payer: Self-pay

## 2024-01-04 NOTE — Telephone Encounter (Signed)
 Copied from CRM [id number]. Topic: General - Other >> Jan 04, 2024  3:37 PM Truddie Crumble wrote: Reason for CRM: patient wife called stating the pharmacy sent her a text saying a new medication was ready for pick up for the patient called jardiance and she would like to be called regarding it because she was unaware of this medication CB [phone]

## 2024-01-04 NOTE — Telephone Encounter (Signed)
 Please advise. I see where medication was ordered in place of farxiga, but pt's wife would like to know why it was changed.

## 2024-01-04 NOTE — Telephone Encounter (Signed)
 Patient's wife is aware of annotation below and verbalized understanding. Stated she would reach out to insurance.

## 2024-01-05 ENCOUNTER — Telehealth: Payer: Self-pay | Admitting: Cardiology

## 2024-01-05 NOTE — Telephone Encounter (Signed)
 Pt c/o medication issue:  1. Name of Medication:   empagliflozin (JARDIANCE) 25 MG TABS tablet  FARXIGA 10 MG TABS tablet   2. How are you currently taking this medication (dosage and times per day)?   3. Are you having a reaction (difficulty breathing--STAT)?   4. What is your medication issue?   Wife Rinaldo Cloud) stated patient's PCP wants to change patient from [place] to [place] and wants Dr. Mallory Shirk advice on if patient should change medications.  Wife stated patient is still taking [place] and not taking Jardiance.

## 2024-01-06 NOTE — Telephone Encounter (Signed)
 Tobb, Kardie, DO    This is the same medication group.  He can only take 1 all of the other.  I am fine either way.    Alda Ponder (dpr) the information above. She is aware that this is what the [employer] Prefers, but she does not feel it is right for the insurance to dictate what medication the patient takes. They do not know if this medication is going to react differently in his system or cause side effects. She doesn't like that he has to switch meds based on insurance and not what the provider feels is best.  Also, she states that the dose of Marcelline Deist is 10 mg and he is being changed to Jardiance 25 mg. She does not understand why the dose is being increased. If they are about the same, why is the dose changing. She does not know of any lab work that was done on the patient, that would cause or indicate that the dosage needs to be changed. So she wants an explanation of why the dose is increased as well.  She would prefer a return call from a Dr or Provider from time to time instead of the Nurse. To be able to speak directly to the provider herself.

## 2024-01-06 NOTE — Telephone Encounter (Addendum)
 Spoke with patient's wife on 01/04/2024 at 4:45pm regarding medication change and advised her of Dr. Carollee Massed annotation below. She verbalized understanding and stated she would contact her insurance company.   Garnette Gunner, MD to Me  [facility] Team     01/04/24  4:34 PM Marcelline Deist was changed to [place].  This was due to insurance preference.  Both medications are in the same family and works similarly.

## 2024-01-06 NOTE — Telephone Encounter (Signed)
 Wife Rinaldo Cloud) called to follow-up on patient's medication change.

## 2024-01-07 ENCOUNTER — Telehealth: Payer: Self-pay

## 2024-01-07 ENCOUNTER — Other Ambulatory Visit: Payer: Self-pay

## 2024-01-07 MED ORDER — AMLODIPINE BESYLATE 2.5 MG PO TABS: 2.5000 mg | ORAL_TABLET | Freq: Every day | ORAL | 2 refills | Status: DC

## 2024-01-07 MED ORDER — AMLODIPINE BESYLATE 5 MG PO TABS: 5.0000 mg | ORAL_TABLET | Freq: Every day | ORAL | 2 refills | Status: AC

## 2024-01-07 NOTE — Telephone Encounter (Signed)
 Please advise, see below.

## 2024-01-07 NOTE — Telephone Encounter (Signed)
 Forwarding message below

## 2024-01-07 NOTE — Telephone Encounter (Signed)
 Copied from CRM [id number]. Topic: Clinical - Prescription Issue >> Jan 07, 2024  2:22 PM Florestine Avers wrote: Reason for CRM: Patient's spouse Garlan Drewes is calling because she wants for patient's medication to be changed from [place] back to [place], She doesn't understand why it was changed and would like to speak with Dr.Thompson.Patients wife stated that she contacted the insurance and they said the Marcelline Deist is okay for the medication to be covered. Patients wife is requesting a call back.

## 2024-01-07 NOTE — Telephone Encounter (Signed)
 Copied from CRM [id number]. Topic: Clinical - Medication Question >> Jan 07, 2024  9:23 AM [name] wrote: Reason for CRM: Patient's spouse Michael Hudson is calling because she wants for patient's medication to be changed from [place] back to [place], She doesn't understand why it was changed and would like to speak with Dr.Thompson.

## 2024-01-09 ENCOUNTER — Other Ambulatory Visit: Payer: Self-pay | Admitting: Family Medicine

## 2024-01-10 ENCOUNTER — Telehealth: Payer: Self-pay | Admitting: Family Medicine

## 2024-01-10 DIAGNOSIS — N1832 Chronic kidney disease, stage 3b: Secondary | ICD-10-CM

## 2024-01-10 NOTE — Telephone Encounter (Signed)
 Duplicate encounter. Spoke with Dr. Hildy Lowers and he stated he would reach out today.

## 2024-01-10 NOTE — Telephone Encounter (Signed)
 Noted.

## 2024-01-10 NOTE — Telephone Encounter (Signed)
 Pt returning call

## 2024-01-10 NOTE — Telephone Encounter (Signed)
 Pts wife Dina Francisco calling again to speak to someone. She expected a call today in the am. I assured that Dr  Hildy Lowers does not arrive until 1:00.Please call

## 2024-01-11 NOTE — Telephone Encounter (Signed)
 Forwarding message below

## 2024-01-11 NOTE — Telephone Encounter (Signed)
 Copied from CRM [id number]. Topic: General - Other >> Jan 10, 2024  5:09 PM Luane Rumps D wrote: Reason for CRM: Pt's wife returning call back from Dr. Hildy Lowers.

## 2024-01-12 NOTE — Telephone Encounter (Unsigned)
 Copied from CRM [id number]. Topic: Clinical - Prescription Issue >> Jan 12, 2024  4:58 PM Orien Bird wrote: Reason for CRM: Wife is calling about husband medication and stated she is tired of doing the back and forth call please call patient. Pt wife is very upset.

## 2024-01-13 ENCOUNTER — Inpatient Hospital Stay

## 2024-01-13 MED ORDER — DAPAGLIFLOZIN PROPANEDIOL 10 MG PO TABS: 10.0000 mg | ORAL_TABLET | Freq: Every day | ORAL | 3 refills | Status: AC

## 2024-01-13 NOTE — Telephone Encounter (Signed)
 Spoke with patient's wife, Dina Francisco, she declined appointment.  She stated that her and the patient would prefer if patient stayed on farxiga and would like it sent to the pharmacy.

## 2024-01-13 NOTE — Addendum Note (Signed)
 Addended by: Catheryn Cluck on: 01/13/2024 08:32 AM   Modules accepted: Orders

## 2024-01-14 ENCOUNTER — Inpatient Hospital Stay: Attending: Hematology & Oncology

## 2024-01-14 DIAGNOSIS — D530 Protein deficiency anemia: Secondary | ICD-10-CM

## 2024-01-14 DIAGNOSIS — N1832 Chronic kidney disease, stage 3b: Secondary | ICD-10-CM | POA: Insufficient documentation

## 2024-01-14 DIAGNOSIS — N400 Enlarged prostate without lower urinary tract symptoms: Secondary | ICD-10-CM | POA: Insufficient documentation

## 2024-01-14 DIAGNOSIS — E1122 Type 2 diabetes mellitus with diabetic chronic kidney disease: Secondary | ICD-10-CM | POA: Diagnosis not present

## 2024-01-14 DIAGNOSIS — D509 Iron deficiency anemia, unspecified: Secondary | ICD-10-CM | POA: Insufficient documentation

## 2024-01-14 DIAGNOSIS — N183 Chronic kidney disease, stage 3 unspecified: Secondary | ICD-10-CM

## 2024-01-14 LAB — CMP (CANCER CENTER ONLY)
ALT: 14 U/L (ref 0–44)
AST: 15 U/L (ref 15–41)
Albumin: 4.5 g/dL (ref 3.5–5.0)
Alkaline Phosphatase: 62 U/L (ref 38–126)
Anion gap: 10 (ref 5–15)
BUN: 16 mg/dL (ref 8–23)
CO2: 27 mmol/L (ref 22–32)
Calcium: 9.5 mg/dL (ref 8.9–10.3)
Chloride: 99 mmol/L (ref 98–111)
Creatinine: 1.53 mg/dL — ABNORMAL HIGH (ref 0.61–1.24)
GFR, Estimated: 45 mL/min — ABNORMAL LOW (ref >60)
Glucose, Bld: 177 mg/dL — ABNORMAL HIGH (ref 70–99)
Potassium: 3.9 mmol/L (ref 3.5–5.1)
Sodium: 136 mmol/L (ref 135–145)
Total Bilirubin: 0.5 mg/dL (ref 0.0–1.2)
Total Protein: 7.5 g/dL (ref 6.5–8.1)

## 2024-01-14 LAB — CBC WITH DIFFERENTIAL (CANCER CENTER ONLY)
Abs Immature Granulocytes: 0.04 10*3/uL (ref 0.00–0.07)
Basophils Absolute: 0 10*3/uL (ref 0.0–0.1)
Basophils Relative: 1 %
Eosinophils Absolute: 0.3 10*3/uL (ref 0.0–0.5)
Eosinophils Relative: 5 %
HCT: 40.9 % (ref 39.0–52.0)
Hemoglobin: 13.7 g/dL (ref 13.0–17.0)
Immature Granulocytes: 1 %
Lymphocytes Relative: 37 %
Lymphs Abs: 2.1 10*3/uL (ref 0.7–4.0)
MCH: 28.5 pg (ref 26.0–34.0)
MCHC: 33.5 g/dL (ref 30.0–36.0)
MCV: 85.2 fL (ref 80.0–100.0)
Monocytes Absolute: 0.4 10*3/uL (ref 0.1–1.0)
Monocytes Relative: 6 %
Neutro Abs: 2.8 10*3/uL (ref 1.7–7.7)
Neutrophils Relative %: 50 %
Platelet Count: 238 10*3/uL (ref 150–400)
RBC: 4.8 MIL/uL (ref 4.22–5.81)
RDW: 13.8 % (ref 11.5–15.5)
WBC Count: 5.7 10*3/uL (ref 4.0–10.5)
nRBC: 0 % (ref 0.0–0.2)

## 2024-01-14 LAB — IRON AND IRON BINDING CAPACITY (CC-WL,HP ONLY)
Iron: 82 ug/dL (ref 45–182)
Saturation Ratios: 24 % (ref 17.9–39.5)
TIBC: 349 ug/dL (ref 250–450)
UIBC: 267 ug/dL (ref 117–376)

## 2024-01-14 LAB — HEMOGLOBIN A1C
Hgb A1c MFr Bld: 7.5 % — ABNORMAL HIGH (ref 4.8–5.6)
Mean Plasma Glucose: 168.55 mg/dL

## 2024-01-14 LAB — RETICULOCYTES
Immature Retic Fract: 16.7 % — ABNORMAL HIGH (ref 2.3–15.9)
RBC.: 4.75 MIL/uL (ref 4.22–5.81)
Retic Count, Absolute: 63.2 10*3/uL (ref 19.0–186.0)
Retic Ct Pct: 1.3 % (ref 0.4–3.1)

## 2024-01-14 LAB — FERRITIN: Ferritin: 34 ng/mL (ref 24–336)

## 2024-01-17 ENCOUNTER — Other Ambulatory Visit: Payer: Self-pay

## 2024-01-17 ENCOUNTER — Other Ambulatory Visit

## 2024-01-17 ENCOUNTER — Encounter: Payer: Self-pay | Admitting: Hematology & Oncology

## 2024-01-17 ENCOUNTER — Inpatient Hospital Stay

## 2024-01-17 ENCOUNTER — Inpatient Hospital Stay: Payer: Medicare PPO

## 2024-01-17 ENCOUNTER — Inpatient Hospital Stay: Payer: Medicare PPO | Admitting: Hematology & Oncology

## 2024-01-17 VITALS — BP 140/70 | HR 40 | Temp 98.5°F | Resp 18 | Ht 69.5 in | Wt 189.0 lb

## 2024-01-17 DIAGNOSIS — N1832 Chronic kidney disease, stage 3b: Secondary | ICD-10-CM | POA: Diagnosis not present

## 2024-01-17 DIAGNOSIS — E1122 Type 2 diabetes mellitus with diabetic chronic kidney disease: Secondary | ICD-10-CM | POA: Diagnosis not present

## 2024-01-17 DIAGNOSIS — D6489 Other specified anemias: Secondary | ICD-10-CM

## 2024-01-17 DIAGNOSIS — Z125 Encounter for screening for malignant neoplasm of prostate: Secondary | ICD-10-CM

## 2024-01-17 DIAGNOSIS — D509 Iron deficiency anemia, unspecified: Secondary | ICD-10-CM | POA: Diagnosis not present

## 2024-01-17 DIAGNOSIS — N400 Enlarged prostate without lower urinary tract symptoms: Secondary | ICD-10-CM | POA: Diagnosis not present

## 2024-01-17 NOTE — Progress Notes (Signed)
 Hematology and Oncology Follow Up Visit  Michael Hudson [id number] Dec 15, 1940 83 y.o. 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  This is his second office visit.  We first saw him back in early January.  At that time, he had a history of iron deficiency.  Back in September 2024, his ferritin was 14 with a iron saturation that was not measured.  He was put on oral iron along with vitamin C.  This really has helped his hemoglobin.  When we first saw him, his hemoglobin was 13 with hematocrit of 38.9.  Back in September 2024, his hemoglobin was 10.7 with hematocrit of 33.6.  Of note, his erythropoietin  level was only 25.  He is doing well with the oral iron.  He is tolerating this pretty well.  It clearly is helping his hemoglobin..  Otherwise, he is doing all right.  He has had no problems with bleeding.  Is no change in bowel or bladder habits.  He has had no problems with cough or shortness of breath.  There has been no problems with COVID or Influenza.  Overall, I will have to say that his performance status probably ECOG 2.  Medications:  Current Outpatient Medications:    amLODipine  (NORVASC ) 2.5 MG tablet, Take 1 tablet (2.5 mg total) by mouth daily. To be taken with 5 mg tablet for total of 7.5 mg daily, Disp: 90 tablet, Rfl: 2   amLODipine  (NORVASC ) 5 MG tablet, Take 1 tablet (5 mg total) by mouth daily., Disp: 90 tablet, Rfl: 2   Ascorbic Acid (VITAMIN C PO), Take by mouth. daily, Disp: , Rfl:    aspirin  EC 81 MG tablet, Take 81 mg by mouth daily., Disp: , Rfl:    ciclopirox  (PENLAC ) 8 % solution, Apply topically at bedtime. Apply over nail and surrounding skin. Apply daily over previous coat. After seven (7) days, may remove with alcohol and continue cycle., Disp: 6.6 mL, Rfl: 0   clobetasol  ointment  (TEMOVATE ) 0.05 %, Apply 1 Application topically 2 (two) times daily., Disp: 30 g, Rfl: 0   Cyanocobalamin  2500 MCG CHEW, Chew 2,500 mcg by mouth daily., Disp: , Rfl:    dapagliflozin  propanediol (FARXIGA ) 10 MG TABS tablet, Take 1 tablet (10 mg total) by mouth daily before breakfast., Disp: 90 tablet, Rfl: 3   Ferrous Sulfate (IRON) 325 (65 Fe) MG TABS, Take 1 tablet by mouth daily., Disp: , Rfl:    furosemide  (LASIX ) 20 MG tablet, Take 1 tablet (20 mg total) by mouth daily., Disp: 90 tablet, Rfl: 3   glipiZIDE (GLUCOTROL) 10 MG tablet, TAKE 1 TABLET BY MOUTH TWICE A DAY BEFORE MEALS, Disp: 180 tablet, Rfl: 3   losartan  (COZAAR ) 100 MG tablet, TAKE 1 TABLET BY MOUTH EVERY DAY, Disp: 90 tablet, Rfl: 0   metFORMIN  (GLUCOPHAGE ) 500 MG tablet, Take 2 tablets (1,000 mg total) by mouth 2 (two) times daily with a meal., Disp: 360 tablet, Rfl: 1   metoprolol  succinate (TOPROL -XL) 100 MG 24 hr tablet, Take 1.5 tablets (150 mg total) by mouth daily. Take with or immediately following a meal.TAKE 1 AND 1/2 TABLETS BY MOUTH EVERY DAY TAKE WITH OR IMMEDIATELY FOLLOWING A MEAL, Disp: 135 tablet, Rfl: 0   ONETOUCH VERIO test strip, USE 1 STRIP THREE TIMES DAILY AS DIRECTED, Disp: 300 strip, Rfl: 0  potassium chloride  (KLOR-CON ) 10 MEQ tablet, Take 2 tablets (20 mEq total) by mouth daily., Disp: 180 tablet, Rfl: 3   rosuvastatin  (CRESTOR ) 20 MG tablet, Take 1 tablet (20 mg total) by mouth daily., Disp: 90 tablet, Rfl: 3  Allergies:  Allergies  Allergen Reactions   Shellfish Allergy Nausea And Vomiting   Neosporin [Neomycin-Bacitracin Zn-Polymyx] Rash   Zinc Rash    Past Medical History, Surgical history, Social history, and Family History were reviewed and updated.  Review of Systems: Review of Systems  Constitutional: Negative.   HENT:  Negative.    Eyes: Negative.   Respiratory: Negative.    Cardiovascular: Negative.   Gastrointestinal: Negative.   Endocrine: Negative.   Genitourinary: Negative.     Musculoskeletal: Negative.   Skin: Negative.   Neurological: Negative.   Hematological: Negative.   Psychiatric/Behavioral: Negative.      Physical Exam:  height is 5' 9.5" (1.765 m) and weight is 189 lb (85.7 kg). His oral temperature is 98.5 F (36.9 C). His blood pressure is 140/70 (abnormal) and his pulse is 40 (abnormal). His respiration is 18 and oxygen saturation is 99%.   Wt Readings from Last 3 Encounters:  01/17/24 189 lb (85.7 kg)  11/09/23 190 lb (86.2 kg)  10/18/23 191 lb (86.6 kg)    Physical Exam Vitals reviewed.  HENT:     Head: Normocephalic and atraumatic.  Eyes:     Pupils: Pupils are equal, round, and reactive to light.  Cardiovascular:     Rate and Rhythm: Normal rate and regular rhythm.     Heart sounds: Normal heart sounds.  Pulmonary:     Effort: Pulmonary effort is normal.     Breath sounds: Normal breath sounds.  Abdominal:     General: Bowel sounds are normal.     Palpations: Abdomen is soft.  Musculoskeletal:        General: No tenderness or deformity. Normal range of motion.     Cervical back: Normal range of motion.  Lymphadenopathy:     Cervical: No cervical adenopathy.  Skin:    General: Skin is warm and dry.     Findings: No erythema or rash.  Neurological:     Mental Status: He is alert and oriented to person, place, and time.  Psychiatric:        Behavior: Behavior normal.        Thought Content: Thought content normal.        Judgment: Judgment normal.      Lab Results  Component Value Date   WBC 5.7 01/14/2024   HGB 13.7 01/14/2024   HCT 40.9 01/14/2024   MCV 85.2 01/14/2024   PLT 238 01/14/2024     Chemistry      Component Value Date/Time   NA 136 01/14/2024 1056   NA 141 09/26/2021 0000   K 3.9 01/14/2024 1056   CL 99 01/14/2024 1056   CO2 27 01/14/2024 1056   BUN 16 01/14/2024 1056   BUN 14 09/26/2021 0000   CREATININE 1.53 (H) 01/14/2024 1056   GLU 119 09/26/2021 0000      Component Value Date/Time    CALCIUM  9.5 01/14/2024 1056   ALKPHOS 62 01/14/2024 1056   AST 15 01/14/2024 1056   ALT 14 01/14/2024 1056   BILITOT 0.5 01/14/2024 1056      Impression and Plan: 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  He and his wife are very delightful to talk to.  His hemoglobin  continues to improve.  As such, I just do not think that there is anything that we need to do right now.  As long as he is doing well the oral iron and vitamin C, then there is no need to do anything different.  His erythropoietin  level is on the lower side for his hemoglobin so there is some that we can certainly watch for.  I will plan to get him back in another 3 months.  As always, we will get lab work before we see him back.  He would like to have his PSA checked.  We can do this.   Ivor Mars, MD 4/21/20252:19 PM

## 2024-01-18 ENCOUNTER — Encounter: Payer: Self-pay | Admitting: *Deleted

## 2024-01-18 LAB — PSA, TOTAL AND FREE
PSA, Free Pct: 55.7 %
PSA, Free: 0.39 ng/mL
Prostate Specific Ag, Serum: 0.7 ng/mL (ref 0.0–4.0)

## 2024-01-27 ENCOUNTER — Other Ambulatory Visit: Payer: Self-pay | Admitting: Family Medicine

## 2024-02-03 ENCOUNTER — Other Ambulatory Visit: Payer: Self-pay | Admitting: Family Medicine

## 2024-02-03 DIAGNOSIS — E119 Type 2 diabetes mellitus without complications: Secondary | ICD-10-CM

## 2024-02-03 MED ORDER — ONETOUCH VERIO VI STRP: ORAL_STRIP | 0 refills | Status: DC

## 2024-02-03 NOTE — Telephone Encounter (Signed)
 Copied from CRM [id number]. Topic: Clinical - Medication Refill >> Feb 03, 2024  1:37 PM Michael Hudson wrote: Medication: ONETOUCH VERIO test strip  Has the patient contacted their pharmacy? Yes (Agent: If no, request that the patient contact the pharmacy for the refill. If patient does not wish to contact the pharmacy document the reason why and proceed with request.) (Agent: If yes, when and what did the pharmacy advise?)  This is the patient's preferred pharmacy:  [id number] - [place], [place] - [address] [place] [id number] Phone: [phone] Fax: [phone]   Is this the correct pharmacy for this prescription? Yes If no, delete pharmacy and type the correct one.   Has the prescription been filled recently? Yes  Is the patient out of the medication? Yes  Has the patient been seen for an appointment in the last year OR does the patient have an upcoming appointment? Yes  Can we respond through MyChart? Yes  Agent: Please be advised that Rx refills may take up to 3 business days. We ask that you follow-up with your pharmacy.

## 2024-02-03 NOTE — Telephone Encounter (Signed)
Rx sent to correct pharmacy as requested.

## 2024-02-15 ENCOUNTER — Encounter: Payer: Self-pay | Admitting: Family Medicine

## 2024-02-15 ENCOUNTER — Ambulatory Visit: Payer: Medicare PPO | Admitting: Family Medicine

## 2024-02-15 VITALS — BP 126/74 | HR 68 | Temp 96.5°F | Resp 18 | Wt 190.0 lb

## 2024-02-15 DIAGNOSIS — E1122 Type 2 diabetes mellitus with diabetic chronic kidney disease: Secondary | ICD-10-CM | POA: Diagnosis not present

## 2024-02-15 DIAGNOSIS — N1832 Chronic kidney disease, stage 3b: Secondary | ICD-10-CM

## 2024-02-15 DIAGNOSIS — E1159 Type 2 diabetes mellitus with other circulatory complications: Secondary | ICD-10-CM | POA: Diagnosis not present

## 2024-02-15 DIAGNOSIS — E538 Deficiency of other specified B group vitamins: Secondary | ICD-10-CM

## 2024-02-15 DIAGNOSIS — I2510 Atherosclerotic heart disease of native coronary artery without angina pectoris: Secondary | ICD-10-CM

## 2024-02-15 DIAGNOSIS — I152 Hypertension secondary to endocrine disorders: Secondary | ICD-10-CM

## 2024-02-15 DIAGNOSIS — Z7984 Long term (current) use of oral hypoglycemic drugs: Secondary | ICD-10-CM | POA: Diagnosis not present

## 2024-02-15 NOTE — Patient Instructions (Addendum)
  VISIT SUMMARY: You had a follow-up visit to review your diabetes management, kidney function, blood pressure, and other health concerns. Your diabetes is well-managed with an A1c of 7.5%, and your kidney function and blood pressure are stable. We also discussed restarting your B12 supplementation.  YOUR PLAN: -TYPE 2 DIABETES MELLITUS WITH HYPERGLYCEMIA: Type 2 diabetes mellitus with hyperglycemia means your blood sugar levels are higher than normal. Your A1c is stable at 7.5%, and your current medications include empagliflozin , metformin , and glipizide as needed. Continue taking empagliflozin  10 mg daily and metformin  1000 mg twice daily. Use glipizide sparingly, 2-3 times a week, for blood sugar spikes. You will also be referred for a diabetic retinopathy screening.  -CHRONIC KIDNEY DISEASE, STAGE 3: Chronic kidney disease, stage 3 means your kidneys are moderately damaged. Your kidney function is stable with a GFR of 45. Continue taking your current medications, including losartan  and empagliflozin , for kidney protection.  -HYPERTENSION: Hypertension means high blood pressure. Your blood pressure is well-controlled at 126/74 mmHg with amlodipine  and losartan . Continue taking amlodipine  7.5 mg daily and losartan  as prescribed.  -CORONARY ARTERY DISEASE: Coronary artery disease means the blood vessels supplying your heart are narrowed or blocked. Your condition is well-managed with your current blood pressure and diabetes medications. Continue with your current management plan to reduce cardiovascular risk.  -B12 DEFICIENCY ANEMIA, RESOLVED: B12 deficiency anemia means you had low levels of vitamin B12, which has now resolved. Restart B12 supplementation as recommended.  INSTRUCTIONS: Please follow up for a diabetic retinopathy screening. Continue with your current medications and restart B12 supplementation as recommended. If you have any new symptoms or concerns, please schedule an  appointment.

## 2024-02-15 NOTE — Progress Notes (Signed)
 Assessment & Plan   Assessment/Plan:    Assessment & Plan Type 2 diabetes mellitus Type 2 diabetes mellitus is well-managed with an A1c of 7.5%. Current regimen includes empagliflozin , metformin , and glipizide as needed. Average blood glucose is 158 mg/dL, with postprandial levels around 188 mg/dL. Glipizide is used sparingly, 2-3 times weekly, primarily in the evening. The target A1c of 7.5% is appropriate given his age and comorbidities, balancing benefits of tighter control with medication side effects. - Continue empagliflozin  10 mg daily. - Continue metformin  1000 mg twice daily. - Use glipizide as needed for blood glucose spikes, typically 2-3 times a week. - Recommend routine diabetic retinopathy screening, states he will schedule on his own  Chronic kidney disease, stage 3b Chronic kidney disease, stage 3, is well-managed with a GFR of 45. Medications include losartan  and empagliflozin  for renal protection. Nephrology referral is deferred due to stable kidney function and his preference to avoid additional specialists unless necessary. - Continue current medications for renal protection.  Hypertension Hypertension is well-controlled with a blood pressure of 126/74 mmHg. Amlodipine  and losartan  effectively manage blood pressure and provide renal protection. He tolerates the regimen without dizziness or other side effects. Tighter control reduces risk of heart disease and kidney dysfunction, avoiding hypotension given age-related vascular changes. - Continue amlodipine  7.5 mg daily. - Continue losartan  as prescribed.  Coronary artery disease Coronary artery disease is well-managed. Blood pressure and diabetes control reduce cardiovascular risk. He is on appropriate medications for these conditions. - Continue current management of hypertension and diabetes to reduce cardiovascular risk.  B12 deficiency anemia, resolved B12 deficiency anemia has resolved. He was previously taken off  B12 supplementation due to high levels but is recommended to restart supplementation as levels have stabilized. - Restart B12 supplementation as recommended.      There are no discontinued medications.  Return in about 3 months (around 05/17/2024) for DM.        Subjective:   Encounter date: 02/15/2024  Michael Hudson is a 83 y.o. male who has Left-sided weakness; Cerebral infarction [facility]); Hypertension associated with type 2 diabetes mellitus (HCC); Diabetes mellitus without complication (HCC); Hyperlipidemia associated with type 2 diabetes mellitus (HCC); Chronic congestive heart failure (HCC); Coronary artery disease involving native heart without angina pectoris; Diabetic foot (HCC); Anemia; Type 2 diabetes mellitus with stage 3b chronic kidney disease, without long-term current use of insulin  (HCC); Psoriasis; B12 deficiency; Cataract; Gastric intestinal metaplasia; Gastric polyps; History of colon polyps; Open angle with borderline findings of both eyes; Primary open angle glaucoma of both eyes, mild stage; and Onychogryposis of toenail on their problem list..   He  has a past medical history of Abnormal EKG (03/25/2016), Benign tumor of skin, CVA (cerebral infarction) (03/24/2016), Diabetes mellitus without complication (HCC), Enlarged prostate, Hypercholesterolemia, Hyperlipidemia, Hypertension, Hypertrophy of prostate, Hypokalemia (03/25/2016), Left-sided weakness (03/24/2016), LVH (left ventricular hypertrophy), Primary osteoarthritis involving multiple joints, TIA (transient ischemic attack) (03/24/2016), and Type 2 diabetes mellitus without complication, without long-term current use of insulin  (HCC).[name]   He presents with chief complaint of Diabetes (3 month follow up. Pt is fasting today; A1c done on 01/14/2024 (oncology clinic) 7.5 result//HM due- diabetic eye exam (referral is needed) and shingles vaccine ) .   Discussed the use of AI scribe software for clinical note  transcription with the patient, who gave verbal consent to proceed.  History of Present Illness Michael Hudson is an 83 year old male with diabetes who presents for a follow-up visit.  His  last A1c was stable at 7.5 as of January 14, 2024. He reports no new symptoms and feels the same as before. He is currently taking amlodipine  7.5 mg, empagliflozin  10 mg, metformin  1000 mg twice daily, and glipizide as needed, typically 2-3 times a week, usually in the evening. His blood sugar levels have been averaging 158 over the past seven days, with pre-meal levels around 155 and post-meal levels around 188. He uses glipizide when his blood sugar exceeds 200, often cutting the pill in half to manage his levels.  He has a history of B12 deficiency and anemia, which have improved. He was previously taken off B12 supplements due to high levels. No change in symptoms related to anemia or B12 deficiency is noted.  He is also on losartan  for kidney protection, with kidney function being stable. He has not seen a kidney specialist before and has no current plans to do so.  He manages seasonal allergies with cetirizine as needed and reports no significant allergy symptoms currently.  No chest pain, shortness of breath, or dizziness.       Past Surgical History:  Procedure Laterality Date   UPPER GI ENDOSCOPY  08/17/2022   See scanned document    Outpatient Medications Prior to Visit  Medication Sig Dispense Refill   amLODipine  (NORVASC ) 2.5 MG tablet Take 1 tablet (2.5 mg total) by mouth daily. To be taken with 5 mg tablet for total of 7.5 mg daily 90 tablet 2   amLODipine  (NORVASC ) 5 MG tablet Take 1 tablet (5 mg total) by mouth daily. 90 tablet 2   Ascorbic Acid (VITAMIN C PO) Take by mouth. daily     aspirin  EC 81 MG tablet Take 81 mg by mouth daily.     ciclopirox  (PENLAC ) 8 % solution Apply topically at bedtime. Apply over nail and surrounding skin. Apply daily over previous coat. After seven (7)  days, may remove with alcohol and continue cycle. 6.6 mL 0   clobetasol  ointment (TEMOVATE ) 0.05 % Apply 1 Application topically 2 (two) times daily. 30 g 0   Cyanocobalamin  2500 MCG CHEW Chew 2,500 mcg by mouth daily.     dapagliflozin  propanediol (FARXIGA ) 10 MG TABS tablet Take 1 tablet (10 mg total) by mouth daily before breakfast. 90 tablet 3   Ferrous Sulfate (IRON) 325 (65 Fe) MG TABS Take 1 tablet by mouth daily.     furosemide  (LASIX ) 20 MG tablet Take 1 tablet (20 mg total) by mouth daily. 90 tablet 3   glipiZIDE (GLUCOTROL) 10 MG tablet TAKE 1 TABLET BY MOUTH TWICE A DAY BEFORE MEALS 180 tablet 3   glucose blood (ONETOUCH VERIO) test strip Use as instructed 300 strip 0   losartan  (COZAAR ) 100 MG tablet TAKE 1 TABLET BY MOUTH EVERY DAY 90 tablet 0   metFORMIN  (GLUCOPHAGE ) 500 MG tablet Take 2 tablets (1,000 mg total) by mouth 2 (two) times daily with a meal. 360 tablet 1   metoprolol  succinate (TOPROL -XL) 100 MG 24 hr tablet TAKE 1 AND 1/2 TABLETS (150MG ) BY MOUTH DAILY IMMEDIATELY FOLLOWING A MEAL 135 tablet 0   potassium chloride  (KLOR-CON ) 10 MEQ tablet Take 2 tablets (20 mEq total) by mouth daily. 180 tablet 3   rosuvastatin  (CRESTOR ) 20 MG tablet Take 1 tablet (20 mg total) by mouth daily. 90 tablet 3   No facility-administered medications prior to visit.    Family History  Problem Relation Age of Onset   Other Mother  natural cases   Other Father        hx unkown   Healthy Sister    Healthy Brother    Healthy Brother     Social History   Socioeconomic History   Marital status: Married    Spouse name: Not on file   Number of children: Not on file   Years of education: Not on file   Highest education level: Not on file  Occupational History   Not on file  Tobacco Use   Smoking status: Never    Passive exposure: Never   Smokeless tobacco: Never  Vaping Use   Vaping status: Never Used  Substance and Sexual Activity   Alcohol use: No   Drug use: No    Sexual activity: Not on file    Comment: married  Other Topics Concern   Not on file  Social History Narrative   Not on file   Social Drivers of Health   Financial Resource Strain: Low Risk  (09/06/2023)   Overall Financial Resource Strain (CARDIA)    Difficulty of Paying Living Expenses: Not hard at all  Food Insecurity: No Food Insecurity (09/06/2023)   Hunger Vital Sign    Worried About Running Out of Food in the Last Year: Never true    Ran Out of Food in the Last Year: Never true  Transportation Needs: No Transportation Needs (09/06/2023)   PRAPARE - Administrator, Civil Service (Medical): No    Lack of Transportation (Non-Medical): No  Physical Activity: Insufficiently Active (09/06/2023)   Exercise Vital Sign    Days of Exercise per Week: 3 days    Minutes of Exercise per Session: 30 min  Stress: No Stress Concern Present (09/06/2023)   [employer] of Occupational Health - Occupational Stress Questionnaire    Feeling of Stress : Not at all  Social Connections: Moderately Integrated (09/06/2023)   Social Connection and Isolation Panel [NHANES]    Frequency of Communication with Friends and Family: More than three times a week    Frequency of Social Gatherings with Friends and Family: Not on file    Attends Religious Services: More than 4 times per year    Active Member of [employer] or Organizations: No    Attends Banker Meetings: Never    Marital Status: Married  Catering manager Violence: Not At Risk (09/06/2023)   Humiliation, Afraid, Rape, and Kick questionnaire    Fear of Current or Ex-Partner: No    Emotionally Abused: No    Physically Abused: No    Sexually Abused: No                                                                                                  Objective:  Physical Exam: BP 126/74 (BP Location: Left Arm, Patient Position: Sitting, Cuff Size: Large)   Pulse 68   Temp (!) 96.5 F (35.8 C) (Temporal)   Resp 18   Wt  190 lb (86.2 kg)   SpO2 100%   BMI 27.66 kg/m    Physical Exam  GENERAL: Alert, cooperative, well  developed, no acute distress. HEENT: Normocephalic, normal oropharynx, moist mucous membranes. CHEST: Clear to auscultation bilaterally, no wheezes, rhonchi, or crackles. CARDIOVASCULAR: Normal heart rate and rhythm, S1 and S2 normal without murmurs. ABDOMEN: Soft, non-tender, non-distended, without organomegaly, normal bowel sounds. EXTREMITIES: No cyanosis or edema. NEUROLOGICAL: Cranial nerves grossly intact, moves all extremities without gross motor or sensory deficit.     No results found.  Recent Results (from the past 2160 hours)  CBC with Differential (Cancer Center Only)     Status: None   Collection Time: 01/14/24 10:56 AM  Result Value Ref Range   WBC Count 5.7 4.0 - 10.5 K/uL   RBC 4.80 4.22 - 5.81 MIL/uL   Hemoglobin 13.7 13.0 - 17.0 g/dL   HCT 16.1 09.6 - 04.5 %   MCV 85.2 80.0 - 100.0 fL   MCH 28.5 26.0 - 34.0 pg   MCHC 33.5 30.0 - 36.0 g/dL   RDW 40.9 81.1 - 91.4 %   Platelet Count 238 150 - 400 K/uL   nRBC 0.0 0.0 - 0.2 %   Neutrophils Relative % 50 %   Neutro Abs 2.8 1.7 - 7.7 K/uL   Lymphocytes Relative 37 %   Lymphs Abs 2.1 0.7 - 4.0 K/uL   Monocytes Relative 6 %   Monocytes Absolute 0.4 0.1 - 1.0 K/uL   Eosinophils Relative 5 %   Eosinophils Absolute 0.3 0.0 - 0.5 K/uL   Basophils Relative 1 %   Basophils Absolute 0.0 0.0 - 0.1 K/uL   Immature Granulocytes 1 %   Abs Immature Granulocytes 0.04 0.00 - 0.07 K/uL    Comment: Performed at [facility], [address] [place], [place] [address]  CMP (Cancer Center only)     Status: Abnormal   Collection Time: 01/14/24 10:56 AM  Result Value Ref Range   Sodium 136 135 - 145 mmol/L   Potassium 3.9 3.5 - 5.1 mmol/L   Chloride 99 98 - 111 mmol/L   CO2 27 22 - 32 mmol/L   Glucose, Bld 177 (H) 70 - 99 mg/dL    Comment: Glucose reference range applies only to samples taken after fasting for at  least 8 hours.   BUN 16 8 - 23 mg/dL   Creatinine 6.21 (H) 3.08 - 1.24 mg/dL   Calcium  9.5 8.9 - 10.3 mg/dL   Total Protein 7.5 6.5 - 8.1 g/dL   Albumin 4.5 3.5 - 5.0 g/dL   AST 15 15 - 41 U/L   ALT 14 0 - 44 U/L   Alkaline Phosphatase 62 38 - 126 U/L   Total Bilirubin 0.5 0.0 - 1.2 mg/dL   GFR, Estimated 45 (L) >60 mL/min    Comment: (NOTE) Calculated using the CKD-EPI Creatinine Equation (2021)    Anion gap 10 5 - 15    Comment: Performed at [facility], [address] [place], [place] [address]  Reticulocytes     Status: Abnormal   Collection Time: 01/14/24 10:56 AM  Result Value Ref Range   Retic Ct Pct 1.3 0.4 - 3.1 %   RBC. 4.75 4.22 - 5.81 MIL/uL   Retic Count, Absolute 63.2 19.0 - 186.0 K/uL   Immature Retic Fract 16.7 (H) 2.3 - 15.9 %    Comment: Performed at [facility], [address] [place], [place] [address]  Ferritin     Status: None   Collection Time: 01/14/24 10:56 AM  Result  Value Ref Range   Ferritin 34 24 - 336 ng/mL    Comment: Performed at [facility], [address] [place], [place] [address]  Iron and Iron Binding Capacity ([facility] only)     Status: None   Collection Time: 01/14/24 10:56 AM  Result Value Ref Range   Iron 82 45 - 182 ug/dL   TIBC 604 540 - 981 ug/dL   Saturation Ratios 24 17.9 - 39.5 %   UIBC 267 117 - 376 ug/dL    Comment: Performed at [facility], [address] [place], [place] [address]  Hemoglobin A1c     Status: Abnormal   Collection Time: 01/14/24 10:57 AM  Result Value Ref Range   Hgb A1c MFr Bld 7.5 (H) 4.8 - 5.6 %    Comment: (NOTE) Pre diabetes:          5.7%-6.4%  Diabetes:              >6.4%  Glycemic control for   <7.0% adults with diabetes    Mean Plasma Glucose 168.55 mg/dL    Comment: Performed at [facility], [address] [place], [place] [address]  PSA, total and free     Status: None    Collection Time: 01/17/24  2:24 PM  Result Value Ref Range   PSA, Free 0.39 N/A ng/mL    Comment: Roche ECLIA methodology.   PSA, Free Pct 55.7 %    Comment: (NOTE) The table below lists the probability of prostate cancer for men with non-suspicious DRE results and total PSA between 4 and 10 ng/mL, by patient age Kalvin Orf, JAMA 1998, 829:5621).                  % Free PSA       50-64 yr        65-75 yr                  0.00-10.00%        56%             55%                 10.01-15.00%        24%             35%                 15.01-20.00%        17%             23%                 20.01-25.00%        10%             20%                      >25.00%         5%              9% Please note:  Catalona et al did not make specific              recommendations regarding the use of              percent free PSA for any other population              of men. Performed At: [facility] [address] [place], [place] [id number] Pearlean Botts MD  ZO:[id number]    Prostate Specific Ag, Serum 0.7 0.0 - 4.0 ng/mL    Comment: (NOTE) Roche ECLIA methodology. According to the American Urological Association, Serum PSA should decrease and remain at undetectable levels after radical prostatectomy. The AUA defines biochemical recurrence as an initial PSA value 0.2 ng/mL or greater followed by a subsequent confirmatory PSA value 0.2 ng/mL or greater. Values obtained with different assay methods or kits cannot be used interchangeably. Results cannot be interpreted as absolute evidence of the presence or absence of malignant disease.         Carnell Christian, MD, MS

## 2024-04-18 ENCOUNTER — Inpatient Hospital Stay: Attending: Hematology & Oncology

## 2024-04-18 DIAGNOSIS — D509 Iron deficiency anemia, unspecified: Secondary | ICD-10-CM | POA: Diagnosis not present

## 2024-04-18 DIAGNOSIS — N1832 Chronic kidney disease, stage 3b: Secondary | ICD-10-CM | POA: Diagnosis not present

## 2024-04-18 DIAGNOSIS — E1122 Type 2 diabetes mellitus with diabetic chronic kidney disease: Secondary | ICD-10-CM | POA: Diagnosis not present

## 2024-04-18 DIAGNOSIS — D6489 Other specified anemias: Secondary | ICD-10-CM

## 2024-04-18 LAB — RETICULOCYTES
Immature Retic Fract: 17.9 % — ABNORMAL HIGH (ref 2.3–15.9)
RBC.: 4.88 MIL/uL (ref 4.22–5.81)
Retic Count, Absolute: 84.9 K/uL (ref 19.0–186.0)
Retic Ct Pct: 1.7 % (ref 0.4–3.1)

## 2024-04-18 LAB — IRON AND IRON BINDING CAPACITY (CC-WL,HP ONLY)
Iron: 93 ug/dL (ref 45–182)
Saturation Ratios: 24 % (ref 17.9–39.5)
TIBC: 392 ug/dL (ref 250–450)
UIBC: 299 ug/dL

## 2024-04-18 LAB — CBC WITH DIFFERENTIAL (CANCER CENTER ONLY)
Abs Immature Granulocytes: 0.02 K/uL (ref 0.00–0.07)
Basophils Absolute: 0 K/uL (ref 0.0–0.1)
Basophils Relative: 1 %
Eosinophils Absolute: 0.3 K/uL (ref 0.0–0.5)
Eosinophils Relative: 5 %
HCT: 42.3 % (ref 39.0–52.0)
Hemoglobin: 13.7 g/dL (ref 13.0–17.0)
Immature Granulocytes: 0 %
Lymphocytes Relative: 37 %
Lymphs Abs: 2.5 K/uL (ref 0.7–4.0)
MCH: 28 pg (ref 26.0–34.0)
MCHC: 32.4 g/dL (ref 30.0–36.0)
MCV: 86.5 fL (ref 80.0–100.0)
Monocytes Absolute: 0.4 K/uL (ref 0.1–1.0)
Monocytes Relative: 7 %
Neutro Abs: 3.3 K/uL (ref 1.7–7.7)
Neutrophils Relative %: 50 %
Platelet Count: 223 K/uL (ref 150–400)
RBC: 4.89 MIL/uL (ref 4.22–5.81)
RDW: 14.4 % (ref 11.5–15.5)
WBC Count: 6.6 K/uL (ref 4.0–10.5)
nRBC: 0 % (ref 0.0–0.2)

## 2024-04-18 LAB — CMP (CANCER CENTER ONLY)
ALT: 34 U/L (ref 0–44)
AST: 25 U/L (ref 15–41)
Albumin: 4.7 g/dL (ref 3.5–5.0)
Alkaline Phosphatase: 80 U/L (ref 38–126)
Anion gap: 14 (ref 5–15)
BUN: 14 mg/dL (ref 8–23)
CO2: 24 mmol/L (ref 22–32)
Calcium: 10.1 mg/dL (ref 8.9–10.3)
Chloride: 101 mmol/L (ref 98–111)
Creatinine: 1.63 mg/dL — ABNORMAL HIGH (ref 0.61–1.24)
GFR, Estimated: 42 mL/min — ABNORMAL LOW (ref >60)
Glucose, Bld: 148 mg/dL — ABNORMAL HIGH (ref 70–99)
Potassium: 4.4 mmol/L (ref 3.5–5.1)
Sodium: 139 mmol/L (ref 135–145)
Total Bilirubin: 0.5 mg/dL (ref 0.0–1.2)
Total Protein: 8.2 g/dL — ABNORMAL HIGH (ref 6.5–8.1)

## 2024-04-18 LAB — FERRITIN: Ferritin: 93 ng/mL (ref 24–336)

## 2024-04-19 ENCOUNTER — Other Ambulatory Visit: Payer: Self-pay | Admitting: Family Medicine

## 2024-04-24 ENCOUNTER — Ambulatory Visit: Admitting: Hematology & Oncology

## 2024-04-27 ENCOUNTER — Other Ambulatory Visit: Payer: Self-pay

## 2024-04-27 ENCOUNTER — Encounter: Payer: Self-pay | Admitting: Hematology & Oncology

## 2024-04-27 ENCOUNTER — Inpatient Hospital Stay: Admitting: Hematology & Oncology

## 2024-04-27 VITALS — BP 132/75 | HR 75 | Temp 98.1°F | Resp 16 | Ht 69.5 in | Wt 190.0 lb

## 2024-04-27 DIAGNOSIS — N1832 Chronic kidney disease, stage 3b: Secondary | ICD-10-CM | POA: Diagnosis not present

## 2024-04-27 DIAGNOSIS — E1122 Type 2 diabetes mellitus with diabetic chronic kidney disease: Secondary | ICD-10-CM | POA: Diagnosis not present

## 2024-04-27 DIAGNOSIS — D6489 Other specified anemias: Secondary | ICD-10-CM

## 2024-04-27 DIAGNOSIS — D509 Iron deficiency anemia, unspecified: Secondary | ICD-10-CM | POA: Diagnosis not present

## 2024-04-27 NOTE — Progress Notes (Signed)
 Hematology and Oncology Follow Up Visit  Michael Hudson [id number] 07-17-1941 83 y.o. 04/27/2024   Principle Diagnosis:  Iron deficiency anemia Anemia of renal insufficiency-erythropoietin  deficiency   Current Therapy:   Oral iron/vitamin C daily     Interim History:  Michael Hudson is back for follow-up.  We last saw him back on January 17, 2024.  Since then, he has been doing pretty well.  He really has had no complaints.  We always get his lab a week before we see him.  His iron studies showed a ferritin of 93 with an iron saturation of 24%.  His electrolytes all looked okay.  He has some mild renal insufficiency with a BUN of 14 and creatinine 1.63.  I think he is taking some oral iron.  He is doing well with this.  He has had no problems with constipation.  Has been no issues with nausea or vomiting.  He has had no rashes.  There is been no bleeding.  There is been no leg swelling.  He has had no fever.  Thankfully, there is been no exposures to COVID.  Overall, I would say that his performance status is probably ECOG 1.    Medications:  Current Outpatient Medications:    amLODipine  (NORVASC ) 2.5 MG tablet, Take 1 tablet (2.5 mg total) by mouth daily. To be taken with 5 mg tablet for total of 7.5 mg daily, Disp: 90 tablet, Rfl: 2   amLODipine  (NORVASC ) 5 MG tablet, Take 1 tablet (5 mg total) by mouth daily., Disp: 90 tablet, Rfl: 2   Ascorbic Acid (VITAMIN C PO), Take by mouth. daily, Disp: , Rfl:    aspirin  EC 81 MG tablet, Take 81 mg by mouth daily., Disp: , Rfl:    ciclopirox  (PENLAC ) 8 % solution, Apply topically at bedtime. Apply over nail and surrounding skin. Apply daily over previous coat. After seven (7) days, may remove with alcohol and continue cycle., Disp: 6.6 mL, Rfl: 0   clobetasol  ointment (TEMOVATE ) 0.05 %, Apply 1 Application topically 2 (two) times daily., Disp: 30 g, Rfl: 0   Cyanocobalamin  2500 MCG CHEW, Chew 2,500 mcg by mouth daily., Disp: , Rfl:     dapagliflozin  propanediol (FARXIGA ) 10 MG TABS tablet, Take 1 tablet (10 mg total) by mouth daily before breakfast., Disp: 90 tablet, Rfl: 3   Ferrous Sulfate (IRON) 325 (65 Fe) MG TABS, Take 1 tablet by mouth daily., Disp: , Rfl:    furosemide  (LASIX ) 20 MG tablet, Take 1 tablet (20 mg total) by mouth daily., Disp: 90 tablet, Rfl: 3   glipiZIDE (GLUCOTROL) 10 MG tablet, TAKE 1 TABLET BY MOUTH TWICE A DAY BEFORE MEALS, Disp: 180 tablet, Rfl: 3   glucose blood (ONETOUCH VERIO) test strip, Use as instructed, Disp: 300 strip, Rfl: 0   losartan  (COZAAR ) 100 MG tablet, TAKE 1 TABLET BY MOUTH EVERY DAY, Disp: 90 tablet, Rfl: 0   metFORMIN  (GLUCOPHAGE ) 500 MG tablet, Take 2 tablets (1,000 mg total) by mouth 2 (two) times daily with a meal., Disp: 360 tablet, Rfl: 1   metoprolol  succinate (TOPROL -XL) 100 MG 24 hr tablet, TAKE 1 AND 1/2 TABLETS (150MG ) BY MOUTH DAILY IMMEDIATELY FOLLOWING A MEAL, Disp: 135 tablet, Rfl: 0   potassium chloride  (KLOR-CON ) 10 MEQ tablet, Take 2 tablets (20 mEq total) by mouth daily., Disp: 180 tablet, Rfl: 3   rosuvastatin  (CRESTOR ) 20 MG tablet, Take 1 tablet (20 mg total) by mouth daily., Disp: 90 tablet, Rfl: 3  Allergies:  Allergies  Allergen Reactions   Shellfish Allergy Nausea And Vomiting   Neosporin [Neomycin-Bacitracin Zn-Polymyx] Rash   Zinc Rash    Past Medical History, Surgical history, Social history, and Family History were reviewed and updated.  Review of Systems: Review of Systems  Constitutional: Negative.   HENT:  Negative.    Eyes: Negative.   Respiratory: Negative.    Cardiovascular: Negative.   Gastrointestinal: Negative.   Endocrine: Negative.   Genitourinary: Negative.    Musculoskeletal: Negative.   Skin: Negative.   Neurological: Negative.   Hematological: Negative.   Psychiatric/Behavioral: Negative.      Physical Exam:  height is 5' 9.5 (1.765 m) and weight is 190 lb (86.2 kg). His oral temperature is 98.1 F (36.7 C). His blood  pressure is 132/75 and his pulse is 75. His respiration is 16.   Wt Readings from Last 3 Encounters:  04/27/24 190 lb (86.2 kg)  02/15/24 190 lb (86.2 kg)  01/17/24 189 lb (85.7 kg)    Physical Exam Vitals reviewed.  HENT:     Head: Normocephalic and atraumatic.  Eyes:     Pupils: Pupils are equal, round, and reactive to light.  Cardiovascular:     Rate and Rhythm: Normal rate and regular rhythm.     Heart sounds: Normal heart sounds.  Pulmonary:     Effort: Pulmonary effort is normal.     Breath sounds: Normal breath sounds.  Abdominal:     General: Bowel sounds are normal.     Palpations: Abdomen is soft.  Musculoskeletal:        General: No tenderness or deformity. Normal range of motion.     Cervical back: Normal range of motion.  Lymphadenopathy:     Cervical: No cervical adenopathy.  Skin:    General: Skin is warm and dry.     Findings: No erythema or rash.  Neurological:     Mental Status: He is alert and oriented to person, place, and time.  Psychiatric:        Behavior: Behavior normal.        Thought Content: Thought content normal.        Judgment: Judgment normal.      Lab Results  Component Value Date   WBC 6.6 04/18/2024   HGB 13.7 04/18/2024   HCT 42.3 04/18/2024   MCV 86.5 04/18/2024   PLT 223 04/18/2024     Chemistry      Component Value Date/Time   NA 139 04/18/2024 1311   NA 141 09/26/2021 0000   K 4.4 04/18/2024 1311   CL 101 04/18/2024 1311   CO2 24 04/18/2024 1311   BUN 14 04/18/2024 1311   BUN 14 09/26/2021 0000   CREATININE 1.63 (H) 04/18/2024 1311   GLU 119 09/26/2021 0000      Component Value Date/Time   CALCIUM  10.1 04/18/2024 1311   ALKPHOS 80 04/18/2024 1311   AST 25 04/18/2024 1311   ALT 34 04/18/2024 1311   BILITOT 0.5 04/18/2024 1311      Impression and Plan: 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  He and his wife are very delightful to talk to.  From my point of view, everything is looking  quite good.  His iron studies are doing great.  His MCV is stable.  His hemoglobin is holding steady.  He is doing well with the oral iron.  As such, I do not see that we had to make any changes with this.  We will plan to get him back before the Holiday season.  I want to make sure that everything is still okay with his blood so we can enjoy the holidays with his family.    Maude JONELLE Crease, MD 7/31/202512:52 PM

## 2024-06-06 ENCOUNTER — Ambulatory Visit: Admitting: Podiatry

## 2024-06-17 ENCOUNTER — Other Ambulatory Visit: Payer: Self-pay | Admitting: Family Medicine

## 2024-06-21 NOTE — Progress Notes (Signed)
 KYAL ARTS                                          MRN: [id number]   06/21/2024   The [facility] Quality Team Specialist reviewed this patient medical record for the purposes of chart review for care gap closure. The following were reviewed: chart review for care gap closure-kidney health evaluation for diabetes:eGFR  and uACR.    [facility]I Quality Team

## 2024-06-26 ENCOUNTER — Other Ambulatory Visit: Payer: Self-pay | Admitting: Family Medicine

## 2024-06-26 NOTE — Telephone Encounter (Signed)
 Patient's wife called and advised the metoprolol  is supposed to be taken. Advised the refusal was due to he didn't come in for a 3 mo f/u appointment in August. She asked was he scheduled, advised I don't see a scheduled August appointment. She asks is there a process in place that notify patients when they are due for appointments. Advised at checkout appointments are usually scheduled, but not sure what happed at his last checkout. She says something needs to be reported about this. He agreed to schedule an appointment on Wednesday. She says he is out of the metoprolol . Advised I will send the request back.   Copied from CRM #[id number]. Topic: Clinical - Medication Question >> Jun 26, 2024  2:38 PM Timindy P wrote: Reason for CRM: Sharlet, pts wife is calling to see if the patient needs to be taking Metoprolol  still or if this is no longer needed? She is asking for a call back at Telephone Information: Mobile          [phone]

## 2024-06-27 ENCOUNTER — Other Ambulatory Visit: Payer: Self-pay

## 2024-06-27 MED ORDER — METOPROLOL SUCCINATE ER 100 MG PO TB24: 100.0000 mg | ORAL_TABLET | Freq: Every day | ORAL | 0 refills | Status: DC

## 2024-06-27 NOTE — Telephone Encounter (Signed)
 Rx was refilled and sent to the pharmacy.

## 2024-06-28 ENCOUNTER — Ambulatory Visit: Admitting: Family Medicine

## 2024-06-28 ENCOUNTER — Encounter: Payer: Self-pay | Admitting: Family Medicine

## 2024-06-28 VITALS — BP 135/82 | HR 86 | Temp 96.1°F | Resp 18 | Ht 69.5 in | Wt 187.8 lb

## 2024-06-28 DIAGNOSIS — I152 Hypertension secondary to endocrine disorders: Secondary | ICD-10-CM | POA: Diagnosis not present

## 2024-06-28 DIAGNOSIS — E538 Deficiency of other specified B group vitamins: Secondary | ICD-10-CM | POA: Diagnosis not present

## 2024-06-28 DIAGNOSIS — N1832 Chronic kidney disease, stage 3b: Secondary | ICD-10-CM | POA: Diagnosis not present

## 2024-06-28 DIAGNOSIS — E1142 Type 2 diabetes mellitus with diabetic polyneuropathy: Secondary | ICD-10-CM

## 2024-06-28 DIAGNOSIS — E1159 Type 2 diabetes mellitus with other circulatory complications: Secondary | ICD-10-CM | POA: Diagnosis not present

## 2024-06-28 DIAGNOSIS — I509 Heart failure, unspecified: Secondary | ICD-10-CM | POA: Diagnosis not present

## 2024-06-28 DIAGNOSIS — E1122 Type 2 diabetes mellitus with diabetic chronic kidney disease: Secondary | ICD-10-CM | POA: Diagnosis not present

## 2024-06-28 DIAGNOSIS — D6489 Other specified anemias: Secondary | ICD-10-CM

## 2024-06-28 DIAGNOSIS — Z7984 Long term (current) use of oral hypoglycemic drugs: Secondary | ICD-10-CM | POA: Diagnosis not present

## 2024-06-28 MED ORDER — METOPROLOL SUCCINATE ER 100 MG PO TB24: 100.0000 mg | ORAL_TABLET | Freq: Every day | ORAL | 3 refills | Status: DC

## 2024-06-28 MED ORDER — METFORMIN HCL 500 MG PO TABS: 1000.0000 mg | ORAL_TABLET | Freq: Two times a day (BID) | ORAL | 3 refills | Status: AC

## 2024-06-28 MED ORDER — LOSARTAN POTASSIUM 100 MG PO TABS: 100.0000 mg | ORAL_TABLET | Freq: Every day | ORAL | 3 refills | Status: AC

## 2024-06-28 MED ORDER — METFORMIN HCL 500 MG PO TABS
1000.0000 mg | ORAL_TABLET | Freq: Two times a day (BID) | ORAL | 3 refills | Status: DC
Start: 2024-06-28 — End: 2024-06-28

## 2024-06-28 NOTE — Patient Instructions (Addendum)
  VISIT SUMMARY: You came in today for a follow-up on your chronic conditions, including hypertension, coronary artery disease, chronic kidney disease, type 2 diabetes, and heart failure. Overall, your conditions are stable, and we have made some adjustments to your medications and ordered several tests to monitor your health.  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 We need to check your blood sugar control and kidney function. -Order hemoglobin A1c test to check long-term blood sugar control. -Order microalbumin/creatinine ratio to monitor kidney function. -Order complete metabolic panel to check overall health. -Order urinalysis to check for any kidney issues. -Order parathyroid hormone, phosphorus, and vitamin D levels to monitor bone health.  ANEMIA DUE TO CHRONIC DISEASE AND IRON DEFICIENCY: Your anemia is related to your chronic kidney disease and iron deficiency. -Order CBC, iron, TIBC, ferritin, and reticulocyte count to evaluate your anemia.  VITAMIN B12 DEFICIENCY: Your B12 levels have been fluctuating, which may be related to your diabetes medication. -Order vitamin B12 and folate levels to check your B12 status.  HEART FAILURE: Your heart failure is being managed with medication. -Order pro-BNP to monitor heart failure. -Order magnesium  level to check electrolyte balance.  HYPERTENSION: Your blood pressure is well-controlled with your current medications. -Continue taking amlodipine  7.5 mg daily. -Continue taking losartan  100 mg daily. -Continue taking furosemide  20 mg daily. -Hold metoprolol  and monitor your blood pressure at home. -Reassess blood pressure in one month.  CORONARY ARTERY DISEASE: Your coronary artery disease is managed with aspirin . -Continue taking aspirin  81 mg daily.  HYPERLIPIDEMIA: Your cholesterol levels are managed with a statin. -Continue taking  rosuvastatin  20 mg daily.  HYPOKALEMIA: Your low potassium levels are managed with supplementation. -Continue taking potassium chloride  20 mEq daily.

## 2024-06-28 NOTE — Progress Notes (Signed)
 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 Metformin  use may contribute to B12 deficiency, affecting anemia management. 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 - Order CBC, iron, TIBC, ferritin, and reticulocyte count  Vitamin B12 deficiency Vitamin B12 deficiency with fluctuating levels. Metformin  use may contribute to low B12 levels. - Order vitamin B12 and folate levels  Heart failure Chronic heart failure. - Order pro-BNP - Order magnesium  level  Hypertension Hypertension well-controlled with a blood pressure of 135/82 mmHg. Metoprolol  has been held for a month with stable readings. Home monitoring is advised. - Continue amlodipine  7.5 mg daily - Continue losartan  100 mg daily - Continue furosemide  20 mg daily - Hold metoprolol  and monitor blood pressure at home - Reassess blood pressure in one month  Coronary artery disease Coronary artery disease managed with antiplatelet therapy. - Continue aspirin  81 mg daily  Hyperlipidemia Hyperlipidemia managed with statin therapy. - Continue rosuvastatin  20 mg daily  Hypokalemia Hypokalemia managed with potassium supplementation. - Continue potassium chloride  20 mEq daily      Medications Discontinued During This Encounter  Medication Reason   ciclopirox  (PENLAC ) 8 % solution    clobetasol  ointment (TEMOVATE ) 0.05 %    metFORMIN  (GLUCOPHAGE ) 500 MG tablet Reorder   losartan  (COZAAR ) 100 MG tablet Reorder   metoprolol  succinate (TOPROL -XL) 100 MG 24 hr tablet Reorder   metFORMIN  (GLUCOPHAGE ) 500 MG tablet    metoprolol  succinate  (TOPROL -XL) 100 MG 24 hr tablet     Return in about 1 month (around 07/29/2024) for BP.        Subjective:   Encounter date: 06/28/2024  Michael Hudson is a 83 y.o. 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   He  has a past medical history of Abnormal EKG (03/25/2016), Benign tumor of skin, CVA (cerebral infarction) (03/24/2016), Diabetes mellitus without complication (HCC), Enlarged prostate, Hypercholesterolemia, Hyperlipidemia, Hypertension, Hypertrophy of prostate, Hypokalemia (03/25/2016), Left-sided weakness (03/24/2016), LVH (left ventricular hypertrophy), Primary osteoarthritis involving multiple joints, TIA (transient ischemic attack) (03/24/2016), and Type 2 diabetes mellitus without complication, without long-term current use of insulin  (HCC).SABRA   He presents with chief complaint of Medical Management of Chronic Issues (Pt presents today for a DM Check. Pt doesn't have any specific questions or concerns /Pt will like his fluc vaccine and has not has his eye exam ) .   Discussed the use of AI scribe software for clinical note transcription with the patient, who gave verbal consent to proceed.  History of Present Illness 02/15/2024  Michael Hudson is an 83 year old male with diabetes who  presents for a  follow-up visit.  His last A1c was stable at 7.5 as of January 14, 2024. He reports no new symptoms and feels the same as before. He is currently taking amlodipine  7.5 mg, empagliflozin  10 mg, metformin  1000 mg twice daily, and glipizide as needed, typically 2-3 times a week, usually in the evening. His blood sugar levels have been averaging 158 over the past seven days, with pre-meal levels around 155 and post-meal levels around 188. He uses glipizide when his blood sugar exceeds 200, often cutting the pill in half to manage his levels.  He has a history of B12 deficiency and anemia, which have improved. He was previously taken off B12 supplements due to high levels. No change in symptoms related to anemia or B12 deficiency is noted.  He is also on losartan  for kidney protection, with kidney function being stable. He has not seen a kidney specialist before and has no current plans to do so.  He manages seasonal allergies with cetirizine as needed and reports no significant allergy symptoms currently.  No chest pain, shortness of breath, or dizziness.  06/28/2024  Michael Hudson is an 83 year old male with hypertension, coronary artery disease, and chronic kidney disease who presents for chronic disease management.  Hypertension - Takes amlodipine  7.5 mg daily and losartan  100 mg daily - Previously took metoprolol , but has not taken it for about a month - Blood pressure remains stable without metoprolol  - No chest pain, shortness of breath, or palpitations  Coronary artery disease - Takes aspirin  81 mg daily and rosuvastatin  20 mg daily - Previously prescribed metoprolol  succinate 100 mg daily, not taken for about a month - No chest pain or shortness of breath  Chronic kidney disease and anemia - CKD stage 3B - Due for renal function tests and microalbumin to creatinine ratio - Anemia of chronic disease associated with kidney disease and iron deficiency - Takes ferrous  sulfate 325 mg daily - B12 levels have fluctuated in the past, not currently on B12 supplementation - No decreased urination or leg swelling  Type 2 diabetes mellitus - Takes dapagliflozin  10 mg daily, glipizide 10 mg daily, and metformin  1000 mg twice a day - Metformin  intake is sometimes adjusted based on home blood sugar readings - Due for hemoglobin A1c testing - No excessive thirst  Heart failure and electrolyte management - History of heart failure - Takes furosemide  20 mg daily - Takes potassium chloride  20 mEq daily for hypokalemia - No leg swelling or shortness of breath  General well-being - States he is 'living his life and everything's okay'       Past Surgical History:  Procedure Laterality Date   UPPER GI ENDOSCOPY  08/17/2022   See scanned document    Outpatient Medications Prior to Visit  Medication Sig Dispense Refill   amLODipine  (NORVASC ) 2.5 MG tablet Take 1 tablet (2.5 mg total) by mouth daily. To be taken with 5 mg tablet for total of 7.5 mg daily 90 tablet 2   amLODipine  (NORVASC ) 5 MG tablet Take 1 tablet (5 mg total) by mouth daily. 90 tablet 2   Ascorbic Acid (VITAMIN C PO) Take by mouth. daily     aspirin  EC 81 MG tablet Take 81 mg by mouth daily.     dapagliflozin  propanediol (FARXIGA ) 10 MG TABS tablet Take 1 tablet (10 mg total) by mouth daily before breakfast. 90 tablet 3   Ferrous Sulfate (IRON) 325 (65 Fe) MG TABS Take 1 tablet by mouth  daily.     furosemide  (LASIX ) 20 MG tablet Take 1 tablet (20 mg total) by mouth daily. 90 tablet 3   glipiZIDE (GLUCOTROL) 10 MG tablet TAKE 1 TABLET BY MOUTH TWICE A DAY BEFORE MEALS 180 tablet 3   glucose blood (ONETOUCH VERIO) test strip Use as instructed 300 strip 0   potassium chloride  (KLOR-CON ) 10 MEQ tablet Take 2 tablets (20 mEq total) by mouth daily. 180 tablet 3   rosuvastatin  (CRESTOR ) 20 MG tablet Take 1 tablet (20 mg total) by mouth daily. 90 tablet 3   losartan  (COZAAR ) 100 MG tablet TAKE 1  TABLET BY MOUTH EVERY DAY 90 tablet 0   metFORMIN  (GLUCOPHAGE ) 500 MG tablet Take 2 tablets (1,000 mg total) by mouth 2 (two) times daily with a meal. 360 tablet 1   metoprolol  succinate (TOPROL -XL) 100 MG 24 hr tablet Take 1 tablet (100 mg total) by mouth daily. Take with or immediately following a meal. 135 tablet 0   Cyanocobalamin  2500 MCG CHEW Chew 2,500 mcg by mouth daily. (Patient not taking: Reported on 06/28/2024)     ciclopirox  (PENLAC ) 8 % solution Apply topically at bedtime. Apply over nail and surrounding skin. Apply daily over previous coat. After seven (7) days, may remove with alcohol and continue cycle. (Patient not taking: Reported on 06/28/2024) 6.6 mL 0   clobetasol  ointment (TEMOVATE ) 0.05 % Apply 1 Application topically 2 (two) times daily. (Patient not taking: Reported on 06/28/2024) 30 g 0   No facility-administered medications prior to visit.    Family History  Problem Relation Age of Onset   Other Mother        natural cases   Other Father        hx unkown   Healthy Sister    Healthy Brother    Healthy Brother     Social History   Socioeconomic History   Marital status: Married    Spouse name: Not on file   Number of children: Not on file   Years of education: Not on file   Highest education level: Not on file  Occupational History   Not on file  Tobacco Use   Smoking status: Never    Passive exposure: Never   Smokeless tobacco: Never  Vaping Use   Vaping status: Never Used  Substance and Sexual Activity   Alcohol use: No   Drug use: No   Sexual activity: Not on file    Comment: married  Other Topics Concern   Not on file  Social History Narrative   Not on file   Social Drivers of Health   Financial Resource Strain: Low Risk  (09/06/2023)   Overall Financial Resource Strain (CARDIA)    Difficulty of Paying Living Expenses: Not hard at all  Food Insecurity: No Food Insecurity (09/06/2023)   Hunger Vital Sign    Worried About Running Out of  Food in the Last Year: Never true    Ran Out of Food in the Last Year: Never true  Transportation Needs: No Transportation Needs (09/06/2023)   PRAPARE - Administrator, Civil Service (Medical): No    Lack of Transportation (Non-Medical): No  Physical Activity: Insufficiently Active (09/06/2023)   Exercise Vital Sign    Days of Exercise per Week: 3 days    Minutes of Exercise per Session: 30 min  Stress: No Stress Concern Present (09/06/2023)   [employer] of Occupational Health - Occupational Stress Questionnaire    Feeling of Stress : Not  at all  Social Connections: Moderately Integrated (09/06/2023)   Social Connection and Isolation Panel    Frequency of Communication with Friends and Family: More than three times a week    Frequency of Social Gatherings with Friends and Family: Not on file    Attends Religious Services: More than 4 times per year    Active Member of [employer] or Organizations: No    Attends Banker Meetings: Never    Marital Status: Married  Catering manager Violence: Not At Risk (09/06/2023)   Humiliation, Afraid, Rape, and Kick questionnaire    Fear of Current or Ex-Partner: No    Emotionally Abused: No    Physically Abused: No    Sexually Abused: No                                                                                                  Objective:  Physical Exam: BP 135/82 (BP Location: Left Arm, Patient Position: Sitting, Cuff Size: Normal)   Pulse 86   Temp (!) 96.1 F (35.6 C) (Temporal)   Resp 18   Ht 5' 9.5 (1.765 m)   Wt 187 lb 12.8 oz (85.2 kg)   SpO2 99%   BMI 27.34 kg/m    Physical Exam  GENERAL: Alert, cooperative, well developed, no acute distress. HEENT: Normocephalic, normal oropharynx, moist mucous membranes. CHEST: Clear to auscultation bilaterally, no wheezes, rhonchi, or crackles. CARDIOVASCULAR: Normal heart rate and rhythm, S1 and S2 normal without murmurs. ABDOMEN: Soft, non-tender,  non-distended, without organomegaly, normal bowel sounds. EXTREMITIES: No cyanosis or edema. NEUROLOGICAL: Cranial nerves grossly intact, moves all extremities without gross motor or sensory deficit.   VITALS: P- 86, BP- 135/82 GENERAL: Alert, cooperative, well developed, no acute distress. HEENT: Normocephalic, normal oropharynx, moist mucous membranes. CHEST: Clear to auscultation bilaterally, no wheezes, rhonchi, or crackles. CARDIOVASCULAR: Normal heart rate and rhythm, S1 and S2 normal without murmurs. ABDOMEN: Soft, non-tender, non-distended, without organomegaly, normal bowel sounds. EXTREMITIES: No cyanosis or edema. NEUROLOGICAL: Cranial nerves grossly intact, moves all extremities without gross motor or sensory deficit.     No results found.  Recent Results (from the past 2160 hours)  CBC with Differential (Cancer Center Only)     Status: None   Collection Time: 04/18/24  1:11 PM  Result Value Ref Range   WBC Count 6.6 4.0 - 10.5 K/uL   RBC 4.89 4.22 - 5.81 MIL/uL   Hemoglobin 13.7 13.0 - 17.0 g/dL   HCT 57.6 60.9 - 47.9 %   MCV 86.5 80.0 - 100.0 fL   MCH 28.0 26.0 - 34.0 pg   MCHC 32.4 30.0 - 36.0 g/dL   RDW 85.5 88.4 - 84.4 %   Platelet Count 223 150 - 400 K/uL   nRBC 0.0 0.0 - 0.2 %   Neutrophils Relative % 50 %   Neutro Abs 3.3 1.7 - 7.7 K/uL   Lymphocytes Relative 37 %   Lymphs Abs 2.5 0.7 - 4.0 K/uL   Monocytes Relative 7 %   Monocytes Absolute 0.4 0.1 - 1.0 K/uL   Eosinophils Relative 5 %  Eosinophils Absolute 0.3 0.0 - 0.5 K/uL   Basophils Relative 1 %   Basophils Absolute 0.0 0.0 - 0.1 K/uL   Immature Granulocytes 0 %   Abs Immature Granulocytes 0.02 0.00 - 0.07 K/uL    Comment: Performed at [facility], [address] [place], [place] [address]  CMP (Cancer Center only)     Status: Abnormal   Collection Time: 04/18/24  1:11 PM  Result Value Ref Range   Sodium 139 135 - 145 mmol/L   Potassium 4.4 3.5 - 5.1 mmol/L   Chloride 101 98 -  111 mmol/L   CO2 24 22 - 32 mmol/L   Glucose, Bld 148 (H) 70 - 99 mg/dL    Comment: Glucose reference range applies only to samples taken after fasting for at least 8 hours.   BUN 14 8 - 23 mg/dL   Creatinine 8.36 (H) 9.38 - 1.24 mg/dL   Calcium  10.1 8.9 - 10.3 mg/dL   Total Protein 8.2 (H) 6.5 - 8.1 g/dL   Albumin 4.7 3.5 - 5.0 g/dL   AST 25 15 - 41 U/L   ALT 34 0 - 44 U/L   Alkaline Phosphatase 80 38 - 126 U/L   Total Bilirubin 0.5 0.0 - 1.2 mg/dL   GFR, Estimated 42 (L) >60 mL/min    Comment: (NOTE) Calculated using the CKD-EPI Creatinine Equation (2021)    Anion gap 14 5 - 15    Comment: Performed at [facility], [address] [place], [place] [address]  Ferritin     Status: None   Collection Time: 04/18/24  1:11 PM  Result Value Ref Range   Ferritin 93 24 - 336 ng/mL    Comment: Performed at [facility], [address], [place], [place] [address]  Iron and Iron Binding Capacity ([facility] only)     Status: None   Collection Time: 04/18/24  1:11 PM  Result Value Ref Range   Iron 93 45 - 182 ug/dL   TIBC 607 749 - 549 ug/dL   Saturation Ratios 24 17.9 - 39.5 %   UIBC 299 ug/dL    Comment: Performed at [facility], [address] [place], [place] [address]  Reticulocytes     Status: Abnormal   Collection Time: 04/18/24  1:12 PM  Result Value Ref Range   Retic Ct Pct 1.7 0.4 - 3.1 %   RBC. 4.88 4.22 - 5.81 MIL/uL   Retic Count, Absolute 84.9 19.0 - 186.0 K/uL   Immature Retic Fract 17.9 (H) 2.3 - 15.9 %    Comment: Performed at [facility], [address] [place], [place] [address]        Beverley Adine Hummer, MD, MS

## 2024-06-29 NOTE — Addendum Note (Signed)
 Addended by: ALTO PARODY D on: 06/29/2024 02:50 PM   Modules accepted: Orders

## 2024-06-29 NOTE — Addendum Note (Signed)
 Addended by: ALTO PARODY D on: 06/29/2024 03:02 PM   Modules accepted: Orders

## 2024-06-30 ENCOUNTER — Other Ambulatory Visit

## 2024-06-30 ENCOUNTER — Telehealth: Payer: Self-pay

## 2024-06-30 ENCOUNTER — Other Ambulatory Visit (INDEPENDENT_AMBULATORY_CARE_PROVIDER_SITE_OTHER)

## 2024-06-30 DIAGNOSIS — E538 Deficiency of other specified B group vitamins: Secondary | ICD-10-CM

## 2024-06-30 DIAGNOSIS — D6489 Other specified anemias: Secondary | ICD-10-CM | POA: Diagnosis not present

## 2024-06-30 DIAGNOSIS — E1122 Type 2 diabetes mellitus with diabetic chronic kidney disease: Secondary | ICD-10-CM

## 2024-06-30 DIAGNOSIS — N1832 Chronic kidney disease, stage 3b: Secondary | ICD-10-CM | POA: Diagnosis not present

## 2024-06-30 DIAGNOSIS — I509 Heart failure, unspecified: Secondary | ICD-10-CM | POA: Diagnosis not present

## 2024-06-30 LAB — CBC WITH DIFFERENTIAL/PLATELET
Basophils Absolute: 0 K/uL (ref 0.0–0.1)
Basophils Relative: 0.9 % (ref 0.0–3.0)
Eosinophils Absolute: 0.4 K/uL (ref 0.0–0.7)
Eosinophils Relative: 8.1 % — ABNORMAL HIGH (ref 0.0–5.0)
HCT: 40.2 % (ref 39.0–52.0)
Hemoglobin: 13.2 g/dL (ref 13.0–17.0)
Lymphocytes Relative: 34.5 % (ref 12.0–46.0)
Lymphs Abs: 1.9 K/uL (ref 0.7–4.0)
MCHC: 32.8 g/dL (ref 30.0–36.0)
MCV: 86.7 fl (ref 78.0–100.0)
Monocytes Absolute: 0.3 K/uL (ref 0.1–1.0)
Monocytes Relative: 5.9 % (ref 3.0–12.0)
Neutro Abs: 2.7 K/uL (ref 1.4–7.7)
Neutrophils Relative %: 50.6 % (ref 43.0–77.0)
Platelets: 235 K/uL (ref 150.0–400.0)
RBC: 4.63 Mil/uL (ref 4.22–5.81)
RDW: 15.6 % — ABNORMAL HIGH (ref 11.5–15.5)
WBC: 5.4 K/uL (ref 4.0–10.5)

## 2024-06-30 LAB — COMPREHENSIVE METABOLIC PANEL WITH GFR
ALT: 21 U/L (ref 0–53)
AST: 17 U/L (ref 0–37)
Albumin: 4.3 g/dL (ref 3.5–5.2)
Alkaline Phosphatase: 75 U/L (ref 39–117)
BUN: 13 mg/dL (ref 6–23)
CO2: 27 meq/L (ref 19–32)
Calcium: 9.6 mg/dL (ref 8.4–10.5)
Chloride: 101 meq/L (ref 96–112)
Creatinine, Ser: 1.59 mg/dL — ABNORMAL HIGH (ref 0.40–1.50)
GFR: 40.1 mL/min — ABNORMAL LOW (ref >60.00)
Glucose, Bld: 160 mg/dL — ABNORMAL HIGH (ref 70–99)
Potassium: 4 meq/L (ref 3.5–5.1)
Sodium: 139 meq/L (ref 135–145)
Total Bilirubin: 0.6 mg/dL (ref 0.2–1.2)
Total Protein: 7.3 g/dL (ref 6.0–8.3)

## 2024-06-30 LAB — MICROALBUMIN / CREATININE URINE RATIO
Creatinine,U: 115.5 mg/dL
Microalb Creat Ratio: 91.4 mg/g — ABNORMAL HIGH (ref 0.0–30.0)
Microalb, Ur: 10.6 mg/dL — ABNORMAL HIGH (ref 0.0–1.9)

## 2024-06-30 LAB — LIPID PANEL
Cholesterol: 106 mg/dL (ref 0–200)
HDL: 41 mg/dL (ref >39.00)
LDL Cholesterol: 43 mg/dL (ref 0–99)
NonHDL: 65.36
Total CHOL/HDL Ratio: 3
Triglycerides: 111 mg/dL (ref 0.0–149.0)
VLDL: 22.2 mg/dL (ref 0.0–40.0)

## 2024-06-30 LAB — TSH: TSH: 1.45 u[IU]/mL (ref 0.35–5.50)

## 2024-06-30 LAB — B12 AND FOLATE PANEL
Folate: >22.9 ng/mL (ref >5.9)
Vitamin B-12: 491 pg/mL (ref 211–911)

## 2024-06-30 LAB — MAGNESIUM: Magnesium: 1.8 mg/dL (ref 1.5–2.5)

## 2024-06-30 LAB — PHOSPHORUS: Phosphorus: 3.2 mg/dL (ref 2.3–4.6)

## 2024-06-30 LAB — HEMOGLOBIN A1C: Hgb A1c MFr Bld: 8.3 % — ABNORMAL HIGH (ref 4.6–6.5)

## 2024-06-30 NOTE — Telephone Encounter (Signed)
 Pt has some labs from 06/28/24 that are In process. Angelica, phlebotomist, attempted to add-on what she could for [facility], faxed signed order requisition to [facility] for CMP.

## 2024-07-03 ENCOUNTER — Ambulatory Visit: Payer: Self-pay | Admitting: Family Medicine

## 2024-07-03 LAB — B12 AND FOLATE PANEL
Folate: >24 ng/mL
Vitamin B-12: 733 pg/mL (ref 200–1100)

## 2024-07-03 LAB — MAGNESIUM: Magnesium: 2.2 mg/dL (ref 1.5–2.5)

## 2024-07-03 LAB — COMPREHENSIVE METABOLIC PANEL WITH GFR
AG Ratio: 1.3 (calc) (ref 1.0–2.5)
ALT: 23 U/L (ref 9–46)
AST: 22 U/L (ref 10–35)
Albumin: 4.7 g/dL (ref 3.6–5.1)
Alkaline phosphatase (APISO): 86 U/L (ref 35–144)
BUN/Creatinine Ratio: 8 (calc) (ref 6–22)
BUN: 14 mg/dL (ref 7–25)
CO2: 20 mmol/L (ref 20–32)
Calcium: 10.6 mg/dL — ABNORMAL HIGH (ref 8.6–10.3)
Chloride: 103 mmol/L (ref 98–110)
Creat: 1.78 mg/dL — ABNORMAL HIGH (ref 0.70–1.22)
Globulin: 3.5 g/dL (ref 1.9–3.7)
Glucose, Bld: 133 mg/dL — ABNORMAL HIGH (ref 65–99)
Potassium: 4.6 mmol/L (ref 3.5–5.3)
Sodium: 143 mmol/L (ref 135–146)
Total Bilirubin: 0.4 mg/dL (ref 0.2–1.2)
Total Protein: 8.2 g/dL — ABNORMAL HIGH (ref 6.1–8.1)
eGFR: 38 mL/min/1.73m2 — ABNORMAL LOW (ref >=60)

## 2024-07-03 LAB — LIPID PANEL
Cholesterol: 119 mg/dL (ref <200)
HDL: 45 mg/dL (ref >=40)
LDL Cholesterol (Calc): 46 mg/dL
Non-HDL Cholesterol (Calc): 74 mg/dL (ref <130)
Total CHOL/HDL Ratio: 2.6 (calc) (ref <5.0)
Triglycerides: 210 mg/dL — ABNORMAL HIGH (ref <150)

## 2024-07-03 LAB — VITAMIN D 1,25 DIHYDROXY
Vitamin D 1, 25 (OH)2 Total: 51 pg/mL (ref 18–72)
Vitamin D2 1, 25 (OH)2: <8 pg/mL
Vitamin D3 1, 25 (OH)2: 51 pg/mL

## 2024-07-03 LAB — IRON,TIBC AND FERRITIN PANEL
%SAT: 22 % (ref 20–48)
Ferritin: 62 ng/mL (ref 24–380)
Iron: 75 ug/dL (ref 50–180)
TIBC: 334 ug/dL (ref 250–425)

## 2024-07-03 LAB — TEST AUTHORIZATION

## 2024-07-03 LAB — URINALYSIS W MICROSCOPIC + REFLEX CULTURE

## 2024-07-03 LAB — TSH: TSH: 1.08 m[IU]/L (ref 0.40–4.50)

## 2024-07-03 LAB — PHOSPHORUS: Phosphorus: 3.5 mg/dL (ref 2.1–4.3)

## 2024-07-03 LAB — PARATHYROID HORMONE, INTACT (NO CA)

## 2024-07-03 LAB — RETICULOCYTES

## 2024-07-04 NOTE — Telephone Encounter (Signed)
 Have patient - Continue amlodipine  (5 mg +2.5 mg) 7.5 mg daily - Continue losartan  100 mg daily - Continue furosemide  20 mg daily - Hold metoprolol  and monitor blood pressure at home - Reassess blood pressure at next visit

## 2024-07-05 LAB — PRO B NATRIURETIC PEPTIDE

## 2024-07-08 LAB — PRO B NATRIURETIC PEPTIDE: NT-Pro BNP: 117 pg/mL (ref 0–486)

## 2024-07-16 ENCOUNTER — Other Ambulatory Visit: Payer: Self-pay | Admitting: Family Medicine

## 2024-07-16 DIAGNOSIS — E119 Type 2 diabetes mellitus without complications: Secondary | ICD-10-CM

## 2024-07-18 ENCOUNTER — Telehealth: Payer: Self-pay

## 2024-07-18 NOTE — Telephone Encounter (Signed)
 Labs ordered 06/28/24 have resulted.

## 2024-07-18 NOTE — Telephone Encounter (Signed)
 Patient was identified as falling into the [place] Measure - Diabetes.   Patient was: Appointment scheduled with primary care provider in the next 30 days.  OV with PCP 08/02/24.

## 2024-08-02 ENCOUNTER — Encounter: Payer: Self-pay | Admitting: Family Medicine

## 2024-08-02 ENCOUNTER — Ambulatory Visit: Admitting: Family Medicine

## 2024-08-02 VITALS — BP 108/68 | HR 82 | Temp 97.0°F | Resp 18 | Wt 189.0 lb

## 2024-08-02 DIAGNOSIS — Z7984 Long term (current) use of oral hypoglycemic drugs: Secondary | ICD-10-CM | POA: Diagnosis not present

## 2024-08-02 DIAGNOSIS — N1832 Chronic kidney disease, stage 3b: Secondary | ICD-10-CM | POA: Diagnosis not present

## 2024-08-02 DIAGNOSIS — I152 Hypertension secondary to endocrine disorders: Secondary | ICD-10-CM | POA: Diagnosis not present

## 2024-08-02 DIAGNOSIS — E1159 Type 2 diabetes mellitus with other circulatory complications: Secondary | ICD-10-CM

## 2024-08-02 DIAGNOSIS — R809 Proteinuria, unspecified: Secondary | ICD-10-CM

## 2024-08-02 DIAGNOSIS — E1129 Type 2 diabetes mellitus with other diabetic kidney complication: Secondary | ICD-10-CM

## 2024-08-02 DIAGNOSIS — I2510 Atherosclerotic heart disease of native coronary artery without angina pectoris: Secondary | ICD-10-CM

## 2024-08-02 DIAGNOSIS — D6489 Other specified anemias: Secondary | ICD-10-CM | POA: Diagnosis not present

## 2024-08-02 DIAGNOSIS — E1122 Type 2 diabetes mellitus with diabetic chronic kidney disease: Secondary | ICD-10-CM | POA: Diagnosis not present

## 2024-08-02 NOTE — Patient Instructions (Signed)
 It was very nice to see you today!  VISIT SUMMARY: Today, we reviewed your blood pressure and diabetes management. Your blood pressure is well-controlled, but your blood sugar levels need closer monitoring. We also discussed your proteinuria and coronary artery disease.  YOUR PLAN: HYPERTENSION: Your blood pressure is well-controlled with your current medications. -Continue taking amlodipine  7.5 mg daily. -Continue taking losartan  100 mg daily. -Continue taking furosemide  20 mg daily as needed.  TYPE 2 DIABETES MELLITUS: Your recent A1c level is 8.3%, which indicates that your blood sugar levels are not as well-controlled as we would like. -Continue taking metformin  1000 mg twice daily. -Continue taking glipizide 10 mg twice daily. -Continue taking dapagliflozin  10 mg daily. -Monitor your blood glucose levels closely, especially during the holiday season.  PROTEINURIA: You have microscopic proteinuria, likely related to your diabetes and hypertension. -Continue taking losartan  for kidney protection. -Continue taking dapagliflozin  for kidney protection.  CORONARY ARTERY DISEASE: We discussed the potential addition of an SGLT2 inhibitor due to your coronary artery disease and proteinuria, but you prefer to maintain your current regimen. -Continue with your current management and monitor your cardiovascular status.  Return in about 3 months (around 11/02/2024) for DM, BP.   Take care, Arvella Hummer, MD, MS   PLEASE NOTE:  If you had any lab tests, please let us  know if you have not heard back within a few days. You may see your results on mychart before we have a chance to review them but we will give you a call once they are reviewed by us .   If we ordered any referrals today, please let us  know if you have not heard from their office within the next week.   If you had any urgent prescriptions sent in today, please check with the pharmacy within an hour of our visit to make sure the  prescription was transmitted appropriately.   Please try these tips to maintain a healthy lifestyle:  Eat at least 3 REAL meals and 1-2 snacks per day.  Aim for no more than 5 hours between eating.  If you eat breakfast, please do so within one hour of getting up.   Each meal should contain half fruits/vegetables, one quarter protein, and one quarter carbs (no bigger than a computer mouse)  Cut down on sweet beverages. This includes juice, soda, and sweet tea.   Drink at least 1 glass of water with each meal and aim for at least 8 glasses per day  Exercise at least 150 minutes every week.

## 2024-08-02 NOTE — Progress Notes (Signed)
 Assessment & Plan   Assessment/Plan:   Assessment and Plan Assessment & Plan Hypertension Blood pressure is well-controlled with current medications. Home readings average 117/65 mmHg, and today's reading is 108/68 mmHg. - Continue amlodipine  7.5 mg daily - Continue losartan  100 mg daily - Continue furosemide  20 mg daily as needed  Type 2 diabetes mellitus A1c is elevated at 8.3%, indicating suboptimal glycemic control. Current medications include metformin , glipizide, and dapagliflozin . Discussed potential addition of SGLT2 inhibitor due to coronary artery disease and proteinuria, but he prefers to maintain current regimen. Emphasized monitoring during holiday season to prevent further elevation. - Continue metformin  1000 mg BID - Continue glipizide 10 mg BID - Continue dapagliflozin  10 mg daily - Monitor blood glucose levels closely, especially during holiday season  Proteinuria from diabetes Microscopic proteinuria likely related to diabetes and hypertension. Current medications provide renal protection. - Continue losartan  for renal protection - Continue dapagliflozin  for renal protection  Coronary artery disease  Consideration for GLP1 inhibitor due to coronary artery disease and proteinuria, but he prefers to maintain current regimen. - Continue current management and monitor cardiovascular status        There are no discontinued medications.  Return in about 3 months (around 11/02/2024) for DM, BP.        Subjective:   Encounter date: 08/02/2024  Michael Hudson is a 83 y.o. male who has Left-sided weakness; Cerebral infarction 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   He  has a past medical history of Abnormal EKG (03/25/2016), Benign tumor of skin, CVA (cerebral infarction) (03/24/2016), Diabetes mellitus without complication (HCC), Enlarged prostate, Hypercholesterolemia, Hyperlipidemia, Hypertension, Hypertrophy of prostate, Hypokalemia (03/25/2016), Left-sided weakness (03/24/2016), LVH (left ventricular hypertrophy), Primary osteoarthritis involving multiple joints, TIA (transient ischemic attack) (03/24/2016), and Type 2 diabetes mellitus without complication, without long-term current use of insulin  (HCC).SABRA   He presents with chief complaint of Hypertension (1 month follow up. Pt is not fasting today. Pt blood pressure reading at home range from 117/65//HM due-  shingles vaccine ( Pt declined) ) . Discussed the use of AI scribe software for clinical note transcription with the patient, who gave verbal consent to proceed.  History of Present Illness Michael Hudson is an 83 year old male with hypertension and type 2 diabetes mellitus who presents for follow-up on blood pressure management.  Hypertension - Blood pressure readings at home average 117/65 mmHg - Current antihypertensive regimen includes amlodipine  7.5 mg daily, losartan  100 mg daily, and furosemide  20 mg daily as needed - Stable blood pressure control without metoprolol   Type 2 diabetes mellitus - Recent hemoglobin A1c is 8.3% - Current diabetes medications include metformin  1000 mg twice daily, glipizide 10 mg twice daily, and dapagliflozin  10 mg  daily - Gradual increase in A1c over the past year despite current regimen  Coronary artery disease - History of coronary artery disease  Proteinuria - Microscopic proteinuria  is present and being monitored - Consumes glycerin shakes when not eating, which may contribute to protein intake  Social  - Enjoys Halloween activities and spending time with family  ROS  Past Surgical History:  Procedure Laterality Date   UPPER GI ENDOSCOPY  08/17/2022   See scanned document    Current Outpatient Medications on File Prior to Visit  Medication Sig Dispense Refill   amLODipine  (NORVASC ) 2.5 MG tablet Take 1 tablet (2.5 mg total) by mouth daily. To be taken with 5 mg tablet for total of 7.5 mg daily 90 tablet 2   amLODipine  (NORVASC ) 5 MG tablet Take 1 tablet (5 mg total) by mouth daily. 90 tablet 2   Ascorbic Acid (VITAMIN C PO) Take by mouth. daily     aspirin  EC 81 MG tablet Take 81 mg by mouth daily.     Cyanocobalamin  2500 MCG CHEW Chew 2,500 mcg by mouth daily. (Patient not taking: Reported on 06/28/2024)     dapagliflozin  propanediol (FARXIGA ) 10 MG TABS tablet Take 1 tablet (10 mg total) by mouth daily before breakfast. 90 tablet 3   Ferrous Sulfate (IRON) 325 (65 Fe) MG TABS Take 1 tablet by mouth daily.     furosemide  (LASIX ) 20 MG tablet Take 1 tablet (20 mg total) by mouth daily. 90 tablet 3   glipiZIDE (GLUCOTROL) 10 MG tablet TAKE 1 TABLET BY MOUTH TWICE A DAY BEFORE MEALS 180 tablet 3   glucose blood (ONETOUCH VERIO) test strip USE 1 STRIP THREE TIMES DAILY AS DIRECTED 300 strip 0   losartan  (COZAAR ) 100 MG tablet Take 1 tablet (100 mg total) by mouth daily. 90 tablet 3   metFORMIN  (GLUCOPHAGE ) 500 MG tablet Take 2 tablets (1,000 mg total) by mouth 2 (two) times daily with a meal. 360 tablet 3   potassium chloride  (KLOR-CON ) 10 MEQ tablet Take 2 tablets (20 mEq total) by mouth daily. 180 tablet 3   rosuvastatin  (CRESTOR ) 20 MG tablet Take 1 tablet (20 mg total) by mouth daily.  90 tablet 3   No current facility-administered medications on file prior to visit.    Family History  Problem Relation Age of Onset   Other Mother        natural cases   Other Father        hx unkown   Healthy Sister    Healthy Brother    Healthy Brother     Social History   Socioeconomic History   Marital status: Married    Spouse name: Not on file   Number of children: Not on file   Years of education: Not on file   Highest education level: Not on file  Occupational History   Not on file  Tobacco Use   Smoking status: Never    Passive exposure: Never   Smokeless tobacco: Never  Vaping Use   Vaping status: Never Used  Substance and Sexual Activity   Alcohol use: No   Drug use: No   Sexual activity: Not on file    Comment: married  Other Topics Concern   Not on file  Social History Narrative   Not on file   Social Drivers of Health   Financial Resource Strain: Low Risk  (09/06/2023)   Overall Financial Resource Strain (CARDIA)    Difficulty of Paying Living Expenses: Not hard at all  Food Insecurity: No Food Insecurity (09/06/2023)   Hunger Vital Sign    Worried About Running Out of Food in the Last Year: Never  true    Ran Out of Food in the Last Year: Never true  Transportation Needs: No Transportation Needs (09/06/2023)   PRAPARE - Administrator, Civil Service (Medical): No    Lack of Transportation (Non-Medical): No  Physical Activity: Insufficiently Active (09/06/2023)   Exercise Vital Sign    Days of Exercise per Week: 3 days    Minutes of Exercise per Session: 30 min  Stress: No Stress Concern Present (09/06/2023)   [employer] of Occupational Health - Occupational Stress Questionnaire    Feeling of Stress : Not at all  Social Connections: Moderately Integrated (09/06/2023)   Social Connection and Isolation Panel    Frequency of Communication with Friends and Family: More than three times a week    Frequency of Social Gatherings with  Friends and Family: Not on file    Attends Religious Services: More than 4 times per year    Active Member of [employer] or Organizations: No    Attends Banker Meetings: Never    Marital Status: Married  Catering Manager Violence: Not At Risk (09/06/2023)   Humiliation, Afraid, Rape, and Kick questionnaire    Fear of Current or Ex-Partner: No    Emotionally Abused: No    Physically Abused: No    Sexually Abused: No                                                                                                  Objective:  Physical Exam: BP 108/68 (BP Location: Right Arm, Patient Position: Sitting, Cuff Size: Large) Comment: in clinic machine reading  Pulse 82   Temp (!) 97 F (36.1 C) (Temporal)   Resp 18   Wt 189 lb (85.7 kg)   SpO2 98%   BMI 27.51 kg/m   Physical Exam VITALS: BP- 108/68 GENERAL: Alert, cooperative, well developed, no acute distress. HEENT: Normocephalic, normal oropharynx, moist mucous membranes. CHEST: Clear to auscultation bilaterally, no wheezes, rhonchi, or crackles. CARDIOVASCULAR: Regular rate and rhythm, S1 and S2 normal without murmurs. ABDOMEN: Soft, non-tender, non-distended, without organomegaly, normal bowel sounds. EXTREMITIES: No cyanosis or edema. NEUROLOGICAL: Cranial nerves grossly intact, moves all extremities without gross motor or sensory deficit.   Physical Exam  No results found.  Recent Results (from the past 2160 hours)  Vitamin D 1,25 dihydroxy     Status: None   Collection Time: 06/28/24  3:41 PM  Result Value Ref Range   Vitamin D 1, 25 (OH)2 Total 51 18 - 72 pg/mL   Vitamin D3 1, 25 (OH)2 51 pg/mL   Vitamin D2 1, 25 (OH)2 <8 pg/mL    Comment: (Note) Vitamin D3, 1,25(OH)2 indicates both endogenous  production and supplementation. Vitamin D2, 1,25(OH)2 is  an indicator of exogenous sources, such as diet or  supplementation. Interpretation and therapy are based on  measurement of Vitamin D, 1,25 (OH)2,  Total. . This test was developed, and its analytical performance  characteristics have been determined by [employer]. It has not been cleared or approved by the  FDA. This assay has been validated pursuant to the  CLIA  regulations and is used for clinical purposes. . For additional information, please refer to http://education.[facility].com/faq/FAQ199 (This link is being provided for  informational/educational purposes only.) . [facility] [address] [address] [place] [address] [phone] Johanna Agent L. Gino, MD, PhD   Urinalysis w microscopic + reflex cultur     Status: None   Collection Time: 06/28/24  3:41 PM   Specimen: Blood  Result Value Ref Range   Color, Urine CANCELED     Comment: TEST NOT PERFORMED. . No suitable specimen received for testing. Please resubmit  order with yellow top urinalysis transport tube.  Result canceled by the ancillary.   Iron, TIBC and Ferritin Panel     Status: None   Collection Time: 06/28/24  3:41 PM  Result Value Ref Range   Iron 75 50 - 180 mcg/dL   TIBC 665 749 - 574 mcg/dL (calc)   %SAT 22 20 - 48 % (calc)   Ferritin 62 24 - 380 ng/mL  Retic     Status: None   Collection Time: 06/28/24  3:41 PM  Result Value Ref Range   Retic Ct Pct CANCELED     Comment: TEST NOT PERFORMED . No lavender-top tube received.  Result canceled by the ancillary.   Parathyroid hormone, intact (no Ca)     Status: None   Collection Time: 06/28/24  3:41 PM  Result Value Ref Range   PTH CANCELED     Comment: TEST NOT PERFORMED . No suitable specimen received. Please review the test requirements at  [email or web address]  Result canceled by the ancillary.   Lipid panel     Status: Abnormal   Collection Time: 06/28/24  3:41 PM  Result Value Ref Range   Cholesterol 119 <200 mg/dL   HDL 45 > OR = 40 mg/dL   Triglycerides 789 (H) <150 mg/dL    Comment: . If a non-fasting specimen was  collected, consider repeat triglyceride testing on a fasting specimen if clinically indicated.  Veatrice et al. J. of Clin. Lipidol. 2015;9:129-169. SABRA    LDL Cholesterol (Calc) 46 mg/dL (calc)    Comment: Reference range: <100 . Desirable range <100 mg/dL for primary prevention;   <70 mg/dL for patients with CHD or diabetic patients  with > or = 2 CHD risk factors. SABRA LDL-C is now calculated using the Martin-Hopkins  calculation, which is a validated novel method providing  better accuracy than the Friedewald equation in the  estimation of LDL-C.  Gladis APPLETHWAITE et al. SANDREA. 7986;689(80): 2061-2068  (http://education.[facility].com/faq/FAQ164)    Total CHOL/HDL Ratio 2.6 <5.0 (calc)   Non-HDL Cholesterol (Calc) 74 <869 mg/dL (calc)    Comment: For patients with diabetes plus 1 major ASCVD risk  factor, treating to a non-HDL-C goal of <100 mg/dL  (LDL-C of <29 mg/dL) is considered a therapeutic  option.   Comprehensive metabolic panel with GFR     Status: Abnormal   Collection Time: 06/28/24  3:41 PM  Result Value Ref Range   Glucose, Bld 133 (H) 65 - 99 mg/dL    Comment: .            Fasting reference interval . For someone without known diabetes, a glucose value >125 mg/dL indicates that they may have diabetes and this should be confirmed with a follow-up test. .    BUN 14 7 - 25 mg/dL   Creat 8.21 (H) 9.29 - 1.22 mg/dL   eGFR 38 (L) > OR = 60  mL/min/1.1m2   BUN/Creatinine Ratio 8 6 - 22 (calc)   Sodium 143 135 - 146 mmol/L   Potassium 4.6 3.5 - 5.3 mmol/L   Chloride 103 98 - 110 mmol/L   CO2 20 20 - 32 mmol/L   Calcium  10.6 (H) 8.6 - 10.3 mg/dL   Total Protein 8.2 (H) 6.1 - 8.1 g/dL   Albumin 4.7 3.6 - 5.1 g/dL   Globulin 3.5 1.9 - 3.7 g/dL (calc)   AG Ratio 1.3 1.0 - 2.5 (calc)   Total Bilirubin 0.4 0.2 - 1.2 mg/dL   Alkaline phosphatase (APISO) 86 35 - 144 U/L   AST 22 10 - 35 U/L   ALT 23 9 - 46 U/L  Magnesium      Status: None   Collection Time:  06/28/24  3:41 PM  Result Value Ref Range   Magnesium  2.2 1.5 - 2.5 mg/dL  Phosphorus     Status: None   Collection Time: 06/28/24  3:41 PM  Result Value Ref Range   Phosphorus 3.5 2.1 - 4.3 mg/dL  TSH     Status: None   Collection Time: 06/28/24  3:41 PM  Result Value Ref Range   TSH 1.08 0.40 - 4.50 mIU/L  B12 and Folate Panel     Status: None   Collection Time: 06/28/24  3:41 PM  Result Value Ref Range   Vitamin B-12 733 200 - 1,100 pg/mL   Folate >24.0 ng/mL    Comment:                            Reference Range                            Low:           <3.4                            Borderline:    3.4-5.4                            Normal:        >5.4 .   TEST AUTHORIZATION     Status: None   Collection Time: 06/28/24  3:41 PM  Result Value Ref Range   TEST NAME: COMPREHENSIVE METABOLIC MAGNE    TEST CODE: [id number]    CLIENT CONTACT: ANGELICA L RLN    REPORT ALWAYS MESSAGE SIGNATURE      Comment: . The laboratory testing on this patient was verbally requested or confirmed by the ordering physician or his or her authorized representative after contact with an employee of [employer]. Federal regulations require that we maintain on file written authorization for all laboratory testing.  Accordingly we are asking that the ordering physician or his or her authorized representative sign a copy of this report and promptly return it to the client service representative. . . Signature:____________________________________________________ . Please fax this signed page to [phone] or return it via your [employer] courier.   Pro b natriuretic peptide (BNP)     Status: None   Collection Time: 06/28/24  3:41 PM  Result Value Ref Range   NT-Pro BNP CANCELED pg/mL    Comment: Test not performed. No serum received. The following cut-points have been suggested for the use of proBNP for the diagnostic evaluation of  heart failure (HF) in  patients with acute dyspnea: Modality                     Age           Optimal Cut                            (years)            Point ------------------------------------------------------ Diagnosis (rule in HF)        <50            450 pg/mL                           50 - 75            900 pg/mL                               >75           1800 pg/mL Exclusion (rule out HF)  Age independent     300 pg/mL  Result canceled by the ancillary.   Lipid panel     Status: None   Collection Time: 06/30/24 10:38 AM  Result Value Ref Range   Cholesterol 106 0 - 200 mg/dL    Comment: ATP III Classification       Desirable:  < 200 mg/dL               Borderline High:  200 - 239 mg/dL          High:  > = 759 mg/dL   Triglycerides 888.9 0.0 - 149.0 mg/dL    Comment: Normal:  <849 mg/dLBorderline High:  150 - 199 mg/dL   HDL 58.99 >60.99 mg/dL   VLDL 77.7 0.0 - 59.9 mg/dL   LDL Cholesterol 43 0 - 99 mg/dL   Total CHOL/HDL Ratio 3     Comment:                Men          Women1/2 Average Risk     3.4          3.3Average Risk          5.0          4.42X Average Risk          9.6          7.13X Average Risk          15.0          11.0                       NonHDL 65.36     Comment: NOTE:  Non-HDL goal should be 30 mg/dL higher than patient's LDL goal (i.e. LDL goal of < 70 mg/dL, would have non-HDL goal of < 100 mg/dL)  Phosphorus     Status: None   Collection Time: 06/30/24 10:38 AM  Result Value Ref Range   Phosphorus 3.2 2.3 - 4.6 mg/dL  TSH     Status: None   Collection Time: 06/30/24 10:38 AM  Result Value Ref Range   TSH 1.45 0.35 - 5.50 uIU/mL  Pro b natriuretic peptide     Status: None   Collection Time: 06/30/24 10:38 AM  Result Value Ref Range   NT-Pro  BNP 117 0 - 486 pg/mL    Comment: The following cut-points have been suggested for the use of proBNP for the diagnostic evaluation of heart failure (HF) in patients with acute dyspnea: Modality                     Age            Optimal Cut                            (years)            Point ------------------------------------------------------ Diagnosis (rule in HF)        <50            450 pg/mL                           50 - 75            900 pg/mL                               >75           1800 pg/mL Exclusion (rule out HF)  Age independent     300 pg/mL   Microalbumin / creatinine urine ratio     Status: Abnormal   Collection Time: 06/30/24 10:38 AM  Result Value Ref Range   Microalb, Ur 10.6 (H) 0.0 - 1.9 mg/dL   Creatinine,U 884.4 mg/dL   Microalb Creat Ratio 91.4 (H) 0.0 - 30.0 mg/g  Magnesium      Status: None   Collection Time: 06/30/24 10:38 AM  Result Value Ref Range   Magnesium  1.8 1.5 - 2.5 mg/dL  Hemoglobin J8r     Status: Abnormal   Collection Time: 06/30/24 10:38 AM  Result Value Ref Range   Hgb A1c MFr Bld 8.3 (H) 4.6 - 6.5 %    Comment: Glycemic Control Guidelines for People with Diabetes:Non Diabetic:  <6%Goal of Therapy: <7%Additional Action Suggested:  >8%   Comprehensive metabolic panel with GFR     Status: Abnormal   Collection Time: 06/30/24 10:38 AM  Result Value Ref Range   Sodium 139 135 - 145 mEq/L   Potassium 4.0 3.5 - 5.1 mEq/L   Chloride 101 96 - 112 mEq/L   CO2 27 19 - 32 mEq/L   Glucose, Bld 160 (H) 70 - 99 mg/dL   BUN 13 6 - 23 mg/dL   Creatinine, Ser 8.40 (H) 0.40 - 1.50 mg/dL   Total Bilirubin 0.6 0.2 - 1.2 mg/dL   Alkaline Phosphatase 75 39 - 117 U/L   AST 17 0 - 37 U/L   ALT 21 0 - 53 U/L   Total Protein 7.3 6.0 - 8.3 g/dL   Albumin 4.3 3.5 - 5.2 g/dL   GFR 59.89 (L) >39.99 mL/min    Comment: Calculated using the CKD-EPI Creatinine Equation (2021)   Calcium  9.6 8.4 - 10.5 mg/dL  CBC with Differential/Platelet     Status: Abnormal   Collection Time: 06/30/24 10:38 AM  Result Value Ref Range   WBC 5.4 4.0 - 10.5 K/uL   RBC 4.63 4.22 - 5.81 Mil/uL   Hemoglobin 13.2 13.0 - 17.0 g/dL   HCT 59.7 60.9 - 47.9 %   MCV 86.7 78.0 - 100.0 fl   MCHC 32.8 30.0 -  36.0 g/dL  RDW 15.6 (H) 11.5 - 15.5 %   Platelets 235.0 150.0 - 400.0 K/uL   Neutrophils Relative % 50.6 43.0 - 77.0 %   Lymphocytes Relative 34.5 12.0 - 46.0 %   Monocytes Relative 5.9 3.0 - 12.0 %   Eosinophils Relative 8.1 (H) 0.0 - 5.0 %   Basophils Relative 0.9 0.0 - 3.0 %   Neutro Abs 2.7 1.4 - 7.7 K/uL   Lymphs Abs 1.9 0.7 - 4.0 K/uL   Monocytes Absolute 0.3 0.1 - 1.0 K/uL   Eosinophils Absolute 0.4 0.0 - 0.7 K/uL   Basophils Absolute 0.0 0.0 - 0.1 K/uL  B12 and Folate Panel     Status: None   Collection Time: 06/30/24 10:38 AM  Result Value Ref Range   Vitamin B-12 491 211 - 911 pg/mL   Folate >22.9 >5.9 ng/mL        Beverley KATHEE Hummer, MD  I,Emily Lagle,acting as a scribe for Beverley KATHEE Hummer, MD.,have documented all relevant documentation on the behalf of Beverley KATHEE Hummer, MD.  LILLETTE Beverley KATHEE Hummer, MD, have reviewed all documentation for this visit. The documentation on 08/02/2024 for the exam, diagnosis, procedures, and orders are all accurate and complete.

## 2024-08-31 ENCOUNTER — Inpatient Hospital Stay

## 2024-09-07 ENCOUNTER — Ambulatory Visit: Admitting: Hematology & Oncology

## 2024-09-15 ENCOUNTER — Ambulatory Visit

## 2024-09-15 DIAGNOSIS — Z0000 Encounter for general adult medical examination without abnormal findings: Secondary | ICD-10-CM | POA: Diagnosis not present

## 2024-09-15 NOTE — Patient Instructions (Signed)
 Michael Hudson,  Thank you for taking the time for your Medicare Wellness Visit. I appreciate your continued commitment to your health goals. Please review the care plan we discussed, and feel free to reach out if I can assist you further.  Please note that Annual Wellness Visits do not include a physical exam. Some assessments may be limited, especially if the visit was conducted virtually. If needed, we may recommend an in-person follow-up with your provider.  Ongoing Care Seeing your primary care provider every 3 to 6 months helps us  monitor your health and provide consistent, personalized care.   Referrals If a referral was made during today's visit and you haven't received any updates within two weeks, please contact the referred provider directly to check on the status.  Recommended Screenings:  Health Maintenance  Topic Date Due   Zoster (Shingles) Vaccine (1 of 2) Never done   Eye exam for diabetics  05/27/2022   COVID-19 Vaccine (5 - 2025-26 season) 05/29/2024   Medicare Annual Wellness Visit  09/05/2024   Pneumococcal Vaccine for age over 46 (1 of 2 - PCV) 11/08/2024*   Complete foot exam   11/08/2024   Hemoglobin A1C  12/29/2024   Yearly kidney function blood test for diabetes  06/30/2025   Yearly kidney health urinalysis for diabetes  06/30/2025   DTaP/Tdap/Td vaccine (2 - Td or Tdap) 03/16/2030   Flu Shot  Completed   Meningitis B Vaccine  Aged Out  *Topic was postponed. The date shown is not the original due date.       09/15/2024   10:53 AM  Advanced Directives  Does Patient Have a Medical Advance Directive? No  Would patient like information on creating a medical advance directive? No - Patient declined    Vision: Annual vision screenings are recommended for early detection of glaucoma, cataracts, and diabetic retinopathy. These exams can also reveal signs of chronic conditions such as diabetes and high blood pressure.  Dental: Annual dental screenings help detect  early signs of oral cancer, gum disease, and other conditions linked to overall health, including heart disease and diabetes.  Please see the attached documents for additional preventive care recommendations.

## 2024-09-15 NOTE — Progress Notes (Signed)
 "  Chief Complaint  Patient presents with   Medicare Wellness     Subjective:   Michael Hudson is a 83 y.o. male who presents for a Medicare Annual Wellness Visit.  Visit info / Clinical Intake: Medicare Wellness Visit Type:: Subsequent Annual Wellness Visit Persons participating in visit and providing information:: patient & caregiver Medicare Wellness Visit Mode:: Telephone If telephone:: video declined Since this visit was completed virtually, some vitals may be partially provided or unavailable. Missing vitals are due to the limitations of the virtual format.: Unable to obtain vitals - no equipment If Telephone or Video please confirm:: I connected with patient using audio/video enable telemedicine. I verified patient identity with two identifiers, discussed telehealth limitations, and patient agreed to proceed. Patient Location:: home Provider Location:: office Interpreter Needed?: No Pre-visit prep was completed: yes AWV questionnaire completed by patient prior to visit?: no Living arrangements:: lives with spouse/significant other Patient's Overall Health Status Rating: good Typical amount of pain: none Does pain affect daily life?: no Are you currently prescribed opioids?: no  Dietary Habits and Nutritional Risks How many meals a day?: 3 Eats fruit and vegetables daily?: yes Most meals are obtained by: preparing own meals; eating out In the last 2 weeks, have you had any of the following?: none Diabetic:: (!) yes Any non-healing wounds?: no How often do you check your BS?: 3 Would you like to be referred to a Nutritionist or for Diabetic Management? : no  Functional Status Activities of Daily Living (to include ambulation/medication): Independent Ambulation: Independent Medication Administration: Independent Home Management (perform basic housework or laundry): Independent Manage your own finances?: yes Primary transportation is: driving Concerns about vision?:  no *vision screening is required for WTM* Concerns about hearing?: no  Fall Screening Falls in the past year?: 0 Number of falls in past year: 0 Was there an injury with Fall?: 0 Fall Risk Category Calculator: 0 Patient Fall Risk Level: Low Fall Risk  Fall Risk Patient at Risk for Falls Due to: Medication side effect Fall risk Follow up: Falls prevention discussed; Falls evaluation completed  Home and Transportation Safety: All rugs have non-skid backing?: yes All stairs or steps have railings?: yes Grab bars in the bathtub or shower?: (!) no Have non-skid surface in bathtub or shower?: yes Good home lighting?: yes Regular seat belt use?: yes Hospital stays in the last year:: no  Cognitive Assessment Difficulty concentrating, remembering, or making decisions? : no Will 6CIT or Mini Cog be Completed: yes What year is it?: 0 points What month is it?: 0 points Give patient an address phrase to remember (5 components): [address] About what time is it?: 0 points Count backwards from 20 to 1: 0 points Say the months of the year in reverse: 0 points Repeat the address phrase from earlier: 4 points 6 CIT Score: 4 points  Advance Directives (For Healthcare) Does Patient Have a Medical Advance Directive?: No Would patient like information on creating a medical advance directive?: No - Patient declined  Reviewed/Updated  Reviewed/Updated: Reviewed All (Medical, Surgical, Family, Medications, Allergies, Care Teams, Patient Goals)    Allergies (verified) Shellfish allergy, Neosporin [neomycin-bacitracin zn-polymyx], and Zinc   Current Medications (verified) Outpatient Encounter Medications as of 09/15/2024  Medication Sig   amLODipine  (NORVASC ) 2.5 MG tablet Take 1 tablet (2.5 mg total) by mouth daily. To be taken with 5 mg tablet for total of 7.5 mg daily   amLODipine  (NORVASC ) 5 MG tablet Take 1 tablet (5  mg total) by mouth daily.   Ascorbic Acid (VITAMIN C PO)  Take by mouth. daily   dapagliflozin  propanediol (FARXIGA ) 10 MG TABS tablet Take 1 tablet (10 mg total) by mouth daily before breakfast.   Ferrous Sulfate (IRON) 325 (65 Fe) MG TABS Take 1 tablet by mouth daily.   furosemide  (LASIX ) 20 MG tablet Take 1 tablet (20 mg total) by mouth daily.   glipiZIDE (GLUCOTROL) 10 MG tablet TAKE 1 TABLET BY MOUTH TWICE A DAY BEFORE MEALS   glucose blood (ONETOUCH VERIO) test strip USE 1 STRIP THREE TIMES DAILY AS DIRECTED   losartan  (COZAAR ) 100 MG tablet Take 1 tablet (100 mg total) by mouth daily.   metFORMIN  (GLUCOPHAGE ) 500 MG tablet Take 2 tablets (1,000 mg total) by mouth 2 (two) times daily with a meal.   potassium chloride  (KLOR-CON ) 10 MEQ tablet Take 2 tablets (20 mEq total) by mouth daily.   rosuvastatin  (CRESTOR ) 20 MG tablet Take 1 tablet (20 mg total) by mouth daily.   aspirin  EC 81 MG tablet Take 81 mg by mouth daily. (Patient not taking: Reported on 09/15/2024)   Cyanocobalamin  2500 MCG CHEW Chew 2,500 mcg by mouth daily. (Patient not taking: Reported on 09/15/2024)   No facility-administered encounter medications on file as of 09/15/2024.    History: Past Medical History:  Diagnosis Date   Abnormal EKG 03/25/2016   Benign tumor of skin    CVA (cerebral infarction) 03/24/2016   Diabetes mellitus without complication (HCC)    Enlarged prostate    Hypercholesterolemia    Hyperlipidemia    Hypertension    Hypertrophy of prostate    with urinary obstruction and other lower urinary tract   Hypokalemia 03/25/2016   Left-sided weakness 03/24/2016   LVH (left ventricular hypertrophy)    Primary osteoarthritis involving multiple joints    TIA (transient ischemic attack) 03/24/2016   Type 2 diabetes mellitus without complication, without long-term current use of insulin  [facility])    Past Surgical History:  Procedure Laterality Date   UPPER GI ENDOSCOPY  08/17/2022   See scanned document   Family History  Problem Relation Age of Onset   Other  Mother        natural cases   Other Father        hx unkown   Healthy Sister    Healthy Brother    Healthy Brother    Social History   Occupational History   Not on file  Tobacco Use   Smoking status: Never    Passive exposure: Never   Smokeless tobacco: Never  Vaping Use   Vaping status: Never Used  Substance and Sexual Activity   Alcohol use: No   Drug use: No   Sexual activity: Not on file    Comment: married   Tobacco Counseling Counseling given: Not Answered  SDOH Screenings   Food Insecurity: No Food Insecurity (09/15/2024)  Housing: Unknown (09/15/2024)  Transportation Needs: No Transportation Needs (09/15/2024)  Utilities: Not At Risk (09/15/2024)  Alcohol Screen: Low Risk (09/15/2024)  Depression (PHQ2-9): Low Risk (09/15/2024)  Financial Resource Strain: Low Risk (09/15/2024)  Physical Activity: Sufficiently Active (09/15/2024)  Social Connections: Unknown (09/15/2024)  Stress: No Stress Concern Present (09/15/2024)  Tobacco Use: Low Risk (09/15/2024)  Health Literacy: Adequate Health Literacy (09/15/2024)   See flowsheets for full screening details  Depression Screen PHQ 2 & 9 Depression Scale- Over the past 2 weeks, how often have you been bothered by any of the following problems? Little interest  or pleasure in doing things: 0 Feeling down, depressed, or hopeless (PHQ Adolescent also includes...irritable): 0 PHQ-2 Total Score: 0 Trouble falling or staying asleep, or sleeping too much: 0 Feeling tired or having little energy: 0 Poor appetite or overeating (PHQ Adolescent also includes...weight loss): 0 Feeling bad about yourself - or that you are a failure or have let yourself or your family down: 0 Trouble concentrating on things, such as reading the newspaper or watching television (PHQ Adolescent also includes...like school work): 0 Moving or speaking so slowly that other people could have noticed. Or the opposite - being so fidgety or restless  that you have been moving around a lot more than usual: 0 Thoughts that you would be better off dead, or of hurting yourself in some way: 0 PHQ-9 Total Score: 0 If you checked off any problems, how difficult have these problems made it for you to do your work, take care of things at home, or get along with other people?: Not difficult at all  Depression Treatment Depression Interventions/Treatment : EYV7-0 Score <4 Follow-up Not Indicated     Goals Addressed             This Visit's Progress    Patient Stated       09/15/2024, stay healthy             Objective:    Today's Vitals   There is no height or weight on file to calculate BMI.  Hearing/Vision screen Hearing Screening - Comments:: Denies hearing issues Vision Screening - Comments:: Regular eye exams Immunizations and Health Maintenance Health Maintenance  Topic Date Due   Zoster Vaccines- Shingrix (1 of 2) Never done   OPHTHALMOLOGY EXAM  05/27/2022   COVID-19 Vaccine (5 - 2025-26 season) 05/29/2024   Pneumococcal Vaccine: 50+ Years (1 of 2 - PCV) 11/08/2024 (Originally 09/12/1960)   FOOT EXAM  11/08/2024   HEMOGLOBIN A1C  12/29/2024   Diabetic kidney evaluation - eGFR measurement  06/30/2025   Diabetic kidney evaluation - Urine ACR  06/30/2025   Medicare Annual Wellness (AWV)  09/15/2025   DTaP/Tdap/Td (2 - Td or Tdap) 03/16/2030   Influenza Vaccine  Completed   Meningococcal B Vaccine  Aged Out        Assessment/Plan:  This is a routine wellness examination for Elsie.  Patient Care Team: Sebastian Beverley NOVAK, MD as PCP - General (Family Medicine) Sheena Pugh, DO as PCP - Cardiology (Cardiology) Tobb, Kardie, DO as Consulting Physician (Cardiology) Timmy Maude SAUNDERS, MD as Consulting Physician (Oncology) Silva Juliene SAUNDERS, DPM as Consulting Physician (Podiatry)  I have personally reviewed and noted the following in the patients chart:   Medical and social history Use of alcohol, tobacco or  illicit drugs  Current medications and supplements including opioid prescriptions. Functional ability and status Nutritional status Physical activity Advanced directives List of other physicians Hospitalizations, surgeries, and ER visits in previous 12 months Vitals Screenings to include cognitive, depression, and falls Referrals and appointments  No orders of the defined types were placed in this encounter.  In addition, I have reviewed and discussed with patient certain preventive protocols, quality metrics, and best practice recommendations. A written personalized care plan for preventive services as well as general preventive health recommendations were provided to patient.   Ardella FORBES Dawn, LPN   [id number]   Return in 1 year (on 09/15/2025).  After Visit Summary: (MyChart) Due to this being a telephonic visit, the after visit summary with patients personalized plan was  offered to patient via MyChart   Nurse Notes: HM Addressed: Vaccines Due: covid and shingles Patient is due for a diabetic eye exam  "

## 2024-09-22 ENCOUNTER — Other Ambulatory Visit: Payer: Self-pay | Admitting: *Deleted

## 2024-09-22 ENCOUNTER — Telehealth: Payer: Self-pay | Admitting: Cardiology

## 2024-09-22 MED ORDER — AMLODIPINE BESYLATE 2.5 MG PO TABS: 2.5000 mg | ORAL_TABLET | Freq: Every day | ORAL | 1 refills | Status: AC

## 2024-09-22 NOTE — Telephone Encounter (Signed)
" °*  STAT* If patient is at the pharmacy, call can be transferred to refill team.   1. Which medications need to be refilled? (please list name of each medication and dose if known) amLODipine  (NORVASC ) 2.5 MG tablet    2. Would you like to learn more about the convenience, safety, & potential cost savings by using the [facility]?   3. Are you open to using the [facility] (Type [facility].    4. Which pharmacy/location (including street and city if local pharmacy) is medication to be sent to? [id number] - [place], [place] - [address]     5. Do they need a 30 day or 90 day supply? Fill until appt  02/13 "

## 2024-10-08 ENCOUNTER — Other Ambulatory Visit: Payer: Self-pay | Admitting: Cardiology

## 2024-10-16 ENCOUNTER — Other Ambulatory Visit: Payer: Self-pay | Admitting: Family Medicine

## 2024-10-16 DIAGNOSIS — E119 Type 2 diabetes mellitus without complications: Secondary | ICD-10-CM

## 2024-10-31 NOTE — Telephone Encounter (Signed)
 Patient was identified as falling into the [place] Measure - Diabetes.   Patient was: Appointment already scheduled for:  11/16/24.

## 2024-11-09 ENCOUNTER — Other Ambulatory Visit

## 2024-11-10 ENCOUNTER — Ambulatory Visit: Admitting: Physician Assistant

## 2024-11-16 ENCOUNTER — Ambulatory Visit: Admitting: Family Medicine

## 2025-09-18 ENCOUNTER — Ambulatory Visit
